# Patient Record
Sex: Female | Born: 1965 | Race: Black or African American | Hispanic: No | Marital: Single | Smoking: Former smoker
Health system: Southern US, Community
[De-identification: ages and names within clinical notes are randomized; demographics above are authoritative.]

## PROBLEM LIST (undated history)

## (undated) DIAGNOSIS — Z21 Asymptomatic human immunodeficiency virus [HIV] infection status: Secondary | ICD-10-CM

## (undated) DIAGNOSIS — M1990 Unspecified osteoarthritis, unspecified site: Secondary | ICD-10-CM

## (undated) DIAGNOSIS — B20 Human immunodeficiency virus [HIV] disease: Secondary | ICD-10-CM

## (undated) DIAGNOSIS — M791 Myalgia, unspecified site: Secondary | ICD-10-CM

## (undated) DIAGNOSIS — M2550 Pain in unspecified joint: Secondary | ICD-10-CM

## (undated) DIAGNOSIS — M25551 Pain in right hip: Secondary | ICD-10-CM

## (undated) DIAGNOSIS — I10 Essential (primary) hypertension: Secondary | ICD-10-CM

## (undated) DIAGNOSIS — M25552 Pain in left hip: Secondary | ICD-10-CM

## (undated) DIAGNOSIS — E7800 Pure hypercholesterolemia, unspecified: Secondary | ICD-10-CM

## (undated) HISTORY — DX: Pain in left hip: M25.552

## (undated) HISTORY — DX: Essential (primary) hypertension: I10

## (undated) HISTORY — DX: Pain in unspecified joint: M25.50

## (undated) HISTORY — DX: Pain in right hip: M25.551

## (undated) HISTORY — DX: Unspecified osteoarthritis, unspecified site: M19.90

## (undated) HISTORY — DX: Myalgia, unspecified site: M79.10

---

## 1997-07-05 ENCOUNTER — Encounter: Admission: RE | Admit: 1997-07-05 | Discharge: 1997-07-05 | Payer: Self-pay | Admitting: Infectious Diseases

## 1997-07-13 ENCOUNTER — Encounter: Admission: RE | Admit: 1997-07-13 | Discharge: 1997-07-13 | Payer: Self-pay | Admitting: Infectious Diseases

## 1997-08-16 ENCOUNTER — Encounter: Admission: RE | Admit: 1997-08-16 | Discharge: 1997-08-16 | Payer: Self-pay | Admitting: Infectious Diseases

## 1997-09-13 ENCOUNTER — Encounter: Admission: RE | Admit: 1997-09-13 | Discharge: 1997-09-13 | Payer: Self-pay | Admitting: Infectious Diseases

## 1997-11-22 ENCOUNTER — Ambulatory Visit (HOSPITAL_COMMUNITY): Admission: RE | Admit: 1997-11-22 | Discharge: 1997-11-22 | Payer: Self-pay | Admitting: Infectious Diseases

## 1997-11-22 ENCOUNTER — Encounter: Admission: RE | Admit: 1997-11-22 | Discharge: 1997-11-22 | Payer: Self-pay | Admitting: Infectious Diseases

## 2006-11-02 ENCOUNTER — Encounter: Payer: Self-pay | Admitting: Internal Medicine

## 2006-11-02 DIAGNOSIS — B20 Human immunodeficiency virus [HIV] disease: Secondary | ICD-10-CM | POA: Insufficient documentation

## 2006-11-05 ENCOUNTER — Ambulatory Visit: Payer: Self-pay | Admitting: Internal Medicine

## 2006-11-05 ENCOUNTER — Encounter: Admission: RE | Admit: 2006-11-05 | Discharge: 2006-11-05 | Payer: Self-pay | Admitting: Internal Medicine

## 2006-11-05 LAB — CONVERTED CEMR LAB
HIV 1 RNA Quant: 50100 copies/mL — ABNORMAL HIGH (ref <50)
HIV-1 RNA Quant, Log: 4.7 — ABNORMAL HIGH (ref <1.70)

## 2006-11-06 ENCOUNTER — Telehealth: Payer: Self-pay | Admitting: Internal Medicine

## 2006-11-11 LAB — CONVERTED CEMR LAB
ALT: 8 units/L (ref 0–35)
AST: 20 units/L (ref 0–37)
Albumin: 4 g/dL (ref 3.5–5.2)
Alkaline Phosphatase: 55 units/L (ref 39–117)
BUN: 10 mg/dL (ref 6–23)
Bacteria, UA: NONE SEEN
Basophils Absolute: 0 10*3/uL (ref 0.0–0.1)
Basophils Relative: 0 % (ref 0–1)
Bilirubin Urine: NEGATIVE
CO2: 16 meq/L — ABNORMAL LOW (ref 19–32)
Calcium: 8.4 mg/dL (ref 8.4–10.5)
Chlamydia, Swab/Urine, PCR: NEGATIVE
Chloride: 106 meq/L (ref 96–112)
Cholesterol: 131 mg/dL (ref 0–200)
Creatinine, Ser: 0.74 mg/dL (ref 0.40–1.20)
Eosinophils Absolute: 0.1 10*3/uL (ref 0.0–0.7)
Eosinophils Relative: 4 % (ref 0–5)
GC Probe Amp, Urine: NEGATIVE
Glucose, Bld: 93 mg/dL (ref 70–99)
HCT: 23.5 % — ABNORMAL LOW (ref 36.0–46.0)
HCV Ab: NEGATIVE
HDL: 33 mg/dL — ABNORMAL LOW (ref >39)
HIV-1 antibody: POSITIVE — AB
HIV-2 Ab: UNDETERMINED — AB
HIV: REACTIVE
Hemoglobin, Urine: NEGATIVE
Hemoglobin: 6.3 g/dL — CL (ref 12.0–15.0)
Hep B Core Total Ab: NEGATIVE
Hep B S Ab: NEGATIVE
Hepatitis B Surface Ag: NEGATIVE
Ketones, ur: NEGATIVE mg/dL
LDL Cholesterol: 62 mg/dL (ref 0–99)
Lymphocytes Relative: 17 % (ref 12–46)
Lymphs Abs: 0.6 10*3/uL — ABNORMAL LOW (ref 0.7–3.3)
MCHC: 26.8 g/dL — ABNORMAL LOW (ref 30.0–36.0)
MCV: 63.7 fL — ABNORMAL LOW (ref 78.0–100.0)
Monocytes Absolute: 0.3 10*3/uL (ref 0.2–0.7)
Monocytes Relative: 10 % (ref 3–11)
Neutro Abs: 2.2 10*3/uL (ref 1.7–7.7)
Neutrophils Relative %: 69 % (ref 43–77)
Nitrite: NEGATIVE
Platelets: 423 10*3/uL — ABNORMAL HIGH (ref 150–400)
Potassium: 4 meq/L (ref 3.5–5.3)
Protein, ur: NEGATIVE mg/dL
RBC: 3.69 M/uL — ABNORMAL LOW (ref 3.87–5.11)
RDW: 21 % — ABNORMAL HIGH (ref 11.5–14.0)
Sodium: 134 meq/L — ABNORMAL LOW (ref 135–145)
Specific Gravity, Urine: 1.019 (ref 1.005–1.03)
Total Bilirubin: 0.2 mg/dL — ABNORMAL LOW (ref 0.3–1.2)
Total CHOL/HDL Ratio: 4
Total Protein: 10.2 g/dL — ABNORMAL HIGH (ref 6.0–8.3)
Triglycerides: 178 mg/dL — ABNORMAL HIGH (ref <150)
Urine Glucose: NEGATIVE mg/dL
Urobilinogen, UA: 0.2 (ref 0.0–1.0)
VLDL: 36 mg/dL (ref 0–40)
WBC: 3.2 10*3/uL — ABNORMAL LOW (ref 4.0–10.5)
pH: 5.5 (ref 5.0–8.0)

## 2006-11-17 ENCOUNTER — Telehealth: Payer: Self-pay | Admitting: Internal Medicine

## 2006-11-18 ENCOUNTER — Encounter: Payer: Self-pay | Admitting: Internal Medicine

## 2006-11-23 ENCOUNTER — Encounter (INDEPENDENT_AMBULATORY_CARE_PROVIDER_SITE_OTHER): Payer: Self-pay | Admitting: *Deleted

## 2006-11-26 ENCOUNTER — Telehealth: Payer: Self-pay | Admitting: Internal Medicine

## 2006-11-27 ENCOUNTER — Encounter: Payer: Self-pay | Admitting: Internal Medicine

## 2006-11-27 ENCOUNTER — Ambulatory Visit: Payer: Self-pay | Admitting: Internal Medicine

## 2006-11-27 DIAGNOSIS — D509 Iron deficiency anemia, unspecified: Secondary | ICD-10-CM

## 2006-12-02 ENCOUNTER — Encounter: Payer: Self-pay | Admitting: Internal Medicine

## 2006-12-03 ENCOUNTER — Encounter (INDEPENDENT_AMBULATORY_CARE_PROVIDER_SITE_OTHER): Payer: Self-pay | Admitting: *Deleted

## 2006-12-03 LAB — CONVERTED CEMR LAB: Pap Smear: NORMAL

## 2006-12-11 ENCOUNTER — Encounter: Payer: Self-pay | Admitting: Internal Medicine

## 2006-12-11 ENCOUNTER — Encounter (INDEPENDENT_AMBULATORY_CARE_PROVIDER_SITE_OTHER): Payer: Self-pay | Admitting: *Deleted

## 2007-01-06 ENCOUNTER — Ambulatory Visit: Payer: Self-pay | Admitting: Internal Medicine

## 2007-01-06 ENCOUNTER — Encounter: Admission: RE | Admit: 2007-01-06 | Discharge: 2007-01-06 | Payer: Self-pay | Admitting: Internal Medicine

## 2007-01-06 LAB — CONVERTED CEMR LAB
HIV 1 RNA Quant: 844 copies/mL — ABNORMAL HIGH (ref <50)
HIV-1 RNA Quant, Log: 2.93 — ABNORMAL HIGH (ref <1.70)

## 2007-01-07 LAB — CONVERTED CEMR LAB
ALT: 10 units/L (ref 0–35)
AST: 17 units/L (ref 0–37)
Albumin: 3.7 g/dL (ref 3.5–5.2)
Alkaline Phosphatase: 58 units/L (ref 39–117)
BUN: 11 mg/dL (ref 6–23)
Basophils Absolute: 0 10*3/uL (ref 0.0–0.1)
Basophils Relative: 0 % (ref 0–1)
CO2: 19 meq/L (ref 19–32)
Calcium: 8.3 mg/dL — ABNORMAL LOW (ref 8.4–10.5)
Chloride: 108 meq/L (ref 96–112)
Creatinine, Ser: 0.86 mg/dL (ref 0.40–1.20)
Eosinophils Absolute: 0.1 10*3/uL — ABNORMAL LOW (ref 0.2–0.7)
Eosinophils Relative: 3 % (ref 0–5)
Glucose, Bld: 80 mg/dL (ref 70–99)
HCT: 29.3 % — ABNORMAL LOW (ref 36.0–46.0)
Hemoglobin: 8.5 g/dL — ABNORMAL LOW (ref 12.0–15.0)
Lymphocytes Relative: 31 % (ref 12–46)
Lymphs Abs: 0.9 10*3/uL (ref 0.7–4.0)
MCHC: 29 g/dL — ABNORMAL LOW (ref 30.0–36.0)
MCV: 77.5 fL — ABNORMAL LOW (ref 78.0–100.0)
Monocytes Absolute: 0.3 10*3/uL (ref 0.1–1.0)
Monocytes Relative: 11 % (ref 3–12)
Neutro Abs: 1.7 10*3/uL (ref 1.7–7.7)
Neutrophils Relative %: 56 % (ref 43–77)
Platelets: 288 10*3/uL (ref 150–400)
Potassium: 3.4 meq/L — ABNORMAL LOW (ref 3.5–5.3)
RBC: 3.78 M/uL — ABNORMAL LOW (ref 3.87–5.11)
Sodium: 138 meq/L (ref 135–145)
Total Bilirubin: 0.2 mg/dL — ABNORMAL LOW (ref 0.3–1.2)
Total Protein: 8.5 g/dL — ABNORMAL HIGH (ref 6.0–8.3)
WBC: 3.1 10*3/uL — ABNORMAL LOW (ref 4.0–10.5)

## 2007-01-20 ENCOUNTER — Ambulatory Visit: Payer: Self-pay | Admitting: Internal Medicine

## 2007-01-20 ENCOUNTER — Encounter: Payer: Self-pay | Admitting: Internal Medicine

## 2007-01-20 LAB — CONVERTED CEMR LAB
ALT: 8 units/L (ref 0–35)
AST: 18 units/L (ref 0–37)
Albumin: 4.2 g/dL (ref 3.5–5.2)
Alkaline Phosphatase: 68 units/L (ref 39–117)
Bilirubin, Direct: <0.1 mg/dL (ref 0.0–0.3)
CD4 Count: 230 microliters
HIV 1 RNA Quant: 64 copies/mL
Preg, Serum: NEGATIVE
Total Bilirubin: 0.2 mg/dL — ABNORMAL LOW (ref 0.3–1.2)
Total Protein: 9.4 g/dL — ABNORMAL HIGH (ref 6.0–8.3)

## 2007-02-01 ENCOUNTER — Encounter (INDEPENDENT_AMBULATORY_CARE_PROVIDER_SITE_OTHER): Payer: Self-pay | Admitting: *Deleted

## 2007-02-02 ENCOUNTER — Encounter (INDEPENDENT_AMBULATORY_CARE_PROVIDER_SITE_OTHER): Payer: Self-pay | Admitting: *Deleted

## 2007-02-08 ENCOUNTER — Encounter: Payer: Self-pay | Admitting: Internal Medicine

## 2007-02-10 ENCOUNTER — Encounter: Payer: Self-pay | Admitting: Internal Medicine

## 2007-02-19 ENCOUNTER — Ambulatory Visit: Payer: Self-pay | Admitting: Internal Medicine

## 2007-02-20 ENCOUNTER — Encounter: Payer: Self-pay | Admitting: Internal Medicine

## 2007-03-01 ENCOUNTER — Encounter: Payer: Self-pay | Admitting: Internal Medicine

## 2007-03-18 ENCOUNTER — Ambulatory Visit: Payer: Self-pay | Admitting: Internal Medicine

## 2007-04-20 ENCOUNTER — Ambulatory Visit: Payer: Self-pay | Admitting: Internal Medicine

## 2007-04-20 LAB — CONVERTED CEMR LAB
ALT: 12 units/L (ref 0–35)
AST: 14 units/L (ref 0–37)
Albumin: 4.4 g/dL (ref 3.5–5.2)
Alkaline Phosphatase: 55 units/L (ref 39–117)
BUN: 11 mg/dL (ref 6–23)
Beta hcg, urine, semiquantitative: NEGATIVE
CD4 Count: 235 microliters
CO2: 19 meq/L (ref 19–32)
Calcium: 9.2 mg/dL (ref 8.4–10.5)
Chloride: 104 meq/L (ref 96–112)
Creatinine, Ser: 0.88 mg/dL (ref 0.40–1.20)
Glucose, Bld: 90 mg/dL (ref 70–99)
HIV 1 RNA Quant: 97 copies/mL — ABNORMAL HIGH (ref <50)
HIV-1 RNA Quant, Log: 1.99 — ABNORMAL HIGH (ref <1.70)
Potassium: 4 meq/L (ref 3.5–5.3)
Sodium: 137 meq/L (ref 135–145)
Total Bilirubin: 0.2 mg/dL — ABNORMAL LOW (ref 0.3–1.2)
Total Protein: 8.8 g/dL — ABNORMAL HIGH (ref 6.0–8.3)

## 2007-05-05 ENCOUNTER — Encounter: Payer: Self-pay | Admitting: Internal Medicine

## 2007-05-05 ENCOUNTER — Ambulatory Visit: Payer: Self-pay | Admitting: Internal Medicine

## 2007-05-05 ENCOUNTER — Encounter (INDEPENDENT_AMBULATORY_CARE_PROVIDER_SITE_OTHER): Payer: Self-pay | Admitting: *Deleted

## 2007-05-05 DIAGNOSIS — R21 Rash and other nonspecific skin eruption: Secondary | ICD-10-CM | POA: Insufficient documentation

## 2007-05-05 LAB — CONVERTED CEMR LAB
ALT: 9 units/L (ref 0–35)
AST: 14 units/L (ref 0–37)
Albumin: 4.5 g/dL (ref 3.5–5.2)
Alkaline Phosphatase: 53 units/L (ref 39–117)
BUN: 13 mg/dL (ref 6–23)
Bilirubin, Direct: 0.1 mg/dL (ref 0.0–0.3)
CD4 Count: 281 microliters
CO2: 21 meq/L (ref 19–32)
Calcium: 8.8 mg/dL (ref 8.4–10.5)
Chloride: 105 meq/L (ref 96–112)
Creatinine, Ser: 0.86 mg/dL (ref 0.40–1.20)
Glucose, Bld: 91 mg/dL (ref 70–99)
HIV 1 RNA Quant: 49 copies/mL
Indirect Bilirubin: 0.1 mg/dL (ref 0.0–0.9)
Potassium: 4.3 meq/L (ref 3.5–5.3)
Sodium: 136 meq/L (ref 135–145)
Total Bilirubin: 0.2 mg/dL — ABNORMAL LOW (ref 0.3–1.2)
Total Protein: 8.4 g/dL — ABNORMAL HIGH (ref 6.0–8.3)

## 2007-05-06 ENCOUNTER — Telehealth: Payer: Self-pay | Admitting: Internal Medicine

## 2007-06-10 ENCOUNTER — Encounter (INDEPENDENT_AMBULATORY_CARE_PROVIDER_SITE_OTHER): Payer: Self-pay | Admitting: *Deleted

## 2007-08-11 ENCOUNTER — Ambulatory Visit: Payer: Self-pay | Admitting: Internal Medicine

## 2007-09-01 ENCOUNTER — Ambulatory Visit: Payer: Self-pay | Admitting: Internal Medicine

## 2007-09-01 LAB — CONVERTED CEMR LAB
ALT: 9 units/L (ref 0–35)
AST: 13 units/L (ref 0–37)
Albumin: 4.7 g/dL (ref 3.5–5.2)
Alkaline Phosphatase: 59 units/L (ref 39–117)
BUN: 16 mg/dL (ref 6–23)
Bilirubin, Direct: 0.1 mg/dL (ref 0.0–0.3)
CD4 Count: 318 microliters
CO2: 20 meq/L (ref 19–32)
Calcium: 9.1 mg/dL (ref 8.4–10.5)
Chloride: 106 meq/L (ref 96–112)
Creatinine, Ser: 0.88 mg/dL (ref 0.40–1.20)
Glucose, Bld: 95 mg/dL (ref 70–99)
HIV 1 RNA Quant: 49 copies/mL
Indirect Bilirubin: 0.1 mg/dL (ref 0.0–0.9)
Potassium: 4.2 meq/L (ref 3.5–5.3)
Sodium: 136 meq/L (ref 135–145)
Total Bilirubin: 0.2 mg/dL — ABNORMAL LOW (ref 0.3–1.2)
Total Protein: 8.6 g/dL — ABNORMAL HIGH (ref 6.0–8.3)

## 2007-10-07 ENCOUNTER — Encounter: Payer: Self-pay | Admitting: Internal Medicine

## 2007-11-30 ENCOUNTER — Encounter (INDEPENDENT_AMBULATORY_CARE_PROVIDER_SITE_OTHER): Payer: Self-pay | Admitting: *Deleted

## 2007-11-30 ENCOUNTER — Ambulatory Visit: Payer: Self-pay | Admitting: Internal Medicine

## 2007-11-30 LAB — CONVERTED CEMR LAB
ALT: 11 units/L (ref 0–35)
AST: 19 units/L (ref 0–37)
Albumin: 4.5 g/dL (ref 3.5–5.2)
Alkaline Phosphatase: 72 units/L (ref 39–117)
BUN: 13 mg/dL (ref 6–23)
Basophils Absolute: 0 10*3/uL (ref 0.0–0.1)
Basophils Relative: 0 % (ref 0–1)
CO2: 20 meq/L (ref 19–32)
Calcium: 8.8 mg/dL (ref 8.4–10.5)
Chloride: 106 meq/L (ref 96–112)
Creatinine, Ser: 0.87 mg/dL (ref 0.40–1.20)
Eosinophils Absolute: 0.1 10*3/uL (ref 0.0–0.7)
Eosinophils Relative: 3 % (ref 0–5)
Glucose, Bld: 101 mg/dL — ABNORMAL HIGH (ref 70–99)
HCT: 29.9 % — ABNORMAL LOW (ref 36.0–46.0)
HIV 1 RNA Quant: 153 copies/mL — ABNORMAL HIGH (ref <50)
HIV-1 RNA Quant, Log: 2.18 — ABNORMAL HIGH (ref <1.70)
Hemoglobin: 9.8 g/dL — ABNORMAL LOW (ref 12.0–15.0)
Lymphocytes Relative: 43 % (ref 12–46)
Lymphs Abs: 1.3 10*3/uL (ref 0.7–4.0)
MCHC: 32.8 g/dL (ref 30.0–36.0)
MCV: 83.8 fL (ref 78.0–100.0)
Monocytes Absolute: 0.4 10*3/uL (ref 0.1–1.0)
Monocytes Relative: 12 % (ref 3–12)
Neutro Abs: 1.3 10*3/uL — ABNORMAL LOW (ref 1.7–7.7)
Neutrophils Relative %: 42 % — ABNORMAL LOW (ref 43–77)
Platelets: 309 10*3/uL (ref 150–400)
Potassium: 4 meq/L (ref 3.5–5.3)
RBC: 3.57 M/uL — ABNORMAL LOW (ref 3.87–5.11)
RDW: 16.2 % — ABNORMAL HIGH (ref 11.5–15.5)
Sodium: 137 meq/L (ref 135–145)
Total Bilirubin: 0.2 mg/dL — ABNORMAL LOW (ref 0.3–1.2)
Total Protein: 8.4 g/dL — ABNORMAL HIGH (ref 6.0–8.3)
WBC: 3 10*3/uL — ABNORMAL LOW (ref 4.0–10.5)

## 2007-12-14 ENCOUNTER — Ambulatory Visit: Payer: Self-pay | Admitting: Internal Medicine

## 2008-02-01 ENCOUNTER — Ambulatory Visit: Payer: Self-pay | Admitting: Internal Medicine

## 2008-02-16 ENCOUNTER — Encounter: Payer: Self-pay | Admitting: Internal Medicine

## 2008-02-16 ENCOUNTER — Ambulatory Visit: Payer: Self-pay | Admitting: Internal Medicine

## 2008-02-22 ENCOUNTER — Encounter: Payer: Self-pay | Admitting: Internal Medicine

## 2008-03-08 ENCOUNTER — Encounter (INDEPENDENT_AMBULATORY_CARE_PROVIDER_SITE_OTHER): Payer: Self-pay | Admitting: *Deleted

## 2008-03-13 ENCOUNTER — Ambulatory Visit: Payer: Self-pay | Admitting: Internal Medicine

## 2008-03-13 LAB — CONVERTED CEMR LAB
ALT: 16 units/L (ref 0–35)
AST: 29 units/L (ref 0–37)
Albumin: 4.5 g/dL (ref 3.5–5.2)
Alkaline Phosphatase: 78 units/L (ref 39–117)
BUN: 8 mg/dL (ref 6–23)
Basophils Absolute: 0 10*3/uL (ref 0.0–0.1)
Basophils Relative: 0 % (ref 0–1)
CO2: 17 meq/L — ABNORMAL LOW (ref 19–32)
Calcium: 9 mg/dL (ref 8.4–10.5)
Chloride: 107 meq/L (ref 96–112)
Creatinine, Ser: 0.8 mg/dL (ref 0.40–1.20)
Eosinophils Absolute: 0.1 10*3/uL (ref 0.0–0.7)
Eosinophils Relative: 2 % (ref 0–5)
Glucose, Bld: 86 mg/dL (ref 70–99)
HCT: 31.2 % — ABNORMAL LOW (ref 36.0–46.0)
HIV 1 RNA Quant: 103 copies/mL — ABNORMAL HIGH (ref <48)
HIV-1 RNA Quant, Log: 2.01 — ABNORMAL HIGH (ref <1.68)
Hemoglobin: 10 g/dL — ABNORMAL LOW (ref 12.0–15.0)
Lymphocytes Relative: 27 % (ref 12–46)
Lymphs Abs: 1.3 10*3/uL (ref 0.7–4.0)
MCHC: 32.1 g/dL (ref 30.0–36.0)
MCV: 81.5 fL (ref 78.0–100.0)
Monocytes Absolute: 0.4 10*3/uL (ref 0.1–1.0)
Monocytes Relative: 9 % (ref 3–12)
Neutro Abs: 2.9 10*3/uL (ref 1.7–7.7)
Neutrophils Relative %: 62 % (ref 43–77)
Platelets: 258 10*3/uL (ref 150–400)
Potassium: 4.6 meq/L (ref 3.5–5.3)
RBC: 3.83 M/uL — ABNORMAL LOW (ref 3.87–5.11)
RDW: 17.8 % — ABNORMAL HIGH (ref 11.5–15.5)
Sodium: 137 meq/L (ref 135–145)
Total Bilirubin: 0.3 mg/dL (ref 0.3–1.2)
Total Protein: 8.2 g/dL (ref 6.0–8.3)
WBC: 4.7 10*3/uL (ref 4.0–10.5)

## 2008-03-17 ENCOUNTER — Encounter: Payer: Self-pay | Admitting: *Deleted

## 2008-03-29 ENCOUNTER — Ambulatory Visit: Payer: Self-pay | Admitting: Internal Medicine

## 2008-06-07 ENCOUNTER — Ambulatory Visit: Payer: Self-pay | Admitting: Internal Medicine

## 2008-06-07 LAB — CONVERTED CEMR LAB
ALT: 18 units/L (ref 0–35)
AST: 26 units/L (ref 0–37)
Albumin: 4.5 g/dL (ref 3.5–5.2)
Alkaline Phosphatase: 82 units/L (ref 39–117)
BUN: 10 mg/dL (ref 6–23)
Basophils Absolute: 0 10*3/uL (ref 0.0–0.1)
Basophils Relative: 1 % (ref 0–1)
CO2: 18 meq/L — ABNORMAL LOW (ref 19–32)
Calcium: 8.6 mg/dL (ref 8.4–10.5)
Chloride: 111 meq/L (ref 96–112)
Creatinine, Ser: 0.9 mg/dL (ref 0.40–1.20)
Eosinophils Absolute: 0.1 10*3/uL (ref 0.0–0.7)
Eosinophils Relative: 5 % (ref 0–5)
GFR calc Af Amer: >60 mL/min (ref >60)
GFR calc non Af Amer: >60 mL/min (ref >60)
Glucose, Bld: 81 mg/dL (ref 70–99)
HCT: 30.6 % — ABNORMAL LOW (ref 36.0–46.0)
HIV 1 RNA Quant: 174 copies/mL — ABNORMAL HIGH (ref <48)
HIV-1 RNA Quant, Log: 2.24 — ABNORMAL HIGH (ref <1.68)
Hemoglobin: 10 g/dL — ABNORMAL LOW (ref 12.0–15.0)
Lymphocytes Relative: 38 % (ref 12–46)
Lymphs Abs: 1 10*3/uL (ref 0.7–4.0)
MCHC: 32.7 g/dL (ref 30.0–36.0)
MCV: 79.7 fL (ref 78.0–100.0)
Monocytes Absolute: 0.2 10*3/uL (ref 0.1–1.0)
Monocytes Relative: 9 % (ref 3–12)
Neutro Abs: 1.3 10*3/uL — ABNORMAL LOW (ref 1.7–7.7)
Neutrophils Relative %: 48 % (ref 43–77)
Platelets: 265 10*3/uL (ref 150–400)
Potassium: 4.4 meq/L (ref 3.5–5.3)
RBC: 3.84 M/uL — ABNORMAL LOW (ref 3.87–5.11)
RDW: 19.6 % — ABNORMAL HIGH (ref 11.5–15.5)
Sodium: 136 meq/L (ref 135–145)
Total Bilirubin: 0.2 mg/dL — ABNORMAL LOW (ref 0.3–1.2)
Total Protein: 8.5 g/dL — ABNORMAL HIGH (ref 6.0–8.3)
WBC: 2.6 10*3/uL — ABNORMAL LOW (ref 4.0–10.5)

## 2008-06-23 ENCOUNTER — Ambulatory Visit: Payer: Self-pay | Admitting: Internal Medicine

## 2008-07-07 ENCOUNTER — Ambulatory Visit: Payer: Self-pay | Admitting: Internal Medicine

## 2008-09-21 ENCOUNTER — Ambulatory Visit: Payer: Self-pay | Admitting: Internal Medicine

## 2008-09-21 LAB — CONVERTED CEMR LAB
ALT: 10 units/L (ref 0–35)
AST: 23 units/L (ref 0–37)
Albumin: 4.4 g/dL (ref 3.5–5.2)
Alkaline Phosphatase: 70 units/L (ref 39–117)
BUN: 10 mg/dL (ref 6–23)
Basophils Absolute: 0 10*3/uL (ref 0.0–0.1)
Basophils Relative: 1 % (ref 0–1)
CO2: 19 meq/L (ref 19–32)
Calcium: 8.4 mg/dL (ref 8.4–10.5)
Chloride: 106 meq/L (ref 96–112)
Cholesterol: 203 mg/dL — ABNORMAL HIGH (ref 0–200)
Creatinine, Ser: 0.85 mg/dL (ref 0.40–1.20)
Eosinophils Absolute: 0.1 10*3/uL (ref 0.0–0.7)
Eosinophils Relative: 1 % (ref 0–5)
Glucose, Bld: 90 mg/dL (ref 70–99)
HCT: 31.4 % — ABNORMAL LOW (ref 36.0–46.0)
HDL: 81 mg/dL (ref >39)
HIV 1 RNA Quant: <48 copies/mL (ref <48)
HIV-1 RNA Quant, Log: <1.68 (ref <1.68)
Hemoglobin: 10 g/dL — ABNORMAL LOW (ref 12.0–15.0)
LDL Cholesterol: 91 mg/dL (ref 0–99)
Lymphocytes Relative: 34 % (ref 12–46)
Lymphs Abs: 1.3 10*3/uL (ref 0.7–4.0)
MCHC: 31.8 g/dL (ref 30.0–36.0)
MCV: 82.2 fL (ref >=78.0)
Monocytes Absolute: 0.3 10*3/uL (ref 0.1–1.0)
Monocytes Relative: 7 % (ref 3–12)
Neutro Abs: 2.2 10*3/uL (ref 1.7–7.7)
Neutrophils Relative %: 58 % (ref 43–77)
Platelets: 329 10*3/uL (ref 150–400)
Potassium: 4.1 meq/L (ref 3.5–5.3)
RBC: 3.82 M/uL — ABNORMAL LOW (ref 3.87–5.11)
RDW: 18.2 % — ABNORMAL HIGH (ref 11.5–15.5)
Sodium: 139 meq/L (ref 135–145)
Total Bilirubin: 0.2 mg/dL — ABNORMAL LOW (ref 0.3–1.2)
Total CHOL/HDL Ratio: 2.5
Total Protein: 8.4 g/dL — ABNORMAL HIGH (ref 6.0–8.3)
Triglycerides: 157 mg/dL — ABNORMAL HIGH (ref <150)
VLDL: 31 mg/dL (ref 0–40)
WBC: 3.8 10*3/uL — ABNORMAL LOW (ref 4.0–10.5)

## 2008-10-06 ENCOUNTER — Ambulatory Visit: Payer: Self-pay | Admitting: Internal Medicine

## 2008-10-06 DIAGNOSIS — R030 Elevated blood-pressure reading, without diagnosis of hypertension: Secondary | ICD-10-CM

## 2008-11-15 ENCOUNTER — Telehealth (INDEPENDENT_AMBULATORY_CARE_PROVIDER_SITE_OTHER): Payer: Self-pay | Admitting: *Deleted

## 2008-12-13 ENCOUNTER — Telehealth (INDEPENDENT_AMBULATORY_CARE_PROVIDER_SITE_OTHER): Payer: Self-pay | Admitting: *Deleted

## 2009-01-11 ENCOUNTER — Telehealth (INDEPENDENT_AMBULATORY_CARE_PROVIDER_SITE_OTHER): Payer: Self-pay | Admitting: *Deleted

## 2009-01-30 ENCOUNTER — Encounter: Payer: Self-pay | Admitting: Internal Medicine

## 2009-02-07 ENCOUNTER — Telehealth (INDEPENDENT_AMBULATORY_CARE_PROVIDER_SITE_OTHER): Payer: Self-pay | Admitting: *Deleted

## 2009-03-02 ENCOUNTER — Encounter: Payer: Self-pay | Admitting: Internal Medicine

## 2009-03-07 ENCOUNTER — Telehealth (INDEPENDENT_AMBULATORY_CARE_PROVIDER_SITE_OTHER): Payer: Self-pay | Admitting: *Deleted

## 2009-03-21 ENCOUNTER — Encounter: Payer: Self-pay | Admitting: Internal Medicine

## 2009-04-10 ENCOUNTER — Telehealth (INDEPENDENT_AMBULATORY_CARE_PROVIDER_SITE_OTHER): Payer: Self-pay | Admitting: *Deleted

## 2009-04-27 ENCOUNTER — Ambulatory Visit: Payer: Self-pay | Admitting: Internal Medicine

## 2009-04-27 LAB — CONVERTED CEMR LAB
ALT: 20 units/L (ref 0–35)
AST: 26 units/L (ref 0–37)
Albumin: 4 g/dL (ref 3.5–5.2)
Alkaline Phosphatase: 61 units/L (ref 39–117)
BUN: 10 mg/dL (ref 6–23)
Basophils Absolute: 0 10*3/uL (ref 0.0–0.1)
Basophils Relative: 0 % (ref 0–1)
CO2: 20 meq/L (ref 19–32)
Calcium: 8.9 mg/dL (ref 8.4–10.5)
Chloride: 101 meq/L (ref 96–112)
Creatinine, Ser: 0.74 mg/dL (ref 0.40–1.20)
Eosinophils Absolute: 0.1 10*3/uL (ref 0.0–0.7)
Eosinophils Relative: 2 % (ref 0–5)
Glucose, Bld: 96 mg/dL (ref 70–99)
HCT: 31.8 % — ABNORMAL LOW (ref 36.0–46.0)
HIV 1 RNA Quant: 90300 copies/mL — ABNORMAL HIGH (ref <48)
HIV-1 RNA Quant, Log: 4.96 — ABNORMAL HIGH (ref <1.68)
Hemoglobin: 10.7 g/dL — ABNORMAL LOW (ref 12.0–15.0)
Lymphocytes Relative: 25 % (ref 12–46)
Lymphs Abs: 0.7 10*3/uL (ref 0.7–4.0)
MCHC: 33.6 g/dL (ref 30.0–36.0)
MCV: 79.9 fL (ref >=78.0)
Monocytes Absolute: 0.3 10*3/uL (ref 0.1–1.0)
Monocytes Relative: 11 % (ref 3–12)
Neutro Abs: 1.6 10*3/uL — ABNORMAL LOW (ref 1.7–7.7)
Neutrophils Relative %: 62 % (ref 43–77)
Platelets: 284 10*3/uL (ref 150–400)
Potassium: 3.9 meq/L (ref 3.5–5.3)
RBC: 3.98 M/uL (ref 3.87–5.11)
RDW: 18.7 % — ABNORMAL HIGH (ref 11.5–15.5)
Sodium: 134 meq/L — ABNORMAL LOW (ref 135–145)
Total Bilirubin: 0.3 mg/dL (ref 0.3–1.2)
Total Protein: 9 g/dL — ABNORMAL HIGH (ref 6.0–8.3)
WBC: 2.7 10*3/uL — ABNORMAL LOW (ref 4.0–10.5)

## 2009-04-30 ENCOUNTER — Encounter (INDEPENDENT_AMBULATORY_CARE_PROVIDER_SITE_OTHER): Payer: Self-pay | Admitting: *Deleted

## 2009-05-24 ENCOUNTER — Encounter (INDEPENDENT_AMBULATORY_CARE_PROVIDER_SITE_OTHER): Payer: Self-pay | Admitting: *Deleted

## 2009-06-01 ENCOUNTER — Encounter (INDEPENDENT_AMBULATORY_CARE_PROVIDER_SITE_OTHER): Payer: Self-pay | Admitting: *Deleted

## 2009-08-01 ENCOUNTER — Ambulatory Visit: Payer: Self-pay | Admitting: Internal Medicine

## 2009-08-07 ENCOUNTER — Telehealth: Payer: Self-pay | Admitting: Internal Medicine

## 2009-09-03 ENCOUNTER — Telehealth: Payer: Self-pay | Admitting: Internal Medicine

## 2009-09-12 ENCOUNTER — Ambulatory Visit: Payer: Self-pay | Admitting: Internal Medicine

## 2009-09-12 LAB — CONVERTED CEMR LAB
ALT: 10 units/L (ref 0–35)
AST: 20 units/L (ref 0–37)
Albumin: 4.2 g/dL (ref 3.5–5.2)
Alkaline Phosphatase: 64 units/L (ref 39–117)
BUN: 13 mg/dL (ref 6–23)
Basophils Absolute: 0 10*3/uL (ref 0.0–0.1)
Basophils Relative: 1 % (ref 0–1)
CO2: 18 meq/L — ABNORMAL LOW (ref 19–32)
Calcium: 8.4 mg/dL (ref 8.4–10.5)
Chloride: 105 meq/L (ref 96–112)
Creatinine, Ser: 0.65 mg/dL (ref 0.40–1.20)
Eosinophils Absolute: 0.1 10*3/uL (ref 0.0–0.7)
Eosinophils Relative: 3 % (ref 0–5)
Glucose, Bld: 106 mg/dL — ABNORMAL HIGH (ref 70–99)
HCT: 28.9 % — ABNORMAL LOW (ref 36.0–46.0)
HIV 1 RNA Quant: 205 copies/mL — ABNORMAL HIGH (ref <48)
HIV-1 RNA Quant, Log: 2.31 — ABNORMAL HIGH (ref <1.68)
Hemoglobin: 9.1 g/dL — ABNORMAL LOW (ref 12.0–15.0)
Lymphocytes Relative: 28 % (ref 12–46)
Lymphs Abs: 1.2 10*3/uL (ref 0.7–4.0)
MCHC: 31.5 g/dL (ref 30.0–36.0)
MCV: 76.3 fL — ABNORMAL LOW (ref 78.0–100.0)
Monocytes Absolute: 0.3 10*3/uL (ref 0.1–1.0)
Monocytes Relative: 7 % (ref 3–12)
Neutro Abs: 2.6 10*3/uL (ref 1.7–7.7)
Neutrophils Relative %: 62 % (ref 43–77)
Platelets: 352 10*3/uL (ref 150–400)
Potassium: 3.5 meq/L (ref 3.5–5.3)
RBC: 3.79 M/uL — ABNORMAL LOW (ref 3.87–5.11)
RDW: 18.8 % — ABNORMAL HIGH (ref 11.5–15.5)
Sodium: 134 meq/L — ABNORMAL LOW (ref 135–145)
Total Bilirubin: 0.3 mg/dL (ref 0.3–1.2)
Total Protein: 8.8 g/dL — ABNORMAL HIGH (ref 6.0–8.3)
WBC: 4.2 10*3/uL (ref 4.0–10.5)

## 2009-10-02 ENCOUNTER — Telehealth: Payer: Self-pay | Admitting: Internal Medicine

## 2009-10-02 ENCOUNTER — Telehealth (INDEPENDENT_AMBULATORY_CARE_PROVIDER_SITE_OTHER): Payer: Self-pay | Admitting: *Deleted

## 2009-10-26 ENCOUNTER — Ambulatory Visit: Payer: Self-pay | Admitting: Internal Medicine

## 2009-10-26 ENCOUNTER — Telehealth: Payer: Self-pay | Admitting: Internal Medicine

## 2009-10-26 LAB — CONVERTED CEMR LAB: Pap Smear: NEGATIVE

## 2009-10-31 ENCOUNTER — Encounter: Payer: Self-pay | Admitting: Internal Medicine

## 2009-11-26 ENCOUNTER — Telehealth (INDEPENDENT_AMBULATORY_CARE_PROVIDER_SITE_OTHER): Payer: Self-pay | Admitting: *Deleted

## 2009-11-29 ENCOUNTER — Telehealth (INDEPENDENT_AMBULATORY_CARE_PROVIDER_SITE_OTHER): Payer: Self-pay | Admitting: *Deleted

## 2009-11-29 ENCOUNTER — Ambulatory Visit: Payer: Self-pay | Admitting: Internal Medicine

## 2009-11-29 LAB — CONVERTED CEMR LAB
ALT: <8 units/L (ref 0–35)
AST: 16 units/L (ref 0–37)
Albumin: 3.8 g/dL (ref 3.5–5.2)
Alkaline Phosphatase: 57 units/L (ref 39–117)
BUN: 8 mg/dL (ref 6–23)
Basophils Absolute: 0 10*3/uL (ref 0.0–0.1)
Basophils Relative: 0 % (ref 0–1)
CO2: 19 meq/L (ref 19–32)
Calcium: 8.3 mg/dL — ABNORMAL LOW (ref 8.4–10.5)
Chloride: 106 meq/L (ref 96–112)
Creatinine, Ser: 0.69 mg/dL (ref 0.40–1.20)
Eosinophils Absolute: 0.2 10*3/uL (ref 0.0–0.7)
Eosinophils Relative: 5 % (ref 0–5)
Glucose, Bld: 101 mg/dL — ABNORMAL HIGH (ref 70–99)
HCT: 25.9 % — ABNORMAL LOW (ref 36.0–46.0)
HIV 1 RNA Quant: 30 copies/mL — ABNORMAL HIGH (ref <20)
HIV-1 RNA Quant, Log: 1.48 — ABNORMAL HIGH (ref <1.30)
Hemoglobin: 8.1 g/dL — ABNORMAL LOW (ref 12.0–15.0)
Lymphocytes Relative: 39 % (ref 12–46)
Lymphs Abs: 1.5 10*3/uL (ref 0.7–4.0)
MCHC: 31.3 g/dL (ref 30.0–36.0)
MCV: 73.8 fL — ABNORMAL LOW (ref 78.0–100.0)
Monocytes Absolute: 0.3 10*3/uL (ref 0.1–1.0)
Monocytes Relative: 8 % (ref 3–12)
Neutro Abs: 1.8 10*3/uL (ref 1.7–7.7)
Neutrophils Relative %: 48 % (ref 43–77)
Platelets: 386 10*3/uL (ref 150–400)
Potassium: 4.1 meq/L (ref 3.5–5.3)
RBC: 3.51 M/uL — ABNORMAL LOW (ref 3.87–5.11)
RDW: 18.6 % — ABNORMAL HIGH (ref 11.5–15.5)
Sodium: 136 meq/L (ref 135–145)
Total Bilirubin: 0.2 mg/dL — ABNORMAL LOW (ref 0.3–1.2)
Total Protein: 7.5 g/dL (ref 6.0–8.3)
WBC: 3.8 10*3/uL — ABNORMAL LOW (ref 4.0–10.5)

## 2010-01-22 ENCOUNTER — Ambulatory Visit: Payer: Self-pay | Admitting: Internal Medicine

## 2010-02-18 ENCOUNTER — Ambulatory Visit
Admission: RE | Admit: 2010-02-18 | Discharge: 2010-02-18 | Payer: Self-pay | Source: Home / Self Care | Attending: Internal Medicine | Admitting: Internal Medicine

## 2010-02-18 ENCOUNTER — Encounter: Payer: Self-pay | Admitting: Internal Medicine

## 2010-02-18 LAB — CONVERTED CEMR LAB
ALT: <8 units/L (ref 0–35)
AST: 16 units/L (ref 0–37)
Albumin: 4.5 g/dL (ref 3.5–5.2)
Alkaline Phosphatase: 68 units/L (ref 39–117)
BUN: 13 mg/dL (ref 6–23)
Basophils Absolute: 0 10*3/uL (ref 0.0–0.1)
Basophils Relative: 0 % (ref 0–1)
CO2: 21 meq/L (ref 19–32)
Calcium: 8.7 mg/dL (ref 8.4–10.5)
Chloride: 104 meq/L (ref 96–112)
Cholesterol: 200 mg/dL (ref 0–200)
Creatinine, Ser: 0.75 mg/dL (ref 0.40–1.20)
Eosinophils Absolute: 0.2 10*3/uL (ref 0.0–0.7)
Eosinophils Relative: 4 % (ref 0–5)
Glucose, Bld: 98 mg/dL (ref 70–99)
HCT: 26.3 % — ABNORMAL LOW (ref 36.0–46.0)
HDL: 42 mg/dL (ref >39)
HIV 1 RNA Quant: <20 copies/mL (ref <20)
HIV-1 RNA Quant, Log: <1.3 (ref <1.30)
Hemoglobin: 8.5 g/dL — ABNORMAL LOW (ref 12.0–15.0)
LDL Cholesterol: 113 mg/dL — ABNORMAL HIGH (ref 0–99)
Lymphocytes Relative: 30 % (ref 12–46)
Lymphs Abs: 1.7 10*3/uL (ref 0.7–4.0)
MCHC: 32.3 g/dL (ref 30.0–36.0)
MCV: 67.6 fL — ABNORMAL LOW (ref 78.0–100.0)
Monocytes Absolute: 0.6 10*3/uL (ref 0.1–1.0)
Monocytes Relative: 11 % (ref 3–12)
Neutro Abs: 3.1 10*3/uL (ref 1.7–7.7)
Neutrophils Relative %: 55 % (ref 43–77)
Platelets: 436 10*3/uL — ABNORMAL HIGH (ref 150–400)
Potassium: 3.7 meq/L (ref 3.5–5.3)
RBC: 3.89 M/uL (ref 3.87–5.11)
RDW: 17 % — ABNORMAL HIGH (ref 11.5–15.5)
Sodium: 134 meq/L — ABNORMAL LOW (ref 135–145)
Total Bilirubin: 0.1 mg/dL — ABNORMAL LOW (ref 0.3–1.2)
Total CHOL/HDL Ratio: 4.8
Total Protein: 7.8 g/dL (ref 6.0–8.3)
Triglycerides: 226 mg/dL — ABNORMAL HIGH (ref <150)
VLDL: 45 mg/dL — ABNORMAL HIGH (ref 0–40)
WBC: 5.7 10*3/uL (ref 4.0–10.5)

## 2010-02-20 LAB — T-HELPER CELL (CD4) - (RCID CLINIC ONLY)
CD4 % Helper T Cell: 24 % — ABNORMAL LOW (ref 33–55)
CD4 T Cell Abs: 390 uL — ABNORMAL LOW (ref 400–2700)

## 2010-03-03 LAB — CONVERTED CEMR LAB
ALT: 16 units/L (ref 0–35)
ALT: 8 units/L (ref 0–35)
AST: 14 units/L (ref 0–37)
AST: 30 units/L (ref 0–37)
Albumin: 4.3 g/dL (ref 3.5–5.2)
Albumin: 4.6 g/dL (ref 3.5–5.2)
Alkaline Phosphatase: 50 units/L (ref 39–117)
Alkaline Phosphatase: 65 units/L (ref 39–117)
BUN: 17 mg/dL (ref 6–23)
BUN: 8 mg/dL (ref 6–23)
BUN: 9 mg/dL (ref 6–23)
Beta hcg, urine, semiquantitative: NEGATIVE
Bilirubin, Direct: <0.1 mg/dL (ref 0.0–0.3)
CD4 Count: 180 microliters
CD4 Count: 207 microliters
CD4 Count: 291 microliters
CD4 Count: 298 microliters
CD4 Count: 310 microliters
CO2: 18 meq/L — ABNORMAL LOW (ref 19–32)
CO2: 19 meq/L (ref 19–32)
CO2: 19 meq/L (ref 19–32)
Calcium: 8.8 mg/dL (ref 8.4–10.5)
Calcium: 8.9 mg/dL (ref 8.4–10.5)
Calcium: 9.2 mg/dL (ref 8.4–10.5)
Chloride: 103 meq/L (ref 96–112)
Chloride: 105 meq/L (ref 96–112)
Chloride: 106 meq/L (ref 96–112)
Creatinine, Ser: 0.93 mg/dL (ref 0.40–1.20)
Creatinine, Ser: 0.95 mg/dL (ref 0.40–1.20)
Creatinine, Ser: 0.98 mg/dL (ref 0.40–1.20)
Glucose, Bld: 102 mg/dL — ABNORMAL HIGH (ref 70–99)
Glucose, Bld: 103 mg/dL — ABNORMAL HIGH (ref 70–99)
Glucose, Bld: 97 mg/dL (ref 70–99)
HIV 1 RNA Quant: 49 copies/mL
HIV 1 RNA Quant: 49 copies/mL
HIV 1 RNA Quant: 49 copies/mL
HIV 1 RNA Quant: 49 copies/mL
HIV 1 RNA Quant: <50 copies/mL (ref <50)
HIV-1 RNA Quant, Log: <1.7 (ref <1.70)
Potassium: 4.2 meq/L (ref 3.5–5.3)
Potassium: 4.4 meq/L (ref 3.5–5.3)
Potassium: 4.5 meq/L (ref 3.5–5.3)
Sodium: 131 meq/L — ABNORMAL LOW (ref 135–145)
Sodium: 136 meq/L (ref 135–145)
Sodium: 137 meq/L (ref 135–145)
Total Bilirubin: 0.1 mg/dL — ABNORMAL LOW (ref 0.3–1.2)
Total Bilirubin: 0.3 mg/dL (ref 0.3–1.2)
Total Protein: 8.3 g/dL (ref 6.0–8.3)
Total Protein: 9 g/dL — ABNORMAL HIGH (ref 6.0–8.3)

## 2010-03-05 NOTE — Miscellaneous (Signed)
Summary: letter  Clinical Lists Changes a letter dated 03/05/08 verifying that this pt is getting help from [facility]. to office to see if we save or shred this.Golden Circle RN  November 21, 2009 2:47 PM

## 2010-03-05 NOTE — Letter (Signed)
Summary: Results Follow-up Letter  [facility]  [address]   [place], [place] [address]   Phone: [phone]  Fax: [phone]          October 31, 2009  [address] [place], [place]  [address]  Dear Ms. SCHUBACH,   The following are the results of your recent test(s):  Test     Result     Pap Smear    Normal_XXX___  Not Normal_____       Comments:  Everything was normal.  I will see you in one year for your next PAP smear.    Thank you for coming to the Center for your care.  Sincerely,    Jennet Maduro [facility]

## 2010-03-05 NOTE — Progress Notes (Signed)
Summary: NCADAP/pt assist med arrived for Jan  Phone Note Refill Request      Prescriptions: ATRIPLA 600-200-300 MG  TABS (EFAVIRENZ-EMTRICITAB-TENOFOVIR) Take 1 tablet by mouth at bedtime  #30 x 0   Entered by:   Paulo Fruit  BS,CPht II,MPH   Authorized by:   Yisroel Ramming MD   Signed by:   Paulo Fruit  BS,CPht II,MPH on 02/07/2009   Method used:   Samples Given   RxID:   [id number]   Patient Assist Medication Verification: Medication: Atripla Lot# [id number] Exp Date:06 2013 Tech approval:MLD **Patient coming in for labs. Note sent to self to remind lab to send patient over to pick up meds** Paulo Fruit  BS,CPht II,MPH  February 07, 2009 4:42 PM

## 2010-03-05 NOTE — Progress Notes (Signed)
Summary: [facility] CM, needs appt. w/ Dr. Philipp Deputy and for PAP smear  Phone Note Outgoing Call   Call placed by: Jennet Maduro RN,  October 02, 2009 4:54 PM Call placed to: Jasmine December @ [facility] Action Taken: Phone Call Completed Summary of Call: Pt. needs to make f/u appt. w/ Dr. Philipp Deputy and for PAP smear. Jennet Maduro RN  October 02, 2009 4:54 PM

## 2010-03-05 NOTE — Assessment & Plan Note (Signed)
Summary: f/u [mkj]   CC:  follow-up visit, needs labs, and needs RX for iron and Ensure.  History of Present Illness: Pt has been feeling well. She has been taking her HIV meds regularly. 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  She has not been taking her Iron.  Preventive Screening-Counseling & Management  Alcohol-Tobacco     Alcohol drinks/day: 0     Alcohol type: beer     Smoking Status: current     Packs/Day: <0.25     Passive Smoke Exposure: yes  Caffeine-Diet-Exercise     Caffeine use/day: occasional     Does Patient Exercise: yes     Type of exercise: walking     Exercise (avg: min/session): >60     Times/week: 6  Safety-Violence-Falls     Seat Belt Use: yes      Sexual History:  n/a.        Drug Use:  former and yes.    Comments: pt. declined condoms   Updated Prior Medication List: ATRIPLA 600-200-300 MG  TABS (EFAVIRENZ-EMTRICITAB-TENOFOVIR) Take 1 tablet by mouth at bedtime NU-IRON 150 MG  CAPS (POLYSACCHARIDE IRON COMPLEX) Take 1 tablet by mouth two times a day ENSURE  LIQD (NUTRITIONAL SUPPLEMENTS) one can two times a day BACTRIM DS 800-160 MG TABS (SULFAMETHOXAZOLE-TRIMETHOPRIM) Take 1 tablet by mouth once a day  Current Allergies (reviewed today): No known allergies  Past History:  Past Medical History: Last updated: 11/27/2006 Anemia-iron deficiency HIV disease  Review of Systems  The patient denies anorexia, fever, and weight loss.    Vital Signs:  Patient profile:   45 year old female Height:      67 inches (170.18 cm) Weight:      152.8 pounds (69.45 kg) BMI:     24.02 Temp:     98.2 degrees F (36.78 degrees C) oral Pulse rate:   88 / minute BP sitting:   138 / 83  (right arm)  Vitals Entered By: Wendall Mola CMA Duncan Dull) (October 26, 2009 10:29 AM) CC: follow-up visit, needs labs, needs RX for iron and Ensure Is Patient Diabetic? No Pain Assessment Patient in pain? no       Nutritional Status BMI of 19 -24 = normal Nutritional Status Detail appetite "normal"  Does patient need assistance? Functional Status Self care Ambulation Normal Comments pt. missed one dose of Atripla since last visit   Physical Exam  General:  alert, well-developed, well-nourished, and well-hydrated.   Head:  normocephalic and atraumatic.   Mouth:  pharynx pink and moist.   Lungs:  normal breath sounds.      Impression & Recommendations:  Problem # 1:  HIV INFECTION (ICD-042) Pt.s most recent CD4ct was 230 and VL 205  .  Pt instructed to continue the current antiretroviral regimen.  Pt encouraged to take medication regularly and not miss doses.  Pt will f/u in 3 months for repeat blood work and will see me 2 weeks later. Influenza vaccine today. PAP today.  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 Patient Level III ([address])Future Orders: T-CBC w/Diff ([id number]) ... 12/17/2009 T-CD4SP ([facility]) (CD4SP) ... 12/17/2009 T-Comprehensive Metabolic Panel [phone]) .Marland KitchenMarland Kitchen  12/17/2009 T-HIV Viral Load [phone]) ... 12/17/2009  Problem # 2:  ANEMIA-IRON DEFICIENCY (ICD-280.9) re-start Iron Her updated medication list for this problem includes:    Nu-iron 150 Mg Caps (Polysaccharide iron complex) .Marland Kitchen... Take 1 tablet by mouth two times a day  Other Orders: Influenza Vaccine NON MCR (62952)  Patient Instructions: 1)  Follow-up in November 2 weeks after labs    Immunizations Administered:  Influenza Vaccine # 1:    Vaccine Type: Fluvax Non-MCR    Site: right deltoid    Mfr: Novartis    Dose: 0.5 ml    Route: IM    Given by: Wendall Mola CMA ( AAMA)    Exp. Date: 05/05/2010    Lot #: [id number]    VIS  given: 08/28/09 version given October 26, 2009.  Flu Vaccine Consent Questions:    Do you have a history of severe allergic reactions to this vaccine? no    Any prior history of allergic reactions to egg and/or gelatin? no    Do you have a sensitivity to the preservative Thimersol? no    Do you have a past history of Guillan-Barre Syndrome? no    Do you currently have an acute febrile illness? no    Have you ever had a severe reaction to latex? no    Vaccine information given and explained to patient? yes    Are you currently pregnant? no

## 2010-03-05 NOTE — Progress Notes (Signed)
Summary: NCADAP/pt assist med arrived for Mar  Phone Note Refill Request      Prescriptions: ATRIPLA 600-200-300 MG  TABS (EFAVIRENZ-EMTRICITAB-TENOFOVIR) Take 1 tablet by mouth at bedtime  #30 x 0   Entered by:   Paulo Fruit  BS,CPht II,MPH   Authorized by:   Yisroel Ramming MD   Signed by:   Paulo Fruit  BS,CPht II,MPH on 04/10/2009   Method used:   Samples Given   RxID:   [id number]   Patient Assist Medication Verification: Medication: Atripla Lot# [id number] Exp Date:07 2013 Tech approval:MLD **patient has not picked up several months worth of medications.  Previously spoke to her case manager [facility] office** Paulo Fruit  BS,CPht II,MPH  April 10, 2009 4:57 PM

## 2010-03-05 NOTE — Progress Notes (Signed)
Summary: picked up meds  Phone Note Call from Patient   Caller: Patient Summary of Call: Patient picked up meds on 11-29-09. Initial call taken by: Kathi Simpers [facility]),  November 29, 2009 11:40 AM

## 2010-03-05 NOTE — Miscellaneous (Signed)
Summary: Orders Update - [facility]  Clinical Lists Changes  Orders: Added new Referral order of Misc. Referral (Misc. Ref) - Signed 

## 2010-03-05 NOTE — Letter (Signed)
Summary: [facility]: Medical Case Mgt. Referral  [facility]: Medical Case Mgt. Referral   Imported By: Florinda Marker 03/22/2009 14:52:18  _____________________________________________________________________  External Attachment:    Type:   Image     Comment:   External Document

## 2010-03-05 NOTE — Miscellaneous (Signed)
Summary: clinical update/ryan whtie [place] app completed  Clinical Lists Changes  Observations: Added new observation of FINASSESSDT: 04/27/2009 (04/30/2009 9:41) Added new observation of HOUSING: Stable/permanent (04/30/2009 9:41) Added new observation of RW VITAL STA: Active (04/30/2009 9:41)

## 2010-03-05 NOTE — Progress Notes (Signed)
Summary: pt. notified ADAP meds arrived  Phone Note Outgoing Call   Call placed by: Annice Pih Summary of Call: Pt. left message that ADAP meds have arrived for October, Atripla and Bactrim Initial call taken by: Wendall Mola CMA Duncan Dull),  November 26, 2009 11:22 AM

## 2010-03-05 NOTE — Miscellaneous (Signed)
Summary: clinical update/ryan white NCADAP apprv til 05/04/10  Clinical Lists Changes  Observations: Added new observation of AIDSDAP: Yes 2011 (05/24/2009 11:23)

## 2010-03-05 NOTE — Progress Notes (Signed)
Summary: N[place]ADAP/pt assist med arrived for Feb  Phone Note Refill Request      Prescriptions: ATRIPLA 600-200-300 MG  TABS (EFAVIRENZ-EMTRICITAB-TENOFOVIR) Take 1 tablet by mouth at bedtime  #30 x 0   Entered by:   Paulo Fruit  BS,CPht II,MPH   Authorized by:   Yisroel Ramming MD   Signed by:   Paulo Fruit  BS,CPht II,MPH on 03/07/2009   Method used:   Samples Given   RxID:   [id number]   Patient Assist Medication Verification: Medication: Atripla Lot# [id number] Exp Date:07 2013 Tech approval:MLD tried to contact patient. Unable to reach her. she still has medication here from January. Have also talked with her CM, Carlene Coria, [facility]. Paulo Fruit  BS,CPht II,MPH  March 07, 2009 2:33 PM

## 2010-03-05 NOTE — Letter (Signed)
Summary: Julia Molina: Income Verification  Julia Molina: Income Verification   Imported By: Florinda Marker 05/10/2009 14:43:08  _____________________________________________________________________  External Attachment:    Type:   Image     Comment:   External Document

## 2010-03-05 NOTE — Letter (Signed)
Summary: Generic Letter  [facility]  [address]   [place], [place] [address]   Phone: [phone]  Fax: [phone]          March 02, 2009  [facility] [address] [place], [place]  [address]  Dear Ms. YERIAN,  It is time to schedule your annual PAP Smear.  Please call the above telephone number to make this important appointment.  I look forward to seeing you soon.  Thank you.  Sincerely,    Jennet Maduro RN [facility]

## 2010-03-05 NOTE — Progress Notes (Signed)
Summary: NCADAP/pt assist meds arrived for Jul  Phone Note Refill Request      Prescriptions: BACTRIM DS 800-160 MG TABS (SULFAMETHOXAZOLE-TRIMETHOPRIM) Take 1 tablet by mouth once a day  #30 x 0   Entered by:   Paulo Fruit  BS,CPht II,MPH   Authorized by:   Yisroel Ramming MD   Signed by:   Paulo Fruit  BS,CPht II,MPH on 08/07/2009   Method used:   Samples Given   RxID:   [id number] ATRIPLA 600-200-300 MG  TABS (EFAVIRENZ-EMTRICITAB-TENOFOVIR) Take 1 tablet by mouth at bedtime  #30 x 0   Entered by:   Paulo Fruit  BS,CPht II,MPH   Authorized by:   Yisroel Ramming MD   Signed by:   Paulo Fruit  BS,CPht II,MPH on 08/07/2009   Method used:   Samples Given   RxID:   [id number]  Patient Assist Medication Verification: Medication name: Atripla RX # [id number] Tech approval:MLD  Patient Assist Medication Verification: Medication name:Sulfameth/Trimethoprim 800/160mg  RX # [id number] Tech approval:MLD Left message on the After Care Coordinator's Marchelle Folks [employer] voicemail per patient's request. Paulo Fruit  BS,CPht II,MPH  August 07, 2009 1:57 PM   Appended Document: NCADAP/pt assist meds arrived for Jul Prescription/Samples picked up by: patient

## 2010-03-05 NOTE — Assessment & Plan Note (Signed)
Summary: pap smear[mkj]   Vital Signs:  Patient profile:   45 year old female Menstrual status:  regular LMP:     09/17/2009  Vitals Entered By: Jennet Maduro RN (October 26, 2009 10:14 AM) CC: PAP smear visit.  Pt. given condoms.   Pt. given educational materials re:  HIV and women, diet, exercise, BSE, self-esteem and nutrition.  Pt. given mammogram scholarship application to complete and return. LMP (date): 09/17/2009 LMP - Character: normal LMP - Reliable? Yes     Menstrual Status regular Enter LMP: 09/17/2009 Last PAP Result NEGATIVE FOR INTRAEPITHELIAL LESIONS OR MALIGNANCY.   Patient Instructions: 1)  It will take about a week for the results.  I will contact you by mail with them. 2)  Please schedule a follow-up appointment in 1 year. 3)  Thank your for coming to the Center for your care. 4)  Denise   CC:  PAP smear visit.  Pt. given condoms.   Pt. given educational materials re:  HIV and women, diet, exercise, BSE, and self-esteem and nutrition.  Pt. given mammogram scholarship application to complete and return..   Evaluation and Follow-Up  Prevention For Positives: 10/26/2009   Safe sex practices discussed with patient. Condoms offered. Prior Medications: ATRIPLA 600-200-300 MG  TABS (EFAVIRENZ-EMTRICITAB-TENOFOVIR) Take 1 tablet by mouth at bedtime NU-IRON 150 MG  CAPS (POLYSACCHARIDE IRON COMPLEX) Take 1 tablet by mouth two times a day ENSURE  LIQD (NUTRITIONAL SUPPLEMENTS) one can two times a day BACTRIM DS 800-160 MG TABS (SULFAMETHOXAZOLE-TRIMETHOPRIM) Take 1 tablet by mouth once a day Current Allergies: No known allergies  Orders Added: 1)  T-PAP [facility]) [[address]] 2)  Est. Patient Level I [[address]]             Prevention For Positives: 10/26/2009   Safe sex practices discussed with patient. Condoms offered.

## 2010-03-05 NOTE — Assessment & Plan Note (Signed)
Summary: F/U OV/MED REFILLS/VS   CC:  follow-up visit, lab results, to restart meds., and B/P elevated.  History of Present Illness: Pt states that she went back to drinking and it got out of control so is now in a 30 day treatment program. She stopped taking her meds the end of last year but would like to get back on them. She denies any new problems.  Preventive Screening-Counseling & Management  Alcohol-Tobacco     Alcohol drinks/day: 0     Alcohol type: beer     Smoking Status: current     Packs/Day: <0.25     Passive Smoke Exposure: yes  Caffeine-Diet-Exercise     Caffeine use/day: occasional     Does Patient Exercise: yes     Type of exercise: walking     Exercise (avg: min/session): >60     Times/week: 6  Safety-Violence-Falls     Seat Belt Use: yes      Sexual History:  n/a.        Drug Use:  former and yes.    Comments: pt. declined condoms   Updated Prior Medication List: ATRIPLA 600-200-300 MG  TABS (EFAVIRENZ-EMTRICITAB-TENOFOVIR) Take 1 tablet by mouth at bedtime NU-IRON 150 MG  CAPS (POLYSACCHARIDE IRON COMPLEX) Take 1 tablet by mouth two times a day ENSURE  LIQD (NUTRITIONAL SUPPLEMENTS) one can two times a day BACTRIM DS 800-160 MG TABS (SULFAMETHOXAZOLE-TRIMETHOPRIM) Take 1 tablet by mouth once a day  Current Allergies (reviewed today): No known allergies  Past History:  Past Medical History: Last updated: 11/27/2006 Anemia-iron deficiency HIV disease  Social History: Sexual History:  n/a Drug Use:  former, yes  Review of Systems  The patient denies anorexia, fever, and weight loss.    Vital Signs:  Patient profile:   45 year old female Height:      67 inches (170.18 cm) Weight:      140.8 pounds (64 kg) BMI:     22.13 O2 Sat:      100 % on Room air Temp:     97.9 degrees F (36.61 degrees C) oral Pulse rate:   82 / minute BP sitting:   169 / 101  (right arm)  Vitals Entered By: Wendall Mola CMA Duncan Dull) (August 01, 2009 10:33  AM)  O2 Flow:  Room air CC: follow-up visit, lab results, to restart meds., B/P elevated Is Patient Diabetic? No Pain Assessment Patient in pain? no      Nutritional Status BMI of 25 - 29 = overweight Nutritional Status Detail appetite "good"  Have you ever been in a relationship where you felt threatened, hurt or afraid?No   Does patient need assistance? Functional Status Self care Ambulation Normal Comments pt. has been off meds since February   Physical Exam  General:  alert, well-developed, well-nourished, and well-hydrated.   Head:  normocephalic and atraumatic.   Mouth:  pharynx pink and moist.  no thrush  Lungs:  normal breath sounds.      Impression & Recommendations:  Problem # 1:  HIV DISEASE (ICD-042) Pt.s most recent CD4ct was 80 and VL 90,300 .  Pt instructed to continue the current antiretroviral regimen.  Pt encouraged to take medication regularly and not miss doses.  Pt will f/u in 6 weeks for repeat blood work and will see me 2 weeks later.  I will start her back on PCP prophylaxis.  Diagnostics Reviewed:  HIV: CDC-defined AIDS (03/29/2008)   HIV-Western blot: Positive (11/05/2006)   CD4: 80 (  04/27/2009)   WBC: 2.7 (04/27/2009)   Hgb: 10.7 (04/27/2009)   HCT: 31.8 (04/27/2009)   Platelets: 284 (04/27/2009) HIV-1 RNA: 90300 (04/27/2009)   HBSAg: NEG (11/05/2006)  Problem # 2:  ELEVATED BLOOD PRESSURE (ICD-796.2) Pt will have BP monitored at the treatment center and if it continues to be high we will need to start therapy.  Problem # 3:  SCREENING FOR MALIGNANT NEOPLASM OF THE CERVIX (ICD-V76.2) refer to PAP clinic  Medications Added to Medication List This Visit: 1)  Bactrim Ds 800-160 Mg Tabs (Sulfamethoxazole-trimethoprim) .... Take 1 tablet by mouth once a day  Other Orders: Est. Patient Level III ([address]) Future Orders: T-CD4SP ([facility]) (CD4SP) ... 09/12/2009 T-HIV Viral Load [phone]) ... 09/12/2009 T-Comprehensive Metabolic Panel  ([id number]) ... 09/12/2009 T-CBC w/Diff ([id number]) ... 09/12/2009 T-RPR (Syphilis) [phone]) ... 09/12/2009  Patient Instructions: 1)  Please schedule a follow-up appointment in 8 weeks, 2 weeks after labs. 2)  Please schedule in PAP clinic  Prescriptions: BACTRIM DS 800-160 MG TABS (SULFAMETHOXAZOLE-TRIMETHOPRIM) Take 1 tablet by mouth once a day  #30 x 5   Entered and Authorized by:   Yisroel Ramming MD   Signed by:   Yisroel Ramming MD on 08/01/2009   Method used:   Print then Give to Patient   RxID:   [id number] ATRIPLA 600-200-300 MG  TABS (EFAVIRENZ-EMTRICITAB-TENOFOVIR) Take 1 tablet by mouth at bedtime  #30 x 5   Entered and Authorized by:   Yisroel Ramming MD   Signed by:   Yisroel Ramming MD on 08/01/2009   Method used:   Print then Give to Patient   RxID:   [id number]

## 2010-03-05 NOTE — Progress Notes (Signed)
Summary: ncadap meds arrived for Aug  Phone Note Refill Request      Prescriptions: BACTRIM DS 800-160 MG TABS (SULFAMETHOXAZOLE-TRIMETHOPRIM) Take 1 tablet by mouth once a day  #30 x 0   Entered by:   Paulo Fruit  BS,CPht II,MPH   Authorized by:   Yisroel Ramming MD   Signed by:   Paulo Fruit  BS,CPht II,MPH on 10/02/2009   Method used:   Samples Given   RxID:   [id number] ATRIPLA 600-200-300 MG  TABS (EFAVIRENZ-EMTRICITAB-TENOFOVIR) Take 1 tablet by mouth at bedtime  #30 x 0   Entered by:   Paulo Fruit  BS,CPht II,MPH   Authorized by:   Yisroel Ramming MD   Signed by:   Paulo Fruit  BS,CPht II,MPH on 10/02/2009   Method used:   Samples Given   RxID:   [id number]  Patient Assist Medication Verification: Medication name: Atripla RX # [id number] Tech approval:MLD  Patient Assist Medication Verification: Medication name:Sulfameth/trimethroprim 800/160mg  RX # [id number] Tech approval:MLD spoke to patient's CM Jasmine December at [facility] who is going to let patient know its ready to be picked up. Paulo Fruit  BS,CPht II,MPH  October 02, 2009 9:19 AM

## 2010-03-05 NOTE — Progress Notes (Signed)
Summary: ncadap meds arriv. for sept-pt picked up Augs' 10/26/09  Phone Note Refill Request      Prescriptions: BACTRIM DS 800-160 MG TABS (SULFAMETHOXAZOLE-TRIMETHOPRIM) Take 1 tablet by mouth once a day  #30 x 0   Entered by:   Paulo Fruit  BS,CPht II,MPH   Authorized by:   Yisroel Ramming MD   Signed by:   Paulo Fruit  BS,CPht II,MPH on 10/26/2009   Method used:   Samples Given   RxID:   [id number] ATRIPLA 600-200-300 MG  TABS (EFAVIRENZ-EMTRICITAB-TENOFOVIR) Take 1 tablet by mouth at bedtime  #30 x 0   Entered by:   Paulo Fruit  BS,CPht II,MPH   Authorized by:   Yisroel Ramming MD   Signed by:   Paulo Fruit  BS,CPht II,MPH on 10/26/2009   Method used:   Samples Given   RxID:   [id number]  **Patient just picked up medication from 10/02/09**  Patient Assist Medication Verification: Medication name: Neva Seat # [id number] Tech approval:mld  Patient Assist Medication Verification: Medication name:sulfameth/trimethroprim 800/160mg  RX # [id number] Tech approval:mld Paulo Fruit  BS,CPht II,MPH  October 26, 2009 12:11 PM

## 2010-03-05 NOTE — Progress Notes (Signed)
Summary: NCADAP/pt assist meds arrived for Aug  Phone Note Refill Request      Prescriptions: BACTRIM DS 800-160 MG TABS (SULFAMETHOXAZOLE-TRIMETHOPRIM) Take 1 tablet by mouth once a day  #30 x 0   Entered by:   Paulo Fruit  BS,CPht II,MPH   Authorized by:   Yisroel Ramming MD   Signed by:   Paulo Fruit  BS,CPht II,MPH on 09/03/2009   Method used:   Samples Given   RxID:   [id number] ATRIPLA 600-200-300 MG  TABS (EFAVIRENZ-EMTRICITAB-TENOFOVIR) Take 1 tablet by mouth at bedtime  #30 x 0   Entered by:   Paulo Fruit  BS,CPht II,MPH   Authorized by:   Yisroel Ramming MD   Signed by:   Paulo Fruit  BS,CPht II,MPH on 09/03/2009   Method used:   Samples Given   RxID:   [id number]  Patient Assist Medication Verification: Medication name:Sulfameth/trimethoprim 800/160mg  RX # [id number] Tech approval:MLD  Patient Assist Medication Verification: Medication name:Atripla RX # [id number] Tech approval:MLD  spoke to patient's Case manager at [facility] who will let patient know when she comes back to their office at 1pm.  Patient's phone is temporarily off at the moment. Paulo Fruit  BS,CPht II,MPH  September 03, 2009 10:59 AM  Appended Document: NCADAP/pt assist meds arrived for Aug Prescription/Samples picked up by: patient

## 2010-03-07 NOTE — Assessment & Plan Note (Signed)
Summary: WANT RESULT FROM LAB [[facility]]   CC:  follow-up visit and pt. requesting labs.  History of Present Illness: Pt is here for lab results.  No new problems. No missed doses of her meds.  Preventive Screening-Counseling & Management  Alcohol-Tobacco     Alcohol drinks/day: 0     Alcohol type: beer     Smoking Status: current     Packs/Day: <0.25     Passive Smoke Exposure: yes  Caffeine-Diet-Exercise     Caffeine use/day: occasional     Does Patient Exercise: yes     Type of exercise: walking     Exercise (avg: min/session): >60     Times/week: 6  Safety-Violence-Falls     Seat Belt Use: yes      Sexual History:  n/a.        Drug Use:  former and yes.     Updated Prior Medication List: ATRIPLA 600-200-300 MG  TABS (EFAVIRENZ-EMTRICITAB-TENOFOVIR) Take 1 tablet by mouth at bedtime NU-IRON 150 MG  CAPS (POLYSACCHARIDE IRON COMPLEX) Take 1 tablet by mouth two times a day ENSURE  LIQD (NUTRITIONAL SUPPLEMENTS) one can two times a day BACTRIM DS 800-160 MG TABS (SULFAMETHOXAZOLE-TRIMETHOPRIM) Take 1 tablet by mouth once a day  Current Allergies (reviewed today): No known allergies  Past History:  Past Medical History: Last updated: 11/27/2006 Anemia-iron deficiency HIV disease  Review of Systems  The patient denies anorexia, fever, and weight loss.    Vital Signs:  Patient profile:   45 year old female Menstrual status:  regular Height:      67 inches (170.18 cm) Weight:      162.4 pounds (73.82 kg) BMI:     25.53 Temp:     98.2 degrees F (36.78 degrees C) oral Pulse rate:   98 / minute BP sitting:   136 / 81  (left arm)  Vitals Entered By: Wendall Mola CMA Duncan Dull) (February 18, 2010 2:42 PM) CC: follow-up visit, pt. requesting labs Is Patient Diabetic? No Pain Assessment Patient in pain? no      Nutritional Status BMI of 25 - 29 = overweight Nutritional Status Detail appetite "normal"  Does patient need assistance? Functional Status Self  care Ambulation Normal Comments pt. has missed one week of meds since last visit   Physical Exam  General:  alert, well-developed, well-nourished, and well-hydrated.   Head:  normocephalic and atraumatic.   Mouth:  pharynx pink and moist.   Lungs:  normal breath sounds.      Impression & Recommendations:  Problem # 1:  HIV INFECTION (ICD-042) Pt.s most recent CD4ct was 310and VvL30  Pt instructed to continue the current antiretroviral regimen.  Pt encouraged to take medication regularly and not miss doses.  Pt will f/u in 3 months for repeat blood work and will see me 2 weeks later.  Her updated medication list for this problem includes:    Bactrim Ds 800-160 Mg Tabs (Sulfamethoxazole-trimethoprim) .Marland Kitchen... 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 Patient Level III (99213)Future Orders: T-CD4SP ([facility]) (CD4SP) ... 05/19/2010 T-HIV Viral Load [id number]) ... 05/19/2010 T-Comprehensive Metabolic Panel [id number]) ... 05/19/2010 T-CBC w/Diff ([id number]) ... 05/19/2010 T-Lipid Profile [id number]) ... 05/19/2010  Patient Instructions: 1)  Please schedule a follow-up appointment in 3 months, 2 weeks after labs

## 2010-04-16 ENCOUNTER — Encounter (INDEPENDENT_AMBULATORY_CARE_PROVIDER_SITE_OTHER): Payer: Self-pay | Admitting: *Deleted

## 2010-04-17 LAB — T-HELPER CELL (CD4) - (RCID CLINIC ONLY): CD4 T Cell Abs: 310 uL — ABNORMAL LOW (ref 400–2700)

## 2010-04-23 NOTE — Miscellaneous (Signed)
Summary: ADAP approved til 11/03/10  Clinical Lists Changes  Observations: Added new observation of AIDSDAP: Yes 2012 (04/16/2010 9:23) Added new observation of PCTFPL: 4.60  (04/16/2010 9:23) Added new observation of INCOMESOURCE: wages  (04/16/2010 9:23) Added new observation of HOUSEINCOME: 498  (04/16/2010 9:23) Added new observation of FINASSESSDT: 02/28/2010  (04/16/2010 9:23)

## 2010-04-28 LAB — T-HELPER CELL (CD4) - (RCID CLINIC ONLY): CD4 T Cell Abs: 80 uL — ABNORMAL LOW (ref 400–2700)

## 2010-05-11 LAB — T-HELPER CELL (CD4) - (RCID CLINIC ONLY): CD4 % Helper T Cell: 29 % — ABNORMAL LOW (ref 33–55)

## 2010-05-14 LAB — T-HELPER CELL (CD4) - (RCID CLINIC ONLY): CD4 T Cell Abs: 260 uL — ABNORMAL LOW (ref 400–2700)

## 2010-05-21 LAB — T-HELPER CELL (CD4) - (RCID CLINIC ONLY): CD4 % Helper T Cell: 28 % — ABNORMAL LOW (ref 33–55)

## 2010-06-06 ENCOUNTER — Other Ambulatory Visit: Payer: Self-pay

## 2010-06-06 ENCOUNTER — Other Ambulatory Visit: Payer: Self-pay | Admitting: Adult Health

## 2010-06-06 DIAGNOSIS — B20 Human immunodeficiency virus [HIV] disease: Secondary | ICD-10-CM

## 2010-06-20 ENCOUNTER — Ambulatory Visit: Payer: Self-pay | Admitting: Adult Health

## 2010-07-24 ENCOUNTER — Other Ambulatory Visit: Payer: Self-pay | Admitting: *Deleted

## 2010-07-24 DIAGNOSIS — D509 Iron deficiency anemia, unspecified: Secondary | ICD-10-CM

## 2010-07-24 DIAGNOSIS — B20 Human immunodeficiency virus [HIV] disease: Secondary | ICD-10-CM

## 2010-07-24 MED ORDER — ENSURE PO LIQD: 237.0000 mL | Freq: Two times a day (BID) | ORAL | Status: DC

## 2010-07-24 MED ORDER — EFAVIRENZ-EMTRICITAB-TENOFOVIR 600-200-300 MG PO TABS: 1.0000 | ORAL_TABLET | Freq: Every day | ORAL | Status: DC

## 2010-07-24 MED ORDER — POLYSACCHARIDE IRON COMPLEX 150 MG PO CAPS: 150.0000 mg | ORAL_CAPSULE | Freq: Two times a day (BID) | ORAL | Status: DC

## 2010-07-24 MED ORDER — SULFAMETHOXAZOLE-TRIMETHOPRIM 800-160 MG PO TABS: 1.0000 | ORAL_TABLET | Freq: Every day | ORAL | Status: AC

## 2010-08-15 ENCOUNTER — Other Ambulatory Visit (INDEPENDENT_AMBULATORY_CARE_PROVIDER_SITE_OTHER): Payer: Self-pay

## 2010-08-15 ENCOUNTER — Ambulatory Visit (INDEPENDENT_AMBULATORY_CARE_PROVIDER_SITE_OTHER): Payer: Self-pay | Admitting: Adult Health

## 2010-08-15 ENCOUNTER — Other Ambulatory Visit: Payer: Self-pay

## 2010-08-15 DIAGNOSIS — Z124 Encounter for screening for malignant neoplasm of cervix: Secondary | ICD-10-CM

## 2010-08-15 DIAGNOSIS — B20 Human immunodeficiency virus [HIV] disease: Secondary | ICD-10-CM

## 2010-08-15 DIAGNOSIS — Z21 Asymptomatic human immunodeficiency virus [HIV] infection status: Secondary | ICD-10-CM

## 2010-08-15 LAB — CBC WITH DIFFERENTIAL/PLATELET
Eosinophils Relative: 3 % (ref 0–5)
HCT: 36.2 % (ref 36.0–46.0)
Hemoglobin: 12.1 g/dL (ref 12.0–15.0)
Lymphocytes Relative: 35 % (ref 12–46)
Lymphs Abs: 1.4 10*3/uL (ref 0.7–4.0)
MCV: 86.8 fL (ref 78.0–100.0)
Monocytes Absolute: 0.4 10*3/uL (ref 0.1–1.0)
Neutro Abs: 2.1 10*3/uL (ref 1.7–7.7)
RBC: 4.17 MIL/uL (ref 3.87–5.11)
WBC: 4 10*3/uL (ref 4.0–10.5)

## 2010-08-15 LAB — COMPREHENSIVE METABOLIC PANEL
AST: 17 U/L (ref 0–37)
Alkaline Phosphatase: 79 U/L (ref 39–117)
BUN: 17 mg/dL (ref 6–23)
Creat: 0.72 mg/dL (ref 0.50–1.10)
Potassium: 4.2 mEq/L (ref 3.5–5.3)
Total Bilirubin: 0.2 mg/dL — ABNORMAL LOW (ref 0.3–1.2)

## 2010-08-15 NOTE — Progress Notes (Signed)
  Subjective:     Julia Molina is a 45 y.o. woman who comes in today for a  pap smear only. Pap smear obtained.   Assessment:    Screening pap smear.   Plan:    Follow up in one year, or as indicated by Pap results.

## 2010-08-15 NOTE — Patient Instructions (Signed)
  Your results will be ready in about a week.  I will mail them to you.  Thank you for coming to the Center for your care.

## 2010-08-16 LAB — HIV-1 RNA QUANT-NO REFLEX-BLD: HIV-1 RNA Quant, Log: <1.3 {Log} (ref <1.30)

## 2010-08-27 ENCOUNTER — Encounter: Payer: Self-pay | Admitting: *Deleted

## 2010-08-29 ENCOUNTER — Ambulatory Visit (INDEPENDENT_AMBULATORY_CARE_PROVIDER_SITE_OTHER): Payer: Self-pay | Admitting: Adult Health

## 2010-08-29 ENCOUNTER — Ambulatory Visit: Payer: Self-pay | Admitting: Adult Health

## 2010-08-29 ENCOUNTER — Encounter: Payer: Self-pay | Admitting: Adult Health

## 2010-08-29 DIAGNOSIS — Z113 Encounter for screening for infections with a predominantly sexual mode of transmission: Secondary | ICD-10-CM

## 2010-08-29 DIAGNOSIS — B20 Human immunodeficiency virus [HIV] disease: Secondary | ICD-10-CM

## 2010-08-29 DIAGNOSIS — Z79899 Other long term (current) drug therapy: Secondary | ICD-10-CM

## 2010-08-29 NOTE — Progress Notes (Signed)
  Subjective:    Patient ID: Julia Molina, female    DOB: 08/20/1965, 45 y.o.   MRN: [id number]  HPI Presents to clinic for routine scheduled followup. Voices no complaints. Endorses adherence for medications with good tolerance and no complications. Relates that she had a Pap smear performed 2 weeks ago, but has not had a mammogram ever.   Review of Systems  Constitutional: Negative.   HENT: Negative.   Eyes: Negative.   Respiratory: Negative.   Cardiovascular: Negative.   Gastrointestinal: Negative.   Genitourinary: Negative.   Musculoskeletal: Negative.   Skin: Negative.   Neurological: Negative.   Hematological: Negative.   Psychiatric/Behavioral: Negative.        Objective:   Physical Exam  Constitutional: She is oriented to person, place, and time. She appears well-developed and well-nourished. No distress.  HENT:  Head: Normocephalic and atraumatic.  Right Ear: External ear normal.  Left Ear: External ear normal.  Mouth/Throat: Oropharynx is clear and moist.  Eyes: Conjunctivae and EOM are normal. Pupils are equal, round, and reactive to light.  Neck: Normal range of motion. Neck supple.  Cardiovascular: Normal rate, regular rhythm, normal heart sounds and intact distal pulses.   Pulmonary/Chest: Effort normal and breath sounds normal.  Abdominal: Soft. Bowel sounds are normal.  Musculoskeletal: Normal range of motion.  Neurological: She is alert and oriented to person, place, and time. No cranial nerve deficit. She exhibits normal muscle tone. Coordination normal.  Skin: Skin is warm and dry.  Psychiatric: She has a normal mood and affect. Her behavior is normal. Judgment and thought content normal.          Assessment & Plan:  1. HIV. From labs obtained 08/15/2010. Her CD4 count was 360 at 24% with a viral load less than 20 copies per mL. Clinically stable on current regimen. Recommend continuing present management, and following up in 6 months with repeat  labs 2 weeks before next appointment (fasting.). It was also noted that she continues to take trimethoprim-sulfa, for PCP prophylaxis, which is no longer warranted. She was instructed to discontinue that therapy.  2. Routine Health Maintenance. Was given an application for a grant to obtain a mammogram. She is instructed to followup with this process before her next visit.  She verbally acknowledged all information that was provided to her and agreed with plan of care.

## 2010-08-29 NOTE — Patient Instructions (Addendum)
1.  Please schedule mammogram 2. Fast 8-10 hours before next blood draw 3.  Stop Bactrim

## 2010-10-22 ENCOUNTER — Other Ambulatory Visit: Payer: Self-pay | Admitting: *Deleted

## 2010-10-22 DIAGNOSIS — B20 Human immunodeficiency virus [HIV] disease: Secondary | ICD-10-CM

## 2010-10-22 MED ORDER — EFAVIRENZ-EMTRICITAB-TENOFOVIR 600-200-300 MG PO TABS: 1.0000 | ORAL_TABLET | Freq: Every day | ORAL | Status: DC

## 2010-11-04 LAB — T-HELPER CELL (CD4) - (RCID CLINIC ONLY): CD4 T Cell Abs: 340 — ABNORMAL LOW

## 2010-11-14 LAB — T-HELPER CELL (CD4) - (RCID CLINIC ONLY): CD4 T Cell Abs: 120 — ABNORMAL LOW

## 2011-02-17 ENCOUNTER — Telehealth: Payer: Self-pay | Admitting: Infectious Disease

## 2011-02-17 NOTE — Telephone Encounter (Signed)
Received call from [facility] concerning Embree Brawley coverage.  Ms Conover has ADAP - gave to Kandice Robinsons

## 2011-02-19 ENCOUNTER — Other Ambulatory Visit (INDEPENDENT_AMBULATORY_CARE_PROVIDER_SITE_OTHER): Payer: Self-pay

## 2011-02-19 ENCOUNTER — Other Ambulatory Visit: Payer: Self-pay | Admitting: Infectious Disease

## 2011-02-19 DIAGNOSIS — B20 Human immunodeficiency virus [HIV] disease: Secondary | ICD-10-CM

## 2011-02-19 DIAGNOSIS — Z79899 Other long term (current) drug therapy: Secondary | ICD-10-CM

## 2011-02-19 DIAGNOSIS — Z113 Encounter for screening for infections with a predominantly sexual mode of transmission: Secondary | ICD-10-CM

## 2011-02-19 LAB — CBC WITH DIFFERENTIAL/PLATELET
Eosinophils Absolute: 0.1 10*3/uL (ref 0.0–0.7)
Hemoglobin: 13.6 g/dL (ref 12.0–15.0)
Lymphocytes Relative: 46 % (ref 12–46)
Lymphs Abs: 1.6 10*3/uL (ref 0.7–4.0)
MCH: 28.4 pg (ref 26.0–34.0)
Monocytes Relative: 6 % (ref 3–12)
Neutrophils Relative %: 45 % (ref 43–77)
Platelets: 325 10*3/uL (ref 150–400)
RBC: 4.79 MIL/uL (ref 3.87–5.11)
WBC: 3.5 10*3/uL — ABNORMAL LOW (ref 4.0–10.5)

## 2011-02-19 LAB — COMPLETE METABOLIC PANEL WITH GFR
ALT: 12 U/L (ref 0–35)
Albumin: 4.4 g/dL (ref 3.5–5.2)
CO2: 18 mEq/L — ABNORMAL LOW (ref 19–32)
Calcium: 8.7 mg/dL (ref 8.4–10.5)
Chloride: 106 mEq/L (ref 96–112)
GFR, Est African American: >89 mL/min
GFR, Est Non African American: >89 mL/min
Glucose, Bld: 85 mg/dL (ref 70–99)
Potassium: 4.2 mEq/L (ref 3.5–5.3)
Sodium: 135 mEq/L (ref 135–145)
Total Bilirubin: 0.2 mg/dL — ABNORMAL LOW (ref 0.3–1.2)
Total Protein: 7.8 g/dL (ref 6.0–8.3)

## 2011-02-19 LAB — URINALYSIS, ROUTINE W REFLEX MICROSCOPIC
Bilirubin Urine: NEGATIVE
Nitrite: NEGATIVE
Protein, ur: NEGATIVE mg/dL
Specific Gravity, Urine: 1.021 (ref 1.005–1.030)
Urobilinogen, UA: 0.2 mg/dL (ref 0.0–1.0)

## 2011-02-19 LAB — RPR

## 2011-02-19 LAB — LIPID PANEL
LDL Cholesterol: 158 mg/dL — ABNORMAL HIGH (ref 0–99)
Total CHOL/HDL Ratio: 5.4 Ratio
VLDL: 30 mg/dL (ref 0–40)

## 2011-02-20 LAB — T-HELPER CELL (CD4) - (RCID CLINIC ONLY): CD4 % Helper T Cell: 27 % — ABNORMAL LOW (ref 33–55)

## 2011-02-20 LAB — GC/CHLAMYDIA PROBE AMP, URINE: Chlamydia, Swab/Urine, PCR: NEGATIVE

## 2011-02-27 ENCOUNTER — Telehealth: Payer: Self-pay | Admitting: *Deleted

## 2011-02-27 NOTE — Telephone Encounter (Signed)
Pt c/o intermittant pain in left upper quadrant. Started about a wk ago. It comes & goes. Happens mostly "when I am sitting around worrying" better with getting up & moving. Smokes 3 or 4 cigarettes a day. Denies nausea, pain in neck,shoulders, jaw. Denies any problems breathing. Has an appt here 6 days from now. Not able to get her in sooner. Has no insurance.. Asked her to check with Britta Mccreedy for the orange card when she comes in. Told her we can then get her into IM for a pcp.  Told her to call us if this issue changes. If we are closed & it becomes worse or if any of the symptoms I reviewed with her happen, call 911 & go to ED. She agreed with this plan

## 2011-03-05 ENCOUNTER — Other Ambulatory Visit: Payer: Self-pay | Admitting: *Deleted

## 2011-03-05 ENCOUNTER — Encounter: Payer: Self-pay | Admitting: Infectious Disease

## 2011-03-05 ENCOUNTER — Ambulatory Visit (INDEPENDENT_AMBULATORY_CARE_PROVIDER_SITE_OTHER): Payer: Self-pay | Admitting: Infectious Disease

## 2011-03-05 VITALS — BP 139/82 | HR 93 | Temp 97.1°F | Ht 66.0 in | Wt 188.0 lb

## 2011-03-05 DIAGNOSIS — N912 Amenorrhea, unspecified: Secondary | ICD-10-CM

## 2011-03-05 DIAGNOSIS — F1021 Alcohol dependence, in remission: Secondary | ICD-10-CM

## 2011-03-05 DIAGNOSIS — Z23 Encounter for immunization: Secondary | ICD-10-CM

## 2011-03-05 DIAGNOSIS — R030 Elevated blood-pressure reading, without diagnosis of hypertension: Secondary | ICD-10-CM

## 2011-03-05 DIAGNOSIS — B20 Human immunodeficiency virus [HIV] disease: Secondary | ICD-10-CM

## 2011-03-05 MED ORDER — EFAVIRENZ-EMTRICITAB-TENOFOVIR 600-200-300 MG PO TABS: 1.0000 | ORAL_TABLET | Freq: Every day | ORAL | Status: DC

## 2011-03-05 NOTE — Assessment & Plan Note (Signed)
Reasonable control. 

## 2011-03-05 NOTE — Assessment & Plan Note (Signed)
Check pregnancy test. More likely she is just going thru menopause

## 2011-03-05 NOTE — Assessment & Plan Note (Signed)
Bring back in 3 months and recheck labs. VL of 53 likely a blip

## 2011-03-05 NOTE — Progress Notes (Signed)
  Subjective:    Patient ID: Julia Molina, female    DOB: [birth year], 46 y.o.   MRN: [id number]  HPI  Julia Molina is a 46 y.o. female who is doing superbly well on her antiviral regimen, with nearly  undetectable viral load (53) and health cd4 count. She admitted to missing one dose of atripla two months ago. She is having some night sweats, hot flashes and no periods since October. She has not been sexually active.   Review of Systems  Constitutional: Negative for fever, chills, diaphoresis, activity change, appetite change, fatigue and unexpected weight change.  HENT: Negative for congestion, sore throat, rhinorrhea, sneezing, trouble swallowing and sinus pressure.   Eyes: Negative for photophobia and visual disturbance.  Respiratory: Negative for cough, chest tightness, shortness of breath, wheezing and stridor.   Cardiovascular: Negative for chest pain, palpitations and leg swelling.  Gastrointestinal: Negative for nausea, vomiting, abdominal pain, diarrhea, constipation, blood in stool, abdominal distention and anal bleeding.  Genitourinary: Positive for hematuria. Negative for dysuria, flank pain and difficulty urinating.  Musculoskeletal: Negative for myalgias, back pain, joint swelling, arthralgias and gait problem.  Skin: Negative for color change, pallor, rash and wound.  Neurological: Negative for dizziness, tremors, weakness and light-headedness.  Hematological: Negative for adenopathy. Does not bruise/bleed easily.  Psychiatric/Behavioral: Negative for behavioral problems, confusion, sleep disturbance, dysphoric mood, decreased concentration and agitation.       Objective:   Physical Exam  Constitutional: She is oriented to person, place, and time. She appears well-developed and well-nourished. No distress.  HENT:  Head: Normocephalic and atraumatic.  Mouth/Throat: Oropharynx is clear and moist. No oropharyngeal exudate.  Eyes: Conjunctivae and EOM are normal. Pupils  are equal, round, and reactive to light. No scleral icterus.  Neck: Normal range of motion. Neck supple. No JVD present.  Cardiovascular: Normal rate, regular rhythm and normal heart sounds.  Exam reveals no gallop and no friction rub.   No murmur heard. Pulmonary/Chest: Effort normal and breath sounds normal. No respiratory distress. She has no wheezes. She has no rales. She exhibits no tenderness.  Abdominal: She exhibits no distension and no mass. There is no tenderness. There is no rebound and no guarding.  Musculoskeletal: She exhibits no edema and no tenderness.  Lymphadenopathy:    She has no cervical adenopathy.  Neurological: She is alert and oriented to person, place, and time. She has normal reflexes. She exhibits normal muscle tone. Coordination normal.  Skin: Skin is warm and dry. She is not diaphoretic. No erythema. No pallor.  Psychiatric: She has a normal mood and affect. Her behavior is normal. Judgment and thought content normal.          Assessment & Plan:

## 2011-04-21 ENCOUNTER — Emergency Department (HOSPITAL_BASED_OUTPATIENT_CLINIC_OR_DEPARTMENT_OTHER)
Admission: EM | Admit: 2011-04-21 | Discharge: 2011-04-21 | Disposition: A | Discharge status: Home / Self Care | Payer: Self-pay | Attending: Emergency Medicine | Admitting: Emergency Medicine

## 2011-04-21 ENCOUNTER — Encounter (HOSPITAL_BASED_OUTPATIENT_CLINIC_OR_DEPARTMENT_OTHER): Payer: Self-pay

## 2011-04-21 DIAGNOSIS — R059 Cough, unspecified: Secondary | ICD-10-CM | POA: Insufficient documentation

## 2011-04-21 DIAGNOSIS — R5381 Other malaise: Secondary | ICD-10-CM | POA: Insufficient documentation

## 2011-04-21 DIAGNOSIS — IMO0001 Reserved for inherently not codable concepts without codable children: Secondary | ICD-10-CM | POA: Insufficient documentation

## 2011-04-21 DIAGNOSIS — R05 Cough: Secondary | ICD-10-CM | POA: Insufficient documentation

## 2011-04-21 DIAGNOSIS — J069 Acute upper respiratory infection, unspecified: Secondary | ICD-10-CM | POA: Insufficient documentation

## 2011-04-21 DIAGNOSIS — Z21 Asymptomatic human immunodeficiency virus [HIV] infection status: Secondary | ICD-10-CM | POA: Insufficient documentation

## 2011-04-21 DIAGNOSIS — F17200 Nicotine dependence, unspecified, uncomplicated: Secondary | ICD-10-CM | POA: Insufficient documentation

## 2011-04-21 HISTORY — DX: Human immunodeficiency virus (HIV) disease: B20

## 2011-04-21 HISTORY — DX: Asymptomatic human immunodeficiency virus (hiv) infection status: Z21

## 2011-04-21 MED ORDER — OXYMETAZOLINE HCL 0.05 % NA SOLN
1.0000 | Freq: Once | NASAL | Status: AC
  Administered 2011-04-21: 1 via NASAL
  Filled 2011-04-21: qty 15

## 2011-04-21 MED ORDER — FEXOFENADINE-PSEUDOEPHED ER 60-120 MG PO TB12: 1.0000 | ORAL_TABLET | Freq: Two times a day (BID) | ORAL | Status: DC

## 2011-04-21 NOTE — ED Notes (Signed)
MD at bedside. 

## 2011-04-21 NOTE — ED Provider Notes (Signed)
History     CSN: [id number]  Arrival date & time 04/21/11  4098   First MD Initiated Contact with Patient 04/21/11 [phone]      Chief Complaint  Patient presents with  . Generalized Body Aches  . Cough  . Chills    (Consider location/radiation/quality/duration/timing/severity/associated sxs/prior treatment) HPI Comments: This is an HIV-positive patient is currently on an antiviral regimen. Her last CD4 count was good with a nearly undetectable viral load per review of old records. She presents with a two-day history of nasal congestion and pain around her sinuses. She has a mild cough, but denies any chest congestion. Denies any fevers. She just feels achy all over. She was exposed to a young child. A few days ago, where she thought she caught this cold. Denies any nausea, vomiting. Denies rashes. As the dizziness. She just feels tired and achy and has a lot of nasal congestion and pain around her sinuses. She tried over-the-counter medicines with no relief.  Patient is a 46 y.o. female presenting with cough. The history is provided by the patient.  Cough Associated symptoms include rhinorrhea and myalgias. Pertinent negatives include no chest pain, no chills, no headaches and no shortness of breath.    Past Medical History  Diagnosis Date  . HIV (human immunodeficiency virus infection)     Past Surgical History  Procedure Date  . Cesarean section     No family history on file.  History  Substance Use Topics  . Smoking status: Current Everyday Smoker -- 0.3 packs/day    Types: Cigarettes  . Smokeless tobacco: Never Used  . Alcohol Use: No    OB History    Grav Para Term Preterm Abortions TAB SAB Ect Mult Living                  Review of Systems  Constitutional: Positive for fatigue. Negative for fever, chills and diaphoresis.  HENT: Positive for congestion, rhinorrhea, sneezing and postnasal drip. Negative for neck stiffness.   Eyes: Negative.   Respiratory:  Positive for cough. Negative for chest tightness and shortness of breath.   Cardiovascular: Negative for chest pain and leg swelling.  Gastrointestinal: Negative for nausea, vomiting, abdominal pain, diarrhea and blood in stool.  Genitourinary: Negative for frequency, hematuria, flank pain and difficulty urinating.  Musculoskeletal: Positive for myalgias. Negative for back pain and arthralgias.  Skin: Negative for rash.  Neurological: Negative for dizziness, speech difficulty, weakness, numbness and headaches.    Allergies  Review of patient's allergies indicates no known allergies.  Home Medications   Current Outpatient Rx  Name Route Sig Dispense Refill  . EFAVIRENZ-EMTRICITAB-TENOFOVIR 600-200-300 MG PO TABS Oral Take 1 tablet by mouth at bedtime. 30 tablet 6  . FEXOFENADINE-PSEUDOEPHED ER 60-120 MG PO TB12 Oral Take 1 tablet by mouth every 12 (twelve) hours. 30 tablet 0  . POLYSACCHARIDE IRON COMPLEX 150 MG PO CAPS Oral Take 1 capsule (150 mg total) by mouth 2 (two) times daily. 60 capsule 1    Needs appt    BP 145/95  Pulse 97  Temp(Src) 98.6 F (37 C) (Oral)  Resp 20  Ht 5\' 6"  (1.676 m)  Wt 188 lb (85.276 kg)  BMI 30.34 kg/m2  SpO2 100%  Physical Exam  Constitutional: She is oriented to person, place, and time. She appears well-developed and well-nourished.  HENT:  Head: Normocephalic and atraumatic.  Right Ear: External ear normal.  Left Ear: External ear normal.  Mouth/Throat: Oropharynx is clear and moist.  Positive nasal congestion, with tenderness over frontal sinuses, and maxillary sinuses  Eyes: Pupils are equal, round, and reactive to light.  Neck: Normal range of motion. Neck supple.       No meningeal signs  Cardiovascular: Normal rate, regular rhythm and normal heart sounds.   Pulmonary/Chest: Effort normal and breath sounds normal. No respiratory distress. She has no wheezes. She has no rales. She exhibits no tenderness.  Abdominal: Soft. Bowel  sounds are normal. There is no tenderness. There is no rebound and no guarding.  Musculoskeletal: Normal range of motion. She exhibits no edema.  Lymphadenopathy:    She has no cervical adenopathy.  Neurological: She is alert and oriented to person, place, and time.  Skin: Skin is warm and dry. No rash noted.  Psychiatric: She has a normal mood and affect.    ED Course  Procedures (including critical care time)  Labs Reviewed - No data to display No results found.   1. URI (upper respiratory infection)       MDM  Patient with sinus congestion, starting yesterday. Feel that this is likely viral. No chest congestion, or findings consistent with pneumonia. Will start her on Afrin spray, but identifies her only use this for a max of 3 days. We'll also give her prescription for Allegra-D. Advised her to  follow up with her infectious disease doctor, if not better within the next couple of days. Return here as needed for any worsening symptoms or increased chest congestion        Rolan Bucco, MD 04/21/11 [phone]

## 2011-04-21 NOTE — Discharge Instructions (Signed)

## 2011-04-21 NOTE — ED Notes (Signed)
Pt reports onset of generalized body aches, chills, fever and cough that started yesterday.

## 2011-04-22 ENCOUNTER — Other Ambulatory Visit: Payer: Self-pay | Admitting: Licensed Clinical Social Worker

## 2011-04-22 DIAGNOSIS — B20 Human immunodeficiency virus [HIV] disease: Secondary | ICD-10-CM

## 2011-05-21 ENCOUNTER — Other Ambulatory Visit: Payer: Self-pay

## 2011-06-04 ENCOUNTER — Ambulatory Visit: Payer: Self-pay | Admitting: Infectious Disease

## 2011-06-12 ENCOUNTER — Other Ambulatory Visit: Payer: Self-pay

## 2011-06-12 DIAGNOSIS — B20 Human immunodeficiency virus [HIV] disease: Secondary | ICD-10-CM

## 2011-06-12 LAB — CBC WITH DIFFERENTIAL/PLATELET
Basophils Absolute: 0 10*3/uL (ref 0.0–0.1)
Basophils Relative: 0 % (ref 0–1)
HCT: 37.8 % (ref 36.0–46.0)
MCHC: 33.9 g/dL (ref 30.0–36.0)
Monocytes Absolute: 0.3 10*3/uL (ref 0.1–1.0)
Neutro Abs: 3.2 10*3/uL (ref 1.7–7.7)
RDW: 15 % (ref 11.5–15.5)

## 2011-06-12 LAB — COMPLETE METABOLIC PANEL WITH GFR
Albumin: 4.2 g/dL (ref 3.5–5.2)
Alkaline Phosphatase: 98 U/L (ref 39–117)
CO2: 19 mEq/L (ref 19–32)
Chloride: 106 mEq/L (ref 96–112)
GFR, Est African American: >89 mL/min
GFR, Est Non African American: >89 mL/min
Glucose, Bld: 117 mg/dL — ABNORMAL HIGH (ref 70–99)
Potassium: 3.5 mEq/L (ref 3.5–5.3)
Sodium: 135 mEq/L (ref 135–145)
Total Protein: 7.5 g/dL (ref 6.0–8.3)

## 2011-06-13 LAB — T-HELPER CELL (CD4) - (RCID CLINIC ONLY): CD4 % Helper T Cell: 26 % — ABNORMAL LOW (ref 33–55)

## 2011-07-02 ENCOUNTER — Ambulatory Visit (INDEPENDENT_AMBULATORY_CARE_PROVIDER_SITE_OTHER): Payer: Self-pay | Admitting: Infectious Disease

## 2011-07-02 ENCOUNTER — Encounter: Payer: Self-pay | Admitting: Infectious Disease

## 2011-07-02 VITALS — BP 124/82 | HR 88 | Temp 97.8°F | Ht 66.0 in | Wt 177.8 lb

## 2011-07-02 DIAGNOSIS — F17200 Nicotine dependence, unspecified, uncomplicated: Secondary | ICD-10-CM

## 2011-07-02 DIAGNOSIS — F1021 Alcohol dependence, in remission: Secondary | ICD-10-CM

## 2011-07-02 DIAGNOSIS — D509 Iron deficiency anemia, unspecified: Secondary | ICD-10-CM

## 2011-07-02 DIAGNOSIS — B20 Human immunodeficiency virus [HIV] disease: Secondary | ICD-10-CM

## 2011-07-02 DIAGNOSIS — Z0000 Encounter for general adult medical examination without abnormal findings: Secondary | ICD-10-CM

## 2011-07-02 DIAGNOSIS — Z23 Encounter for immunization: Secondary | ICD-10-CM

## 2011-07-02 NOTE — Assessment & Plan Note (Signed)
counselled to quit 

## 2011-07-02 NOTE — Assessment & Plan Note (Signed)
Still in remission.

## 2011-07-02 NOTE — Assessment & Plan Note (Signed)
Continue atripla 

## 2011-07-02 NOTE — Progress Notes (Signed)
  Subjective:    Patient ID: Julia Molina, female    DOB: Feb 20, 1965, 46 y.o.   MRN: [id number]  HPI  Julia Molina is a 46 y.o. female who is doing superbly well on herantiviral regimen, atripla with undetectable viral load and health cd4 count. She was seen in the ED for sinus infection and rx with antibiotics. I have asked her in future to please come to our clinic for such acute problems if possible. SHe does continue to smoke but pack is lasting more than  A week.    Review of Systems  Constitutional: Negative for fever, chills, diaphoresis, activity change, appetite change, fatigue and unexpected weight change.  HENT: Negative for congestion, sore throat, rhinorrhea, sneezing, trouble swallowing and sinus pressure.   Eyes: Negative for photophobia and visual disturbance.  Respiratory: Negative for cough, chest tightness, shortness of breath, wheezing and stridor.   Cardiovascular: Negative for chest pain, palpitations and leg swelling.  Gastrointestinal: Negative for nausea, vomiting, abdominal pain, diarrhea, constipation, blood in stool, abdominal distention and anal bleeding.  Genitourinary: Negative for dysuria, hematuria, flank pain and difficulty urinating.  Musculoskeletal: Negative for myalgias, back pain, joint swelling, arthralgias and gait problem.  Skin: Negative for color change, pallor, rash and wound.  Neurological: Negative for dizziness, tremors, weakness and light-headedness.  Hematological: Negative for adenopathy. Does not bruise/bleed easily.  Psychiatric/Behavioral: Negative for behavioral problems, confusion, sleep disturbance, dysphoric mood, decreased concentration and agitation.       Objective:   Physical Exam  Constitutional: She is oriented to person, place, and time. She appears well-developed and well-nourished. No distress.  HENT:  Head: Normocephalic and atraumatic.  Mouth/Throat: Oropharynx is clear and moist. No oropharyngeal exudate.  Eyes:  Conjunctivae and EOM are normal. Pupils are equal, round, and reactive to light. No scleral icterus.  Neck: Normal range of motion. Neck supple. No JVD present.  Cardiovascular: Normal rate, regular rhythm and normal heart sounds.  Exam reveals no gallop and no friction rub.   No murmur heard. Pulmonary/Chest: Effort normal and breath sounds normal. No respiratory distress. She has no wheezes. She has no rales. She exhibits no tenderness.  Abdominal: She exhibits no distension and no mass. There is no tenderness. There is no rebound and no guarding.  Musculoskeletal: She exhibits no edema and no tenderness.  Lymphadenopathy:    She has no cervical adenopathy.  Neurological: She is alert and oriented to person, place, and time. She has normal reflexes. She exhibits normal muscle tone. Coordination normal.  Skin: Skin is warm and dry. She is not diaphoretic. No erythema. No pallor.  Psychiatric: She has a normal mood and affect. Her behavior is normal. Judgment and thought content normal.          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

## 2011-07-02 NOTE — Assessment & Plan Note (Signed)
hgb normal

## 2011-07-28 ENCOUNTER — Telehealth: Payer: Self-pay | Admitting: *Deleted

## 2011-07-28 NOTE — Telephone Encounter (Signed)
Message left to call to PAP smear appt at [facility].  Number given.

## 2011-09-19 ENCOUNTER — Other Ambulatory Visit (HOSPITAL_COMMUNITY)
Admission: RE | Admit: 2011-09-19 | Discharge: 2011-09-19 | Disposition: A | Discharge status: Home / Self Care | Payer: Medicaid Other | Source: Ambulatory Visit | Attending: Infectious Disease | Admitting: Infectious Disease

## 2011-09-19 ENCOUNTER — Ambulatory Visit (INDEPENDENT_AMBULATORY_CARE_PROVIDER_SITE_OTHER): Payer: Self-pay | Admitting: *Deleted

## 2011-09-19 DIAGNOSIS — Z0000 Encounter for general adult medical examination without abnormal findings: Secondary | ICD-10-CM

## 2011-09-19 DIAGNOSIS — Z1231 Encounter for screening mammogram for malignant neoplasm of breast: Secondary | ICD-10-CM

## 2011-09-19 DIAGNOSIS — Z124 Encounter for screening for malignant neoplasm of cervix: Secondary | ICD-10-CM

## 2011-09-19 DIAGNOSIS — Z01419 Encounter for gynecological examination (general) (routine) without abnormal findings: Secondary | ICD-10-CM | POA: Insufficient documentation

## 2011-09-19 NOTE — Patient Instructions (Signed)
Your results will be ready in about a week.  I will mail them to you.  Thank you for coming to the Center for your care.  Denise,  RN 

## 2011-09-19 NOTE — Progress Notes (Signed)
  Subjective:     Julia Molina is a 46 y.o. woman who comes in today for a  pap smear only.  Previous abnormal Pap smears: no. Contraception: abstinence,   Objective:  LMP: June 2013  Pelvic Exam:  Pap smear obtained.   Assessment:    Screening pap smear.   Plan:    Follow up in one year, or as indicated by Pap results.  Pt given educational materials re:  HIV and women, BSE, PAP smears,, heart disease and HIV, self-esteem and partner safety.  Pt given condoms.

## 2011-09-24 ENCOUNTER — Encounter: Payer: Self-pay | Admitting: *Deleted

## 2011-10-10 ENCOUNTER — Other Ambulatory Visit: Payer: Self-pay | Admitting: *Deleted

## 2011-10-10 DIAGNOSIS — B20 Human immunodeficiency virus [HIV] disease: Secondary | ICD-10-CM

## 2011-10-10 MED ORDER — EFAVIRENZ-EMTRICITAB-TENOFOVIR 600-200-300 MG PO TABS: 1.0000 | ORAL_TABLET | Freq: Every day | ORAL | Status: DC

## 2011-10-31 ENCOUNTER — Ambulatory Visit (HOSPITAL_COMMUNITY): Admission: RE | Admit: 2011-10-31 | Payer: Medicaid Other | Source: Ambulatory Visit

## 2011-12-24 ENCOUNTER — Other Ambulatory Visit: Payer: Self-pay

## 2011-12-29 ENCOUNTER — Other Ambulatory Visit (INDEPENDENT_AMBULATORY_CARE_PROVIDER_SITE_OTHER): Payer: Self-pay

## 2011-12-29 DIAGNOSIS — B20 Human immunodeficiency virus [HIV] disease: Secondary | ICD-10-CM

## 2011-12-29 DIAGNOSIS — Z79899 Other long term (current) drug therapy: Secondary | ICD-10-CM

## 2011-12-29 LAB — CBC WITH DIFFERENTIAL/PLATELET
Basophils Relative: 0 % (ref 0–1)
Eosinophils Absolute: 0.2 10*3/uL (ref 0.0–0.7)
Hemoglobin: 12.7 g/dL (ref 12.0–15.0)
MCH: 29 pg (ref 26.0–34.0)
MCHC: 34 g/dL (ref 30.0–36.0)
Monocytes Relative: 6 % (ref 3–12)
Neutrophils Relative %: 43 % (ref 43–77)

## 2011-12-30 LAB — COMPLETE METABOLIC PANEL WITH GFR
ALT: 11 U/L (ref 0–35)
Albumin: 4.3 g/dL (ref 3.5–5.2)
CO2: 25 mEq/L (ref 19–32)
Calcium: 9 mg/dL (ref 8.4–10.5)
Chloride: 107 mEq/L (ref 96–112)
GFR, Est African American: 89 mL/min
Potassium: 3.9 mEq/L (ref 3.5–5.3)
Sodium: 135 mEq/L (ref 135–145)
Total Bilirubin: 0.2 mg/dL — ABNORMAL LOW (ref 0.3–1.2)
Total Protein: 7.6 g/dL (ref 6.0–8.3)

## 2011-12-30 LAB — HIV-1 RNA QUANT-NO REFLEX-BLD: HIV-1 RNA Quant, Log: 1.36 {Log} — ABNORMAL HIGH (ref <1.30)

## 2011-12-30 LAB — LIPID PANEL
HDL: 35 mg/dL — ABNORMAL LOW (ref >39)
Total CHOL/HDL Ratio: 7.1 Ratio
VLDL: 48 mg/dL — ABNORMAL HIGH (ref 0–40)

## 2011-12-30 LAB — RPR

## 2011-12-30 LAB — T-HELPER CELL (CD4) - (RCID CLINIC ONLY): CD4 T Cell Abs: 700 uL (ref 400–2700)

## 2012-01-07 ENCOUNTER — Encounter: Payer: Self-pay | Admitting: Infectious Disease

## 2012-01-07 ENCOUNTER — Ambulatory Visit (INDEPENDENT_AMBULATORY_CARE_PROVIDER_SITE_OTHER): Payer: Self-pay | Admitting: Infectious Disease

## 2012-01-07 VITALS — BP 126/78 | HR 86 | Temp 97.9°F | Ht 67.0 in | Wt 189.0 lb

## 2012-01-07 DIAGNOSIS — B20 Human immunodeficiency virus [HIV] disease: Secondary | ICD-10-CM

## 2012-01-07 DIAGNOSIS — E785 Hyperlipidemia, unspecified: Secondary | ICD-10-CM

## 2012-01-07 DIAGNOSIS — Z23 Encounter for immunization: Secondary | ICD-10-CM

## 2012-01-07 DIAGNOSIS — F17200 Nicotine dependence, unspecified, uncomplicated: Secondary | ICD-10-CM

## 2012-01-07 MED ORDER — VARENICLINE TARTRATE 0.5 MG X 11 & 1 MG X 42 PO MISC: ORAL | Status: DC

## 2012-01-07 MED ORDER — PRAVASTATIN SODIUM 20 MG PO TABS: 20.0000 mg | ORAL_TABLET | Freq: Every day | ORAL | Status: DC

## 2012-01-07 MED ORDER — VARENICLINE TARTRATE 1 MG PO TABS: 1.0000 mg | ORAL_TABLET | Freq: Two times a day (BID) | ORAL | Status: DC

## 2012-01-07 NOTE — Progress Notes (Signed)
  Subjective:    Patient ID: Julia Molina, female    DOB: 03/31/1965, 46 y.o.   MRN: [id number]  HPI  Julia Molina is a 46 y.o. female who is doing superbly well on her antiviral regimen, atripla with undetectable viral load and health cd4 count. She continues to smoke tobacco but is willing to try chantix. No other complaints today.   Review of Systems  Constitutional: Negative for fever, chills, diaphoresis, activity change, appetite change, fatigue and unexpected weight change.  HENT: Negative for congestion, sore throat, rhinorrhea, sneezing, trouble swallowing and sinus pressure.   Eyes: Negative for photophobia and visual disturbance.  Respiratory: Negative for cough, chest tightness, shortness of breath, wheezing and stridor.   Cardiovascular: Negative for chest pain, palpitations and leg swelling.  Gastrointestinal: Negative for nausea, vomiting, abdominal pain, diarrhea, constipation, blood in stool, abdominal distention and anal bleeding.  Genitourinary: Negative for dysuria, hematuria, flank pain and difficulty urinating.  Musculoskeletal: Negative for myalgias, back pain, joint swelling, arthralgias and gait problem.  Skin: Negative for color change, pallor, rash and wound.  Neurological: Negative for dizziness, tremors, weakness and light-headedness.  Hematological: Negative for adenopathy. Does not bruise/bleed easily.  Psychiatric/Behavioral: Negative for behavioral problems, confusion, sleep disturbance, dysphoric mood, decreased concentration and agitation.       Objective:   Physical Exam  Constitutional: She is oriented to person, place, and time. She appears well-developed and well-nourished. No distress.  HENT:  Head: Normocephalic and atraumatic.  Mouth/Throat: Oropharynx is clear and moist. No oropharyngeal exudate.  Eyes: Conjunctivae normal and EOM are normal. Pupils are equal, round, and reactive to light. No scleral icterus.  Neck: Normal range of motion.  Neck supple. No JVD present.  Cardiovascular: Normal rate, regular rhythm and normal heart sounds.  Exam reveals no gallop and no friction rub.   No murmur heard. Pulmonary/Chest: Effort normal and breath sounds normal. No respiratory distress. She has no wheezes. She has no rales. She exhibits no tenderness.  Abdominal: She exhibits no distension and no mass. There is no tenderness. There is no rebound and no guarding.  Musculoskeletal: She exhibits no edema and no tenderness.  Lymphadenopathy:    She has no cervical adenopathy.  Neurological: She is alert and oriented to person, place, and time. She has normal reflexes. She exhibits normal muscle tone. Coordination normal.  Skin: Skin is warm and dry. She is not diaphoretic. No erythema. No pallor.  Psychiatric: She has a normal mood and affect. Her behavior is normal. Judgment and thought content normal.          Assessment & Plan:  HIV: continue atripla  Smoking: start chantix  HCM: flu shot  Hyperlipidemia: start pravachol

## 2012-02-25 ENCOUNTER — Ambulatory Visit: Payer: Self-pay | Admitting: Infectious Disease

## 2012-03-02 ENCOUNTER — Ambulatory Visit: Payer: Self-pay | Admitting: Infectious Disease

## 2012-03-17 ENCOUNTER — Ambulatory Visit: Payer: Self-pay | Admitting: Infectious Disease

## 2012-03-24 ENCOUNTER — Ambulatory Visit (INDEPENDENT_AMBULATORY_CARE_PROVIDER_SITE_OTHER): Payer: Self-pay | Admitting: Infectious Disease

## 2012-03-24 ENCOUNTER — Encounter: Payer: Self-pay | Admitting: Infectious Disease

## 2012-03-24 VITALS — BP 154/88 | HR 85 | Temp 98.3°F | Wt 191.0 lb

## 2012-03-24 DIAGNOSIS — B20 Human immunodeficiency virus [HIV] disease: Secondary | ICD-10-CM

## 2012-03-24 DIAGNOSIS — L0292 Furuncle, unspecified: Secondary | ICD-10-CM | POA: Insufficient documentation

## 2012-03-24 DIAGNOSIS — R030 Elevated blood-pressure reading, without diagnosis of hypertension: Secondary | ICD-10-CM

## 2012-03-24 LAB — BASIC METABOLIC PANEL WITH GFR
BUN: 9 mg/dL (ref 6–23)
Chloride: 108 mEq/L (ref 96–112)
Creat: 0.77 mg/dL (ref 0.50–1.10)
GFR, Est African American: >89 mL/min
GFR, Est Non African American: >89 mL/min
Potassium: 3.9 mEq/L (ref 3.5–5.3)

## 2012-03-24 MED ORDER — DOXYCYCLINE HYCLATE 100 MG PO TABS: 100.0000 mg | ORAL_TABLET | Freq: Two times a day (BID) | ORAL | Status: DC

## 2012-03-24 MED ORDER — TRIAMCINOLONE ACETONIDE 0.5 % EX OINT: TOPICAL_OINTMENT | Freq: Two times a day (BID) | CUTANEOUS | Status: DC

## 2012-03-24 NOTE — Progress Notes (Signed)
  Subjective:    Patient ID: Julia Molina, female    DOB: [birth year], 47 y.o.   MRN: [id number]  HPI  47 y.o. female who is doing superbly well on her antiviral regimen, atripla with undetectable viral load and health cd4 count comes in with several complaints today she has had a pruritic rash on her left upper chin as well as marked right arm.  She also complains of a lesion that sounds like a boil that erupt on her right breast the draining purulent material she also is a similar lesion that is under her left breast also drained some purulent material.  Otherwise she has no specific complaints today.   Review of Systems  Constitutional: Negative for fever, chills, diaphoresis, activity change, appetite change, fatigue and unexpected weight change.  HENT: Negative for congestion, sore throat, rhinorrhea, sneezing, trouble swallowing and sinus pressure.   Eyes: Negative for photophobia and visual disturbance.  Respiratory: Negative for cough, chest tightness, shortness of breath, wheezing and stridor.   Cardiovascular: Negative for chest pain, palpitations and leg swelling.  Gastrointestinal: Negative for nausea, vomiting, abdominal pain, diarrhea, constipation, blood in stool, abdominal distention and anal bleeding.  Genitourinary: Negative for dysuria, hematuria, flank pain and difficulty urinating.  Musculoskeletal: Negative for myalgias, back pain, joint swelling, arthralgias and gait problem.  Skin: Positive for rash. Negative for color change, pallor and wound.  Neurological: Negative for dizziness, tremors, weakness and light-headedness.  Hematological: Negative for adenopathy. Does not bruise/bleed easily.  Psychiatric/Behavioral: Negative for behavioral problems, confusion, sleep disturbance, dysphoric mood, decreased concentration and agitation.       Objective:   Physical Exam  Constitutional: She is oriented to person, place, and time. She appears well-developed and  well-nourished. No distress.  HENT:  Head: Normocephalic and atraumatic.  Mouth/Throat: Oropharynx is clear and moist. No oropharyngeal exudate.  Eyes: Conjunctivae and EOM are normal. Pupils are equal, round, and reactive to light. No scleral icterus.  Neck: Normal range of motion. Neck supple. No JVD present.  Cardiovascular: Normal rate, regular rhythm and normal heart sounds.  Exam reveals no gallop and no friction rub.   No murmur heard. Pulmonary/Chest: Effort normal and breath sounds normal. No respiratory distress. She has no wheezes. She has no rales. She exhibits no tenderness.    Abdominal: She exhibits no distension and no mass. There is no tenderness. There is no rebound and no guarding.  Musculoskeletal: She exhibits no edema and no tenderness.  Lymphadenopathy:    She has no cervical adenopathy.  Neurological: She is alert and oriented to person, place, and time. She exhibits normal muscle tone. Coordination normal.  Skin: Skin is warm and dry. Rash noted. She is not diaphoretic. No erythema. No pallor.     Psychiatric: She has a normal mood and affect. Her behavior is normal. Judgment and thought content normal.          Assessment & Plan:  HIV : Recheck viral load perfectly suppressed continue Atripla.  MRSA furuncles: Dizzy be draining component continue apply warm compresses we'll give her 10 day course of doxycycline.  Healthcare maintenance I will check screening mammography.  Rash: Possibly due to dry skin or contact dermatitis. Check vitamin D level and give trams alone course  HTN: will need BP medin future. Asked her to purchase a home blood pressure cuff

## 2012-03-24 NOTE — Patient Instructions (Addendum)
Cancel June visit reschedule for August

## 2012-03-25 ENCOUNTER — Ambulatory Visit: Payer: Self-pay | Admitting: Infectious Disease

## 2012-03-25 ENCOUNTER — Telehealth: Payer: Self-pay | Admitting: Licensed Clinical Social Worker

## 2012-03-25 ENCOUNTER — Telehealth: Payer: Self-pay | Admitting: Infectious Disease

## 2012-03-25 DIAGNOSIS — E559 Vitamin D deficiency, unspecified: Secondary | ICD-10-CM | POA: Insufficient documentation

## 2012-03-25 LAB — CBC WITH DIFFERENTIAL/PLATELET
Basophils Absolute: 0 10*3/uL (ref 0.0–0.1)
Basophils Relative: 1 % (ref 0–1)
Lymphocytes Relative: 45 % (ref 12–46)
MCHC: 34.3 g/dL (ref 30.0–36.0)
Monocytes Absolute: 0.4 10*3/uL (ref 0.1–1.0)
Neutro Abs: 2.3 10*3/uL (ref 1.7–7.7)
Platelets: 341 10*3/uL (ref 150–400)
RDW: 14.9 % (ref 11.5–15.5)
WBC: 5.2 10*3/uL (ref 4.0–10.5)

## 2012-03-25 LAB — MICROALBUMIN / CREATININE URINE RATIO
Creatinine, Urine: 168.4 mg/dL
Microalb, Ur: 0.55 mg/dL (ref 0.00–1.89)

## 2012-03-25 MED ORDER — CHOLECALCIFEROL 1.25 MG (50000 UT) PO CAPS: 50000.0000 [IU] | ORAL_CAPSULE | ORAL | Status: DC

## 2012-03-25 NOTE — Telephone Encounter (Signed)
[name] can you let Julia Molina know that her vitamin D levels WERE low.  I would like her to take 50 thousand units of vitamin d once A week FOR NEXT TWO MONTHS  (I have called in a script for this)  After that 2 months is UP she should take vitamin D 800 Units Over the counter vitamin EVERY DAY  This may help with her skin conditions

## 2012-03-25 NOTE — Telephone Encounter (Signed)
I called patient this morning to let her know that she has to call [facility] to schedule for mammogram and talk to [place] at [phone] to see if she qualifies for a scholarship. She will need to answer any questions that she may have.

## 2012-03-26 LAB — HIV-1 RNA QUANT-NO REFLEX-BLD
HIV 1 RNA Quant: <20 copies/mL (ref <20)
HIV-1 RNA Quant, Log: <1.3 {Log} (ref <1.30)

## 2012-03-26 LAB — T-HELPER CELL (CD4) - (RCID CLINIC ONLY): CD4 T Cell Abs: 780 uL (ref 400–2700)

## 2012-03-29 LAB — VITAMIN D 1,25 DIHYDROXY: Vitamin D3 1, 25 (OH)2: 61 pg/mL

## 2012-05-06 ENCOUNTER — Encounter: Payer: Self-pay | Admitting: *Deleted

## 2012-05-11 ENCOUNTER — Other Ambulatory Visit: Payer: Self-pay | Admitting: Licensed Clinical Social Worker

## 2012-05-11 DIAGNOSIS — B20 Human immunodeficiency virus [HIV] disease: Secondary | ICD-10-CM

## 2012-05-11 MED ORDER — EFAVIRENZ-EMTRICITAB-TENOFOVIR 600-200-300 MG PO TABS: 1.0000 | ORAL_TABLET | Freq: Every day | ORAL | Status: DC

## 2012-06-23 ENCOUNTER — Other Ambulatory Visit: Payer: Self-pay

## 2012-07-07 ENCOUNTER — Ambulatory Visit: Payer: Self-pay | Admitting: Infectious Disease

## 2012-07-12 ENCOUNTER — Ambulatory Visit: Payer: Self-pay | Admitting: Infectious Disease

## 2012-08-26 ENCOUNTER — Other Ambulatory Visit (INDEPENDENT_AMBULATORY_CARE_PROVIDER_SITE_OTHER): Payer: Self-pay

## 2012-08-26 DIAGNOSIS — Z113 Encounter for screening for infections with a predominantly sexual mode of transmission: Secondary | ICD-10-CM

## 2012-08-26 DIAGNOSIS — B20 Human immunodeficiency virus [HIV] disease: Secondary | ICD-10-CM

## 2012-08-26 LAB — CBC WITH DIFFERENTIAL/PLATELET
Basophils Absolute: 0 10*3/uL (ref 0.0–0.1)
Lymphocytes Relative: 53 % — ABNORMAL HIGH (ref 12–46)
Neutro Abs: 1.5 10*3/uL — ABNORMAL LOW (ref 1.7–7.7)
Platelets: 321 10*3/uL (ref 150–400)
RDW: 15.4 % (ref 11.5–15.5)
WBC: 4.4 10*3/uL (ref 4.0–10.5)

## 2012-08-26 LAB — COMPLETE METABOLIC PANEL WITH GFR
ALT: 13 U/L (ref 0–35)
Alkaline Phosphatase: 85 U/L (ref 39–117)
GFR, Est Non African American: 84 mL/min
Glucose, Bld: 95 mg/dL (ref 70–99)
Sodium: 137 mEq/L (ref 135–145)
Total Bilirubin: 0.3 mg/dL (ref 0.3–1.2)
Total Protein: 7.6 g/dL (ref 6.0–8.3)

## 2012-08-27 LAB — LIPID PANEL
HDL: 38 mg/dL — ABNORMAL LOW (ref >39)
LDL Cholesterol: 151 mg/dL — ABNORMAL HIGH (ref 0–99)
Total CHOL/HDL Ratio: 5.8 Ratio
VLDL: 33 mg/dL (ref 0–40)

## 2012-08-27 LAB — HIV-1 RNA QUANT-NO REFLEX-BLD: HIV-1 RNA Quant, Log: <1.3 {Log} (ref <1.30)

## 2012-08-27 LAB — T-HELPER CELL (CD4) - (RCID CLINIC ONLY): CD4 % Helper T Cell: 29 % — ABNORMAL LOW (ref 33–55)

## 2012-09-08 ENCOUNTER — Encounter: Payer: Self-pay | Admitting: Infectious Disease

## 2012-09-08 ENCOUNTER — Ambulatory Visit (INDEPENDENT_AMBULATORY_CARE_PROVIDER_SITE_OTHER): Payer: Self-pay | Admitting: Infectious Disease

## 2012-09-08 ENCOUNTER — Telehealth: Payer: Self-pay | Admitting: *Deleted

## 2012-09-08 VITALS — BP 146/98 | HR 88 | Temp 98.4°F | Ht 67.0 in | Wt 177.5 lb

## 2012-09-08 DIAGNOSIS — B20 Human immunodeficiency virus [HIV] disease: Secondary | ICD-10-CM

## 2012-09-08 DIAGNOSIS — R030 Elevated blood-pressure reading, without diagnosis of hypertension: Secondary | ICD-10-CM

## 2012-09-08 DIAGNOSIS — I10 Essential (primary) hypertension: Secondary | ICD-10-CM

## 2012-09-08 DIAGNOSIS — K047 Periapical abscess without sinus: Secondary | ICD-10-CM | POA: Insufficient documentation

## 2012-09-08 DIAGNOSIS — E559 Vitamin D deficiency, unspecified: Secondary | ICD-10-CM

## 2012-09-08 LAB — MICROALBUMIN / CREATININE URINE RATIO
Creatinine, Urine: 184.1 mg/dL
Microalb, Ur: 1.6 mg/dL (ref 0.00–1.89)

## 2012-09-08 MED ORDER — OXYCODONE-ACETAMINOPHEN 5-325 MG PO TABS: 1.0000 | ORAL_TABLET | Freq: Two times a day (BID) | ORAL | Status: DC | PRN

## 2012-09-08 MED ORDER — AMOXICILLIN-POT CLAVULANATE 875-125 MG PO TABS: 1.0000 | ORAL_TABLET | Freq: Two times a day (BID) | ORAL | Status: DC

## 2012-09-08 MED ORDER — CHOLECALCIFEROL 1.25 MG (50000 UT) PO CAPS: 50000.0000 [IU] | ORAL_CAPSULE | ORAL | Status: DC

## 2012-09-08 NOTE — Telephone Encounter (Signed)
Find to switch to their suggested regimen

## 2012-09-08 NOTE — Telephone Encounter (Signed)
[facility] left message that Ergocalciferol preparation is less expensive than the original prescription sent today.  MD, please advise about changing rx.

## 2012-09-08 NOTE — Progress Notes (Signed)
  Subjective:    Patient ID: Julia Molina, female    DOB: [birth year], 47 y.o.   MRN: [id number]  Dental Pain  This is a new problem. The current episode started more than 1 month ago. The problem occurs every few hours. The problem has been gradually worsening. The pain is at a severity of 4/10. The pain is moderate. Pertinent negatives include no fever or sinus pressure. She has tried acetaminophen for the symptoms. The treatment provided mild relief.  Abscess Pertinent negatives include no abdominal pain, arthralgias, chest pain, chills, congestion, coughing, diaphoresis, fatigue, fever, joint swelling, myalgias, nausea, rash, sore throat, vomiting or weakness.    47 y.o. female who is doing superbly well on her antiviral regimen, atripla with undetectable viral load and health cd4 count comes in with c/o dental pain and pain in gum left side upper. She is need of being seen by dentist and pain is not controlled. .     Review of Systems  Constitutional: Negative for fever, chills, diaphoresis, activity change, appetite change, fatigue and unexpected weight change.  HENT: Negative for congestion, sore throat, rhinorrhea, sneezing, trouble swallowing and sinus pressure.   Eyes: Negative for photophobia and visual disturbance.  Respiratory: Negative for cough, chest tightness, shortness of breath, wheezing and stridor.   Cardiovascular: Negative for chest pain, palpitations and leg swelling.  Gastrointestinal: Negative for nausea, vomiting, abdominal pain, diarrhea, constipation, blood in stool, abdominal distention and anal bleeding.  Genitourinary: Negative for dysuria, hematuria, flank pain and difficulty urinating.  Musculoskeletal: Negative for myalgias, back pain, joint swelling, arthralgias and gait problem.  Skin: Negative for color change, pallor, rash and wound.  Neurological: Negative for dizziness, tremors, weakness and light-headedness.  Hematological: Negative for adenopathy.  Does not bruise/bleed easily.  Psychiatric/Behavioral: Negative for behavioral problems, confusion, sleep disturbance, dysphoric mood, decreased concentration and agitation.       Objective:   Physical Exam  Constitutional: She is oriented to person, place, and time. She appears well-developed and well-nourished. No distress.  HENT:  Head: Normocephalic and atraumatic.  Mouth/Throat: Oropharynx is clear and moist. No oropharyngeal exudate.  Eyes: Conjunctivae and EOM are normal. Pupils are equal, round, and reactive to light. No scleral icterus.  Neck: Normal range of motion. Neck supple. No JVD present.  Cardiovascular: Normal rate, regular rhythm and normal heart sounds.  Exam reveals no gallop and no friction rub.   No murmur heard. Pulmonary/Chest: Effort normal and breath sounds normal. No respiratory distress. She has no wheezes. She has no rales. She exhibits no tenderness.  Abdominal: She exhibits no distension and no mass. There is no tenderness. There is no rebound and no guarding.  Musculoskeletal: She exhibits no edema and no tenderness.  Lymphadenopathy:    She has no cervical adenopathy.  Neurological: She is alert and oriented to person, place, and time. She exhibits normal muscle tone. Coordination normal.  Skin: Skin is warm and dry. She is not diaphoretic. No erythema. No pallor.     Psychiatric: She has a normal mood and affect. Her behavior is normal. Judgment and thought content normal.          Assessment & Plan:  HIV : perfect suppression   vitamin D low: send new rx for this or she may have to bpay over the counter    HTN: get home bp cuff, check Microalbumin creatinine ratio  Tooth infection: augmentin and percocet, dental referral

## 2012-09-17 MED ORDER — ERGOCALCIFEROL 1.25 MG (50000 UT) PO CAPS: 50000.0000 [IU] | ORAL_CAPSULE | ORAL | Status: DC

## 2012-09-17 NOTE — Addendum Note (Signed)
Addended by: Jennet Maduro D on: 09/17/2012 11:33 AM   Modules accepted: Orders

## 2012-11-09 ENCOUNTER — Telehealth: Payer: Self-pay | Admitting: *Deleted

## 2012-11-09 NOTE — Telephone Encounter (Signed)
She can have percocet or vicodin or whatever she was takign for her dental pain last visit. Dont know if they can be called in though?

## 2012-11-09 NOTE — Telephone Encounter (Signed)
Dental pain, "9" out of "10" finished ABX requesting dental appt, has completed paperwork  Requesting pain rx and possible additional antibiotic.  Wanting to know where she is on the "dental list."  RN will check with Dental Scheduler in [facility].   Dental scheduler has already called the pt and has given her an appt.

## 2012-11-10 ENCOUNTER — Telehealth: Payer: Self-pay | Admitting: *Deleted

## 2012-11-10 NOTE — Telephone Encounter (Signed)
Patient requesting refill of her augmentin d/t dental pain. Is this appropriate to refill?

## 2012-11-10 NOTE — Telephone Encounter (Signed)
When is she seeing dental? If she is then fine

## 2012-11-11 NOTE — Telephone Encounter (Signed)
Pt picked up a refill of her antibiotics (seems there was 1 refill available on the old rx), started them 10/8.  Pt will be seen by the dentist 10/20.  She states that her teeth are starting to feel better.  She knows to call us if anything changes between now and her dental appointment.

## 2012-11-15 ENCOUNTER — Telehealth: Payer: Self-pay

## 2012-11-15 NOTE — Telephone Encounter (Signed)
Pharmacy calling for refill of Augmentin .  Call in early October for refill per phone note.  Pt requesting medicaiton for possible tooth infection.  I will call patient for more details.

## 2012-11-19 ENCOUNTER — Telehealth: Payer: Self-pay | Admitting: *Deleted

## 2012-11-19 NOTE — Telephone Encounter (Signed)
Patient called c/o nasal and chest congestion x 7 days. Taking over the counter cold medication without relief. She has an appt with the dental clinic on Monday at 2:00 pm. Given appt with Dr. Orvan Falconer for 2:30 pm on Monday also.  Julia Molina

## 2012-11-22 ENCOUNTER — Ambulatory Visit (INDEPENDENT_AMBULATORY_CARE_PROVIDER_SITE_OTHER): Payer: Self-pay | Admitting: Internal Medicine

## 2012-11-22 VITALS — BP 143/91 | HR 76 | Temp 98.2°F | Ht 67.0 in | Wt 173.0 lb

## 2012-11-22 DIAGNOSIS — J209 Acute bronchitis, unspecified: Secondary | ICD-10-CM

## 2012-11-22 DIAGNOSIS — N760 Acute vaginitis: Secondary | ICD-10-CM | POA: Insufficient documentation

## 2012-11-22 DIAGNOSIS — Z23 Encounter for immunization: Secondary | ICD-10-CM

## 2012-11-22 MED ORDER — FLUCONAZOLE 100 MG PO TABS: 100.0000 mg | ORAL_TABLET | Freq: Every day | ORAL | Status: DC

## 2012-11-22 MED ORDER — OXYMETAZOLINE HCL 0.05 % NA SOLN: 2.0000 | Freq: Two times a day (BID) | NASAL | Status: AC

## 2012-11-22 NOTE — Progress Notes (Signed)
Patient ID: Julia Molina, female   DOB: Sep 26, 1965, 47 y.o.   MRN: [id number]          [facility]ious [facility]  Patient Active Problem List   Diagnosis Date Noted  . Acute bronchitis 11/22/2012  . Vaginitis and vulvovaginitis 11/22/2012  . Dental abscess 09/08/2012  . Unspecified vitamin D deficiency 03/25/2012  . Boils 03/24/2012  . Hyperlipidemia 01/07/2012  . Smoker 07/02/2011  . Amenorrhea 03/05/2011  . 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 ELEVATED BLOOD PRESSURE 10/06/2008  . SKIN RASH 05/05/2007  . ANEMIA-IRON DEFICIENCY 11/27/2006  . Human Immunodeficiency Virus (HIV) Disease 11/02/2006    Patient's Medications  New Prescriptions   FLUCONAZOLE (DIFLUCAN) 100 MG TABLET    Take 1 tablet (100 mg total) by mouth daily.   OXYMETAZOLINE (AFRIN NASAL SPRAY) 0.05 % NASAL SPRAY    Place 2 sprays into the nose 2 (two) times daily.  Previous Medications   AMOXICILLIN-CLAVULANATE (AUGMENTIN) 875-125 MG PER TABLET    Take 1 tablet by mouth 2 (two) times daily.   EFAVIRENZ-EMTRICITABINE-TENOFOVIR (ATRIPLA) 600-200-300 MG PER TABLET    Take 1 tablet by mouth at bedtime.   ERGOCALCIFEROL (VITAMIN D2) 50000 UNITS CAPSULE    Take 1 capsule (50,000 Units total) by mouth once a week.   IRON POLYSACCHARIDES (NIFEREX) 150 MG CAPSULE    Take 1 capsule (150 mg total) by mouth 2 (two) times daily.   OXYCODONE-ACETAMINOPHEN (PERCOCET/ROXICET) 5-325 MG PER TABLET    Take 1 tablet by mouth every 12 (twelve) hours as needed for pain.   PRAVASTATIN (PRAVACHOL) 20 MG TABLET    Take 1 tablet (20 mg total) by mouth daily.   TRIAMCINOLONE OINTMENT (KENALOG) 0.5 %    Apply topically 2 (two) times daily.  Modified Medications   No medications on file  Discontinued Medications   No medications on file    Subjective: Julia Molina is seen on a work in basis today. About 10 days ago she developed some sinus congestion and cough. She had some subjective fever and one chill. She tried taking  some over-the-counter sinus pills and felt a little bit better but her symptoms have persisted and seemed to have moved down into her chest. She has some postnasal drip and occasionally productive cough. She is not having any chest pain, wheezing or shortness of breath. She has cut down on her cigarettes and is down to 2-3 daily. Has not missed any doses of her Atripla.  She had been on Augmentin for recent dental pain. She stopped taking it 3 days ago after she developed some vaginal itching and discharge. She was seen in our dental clinic today and is scheduled to have several teeth removed. She's not having any current pain.  Review of Systems: Pertinent items are noted in HPI.  Past Medical History  Diagnosis Date  . HIV (human immunodeficiency virus infection)     History  Substance Use Topics  . Smoking status: Current Every Day Smoker -- 0.30 packs/day    Types: Cigarettes  . Smokeless tobacco: Never Used  . Alcohol Use: No    No family history on file.  No Known Allergies  Objective: Temp: 98.2 F (36.8 C) (10/20 1502) BP: 143/91 mmHg (10/20 1502) Pulse Rate: 76 (10/20 1502)  General: She has a nonproductive cough but is otherwise in no distress and in good spirits Oral: No oropharyngeal lesions Lungs: Clear Cor: Regular S1 and S2 no murmurs  Lab Results HIV 1 RNA Quant (  copies/mL)  Date Value  08/26/2012 <20   03/24/2012 <20   12/29/2011 23*     CD4 T Cell Abs (cmm)  Date Value  08/26/2012 660   03/24/2012 780   12/29/2011 700      Assessment: She has acute bronchitis and possible sinusitis that is probably viral in origin. She has no evidence of pneumonia or asthma. I will have her use Afrin nasal spray for the next 3-4 days. I do not see an indication for an antibiotic. I have encouraged her to quit smoking cigarettes completely. She probably has candidal vaginitis related to her recent Augmentin therapy. I will give her 3 days of  fluconazole.  Plan: 1. Afrin nasal spray 2 puffs in each nostril twice daily for the next 3-4 days 2. Fluconazole 100 mg daily for 3 days 3. Continue Atripla 4. Followup at previously scheduled appointment with Dr. Algis Liming early next year   Cliffton Asters, MD [facility] for Infectious Disease [facility] [phone] pager   [phone] cell 11/22/2012, 3:10 PM

## 2012-11-23 ENCOUNTER — Other Ambulatory Visit: Payer: Self-pay | Admitting: *Deleted

## 2012-11-23 DIAGNOSIS — B20 Human immunodeficiency virus [HIV] disease: Secondary | ICD-10-CM

## 2012-11-23 MED ORDER — EFAVIRENZ-EMTRICITAB-TENOFOVIR 600-200-300 MG PO TABS: 1.0000 | ORAL_TABLET | Freq: Every day | ORAL | Status: DC

## 2012-11-25 NOTE — Telephone Encounter (Signed)
Pt was seen in Dental Clinic the day she called in about the pain.   Julia Molina

## 2012-12-01 NOTE — Telephone Encounter (Signed)
She can have a 10 day course of the narcotic rx at last visit. Bu anything beyond that I would need an explanation from her dentist for

## 2012-12-09 ENCOUNTER — Telehealth: Payer: Self-pay | Admitting: *Deleted

## 2012-12-09 ENCOUNTER — Other Ambulatory Visit: Payer: Self-pay | Admitting: Internal Medicine

## 2012-12-09 NOTE — Telephone Encounter (Signed)
Pt having sleep problems.  Wanting to discuss the possibility of medication,  Pt given appt.

## 2012-12-29 ENCOUNTER — Telehealth: Payer: Self-pay | Admitting: *Deleted

## 2012-12-29 NOTE — Telephone Encounter (Signed)
Left message offering to do PAP smear when patient comes for MD visit on 01/06/13.

## 2013-01-06 ENCOUNTER — Ambulatory Visit: Payer: Self-pay | Admitting: Infectious Disease

## 2013-01-10 ENCOUNTER — Other Ambulatory Visit: Payer: Self-pay | Admitting: *Deleted

## 2013-01-10 ENCOUNTER — Telehealth: Payer: Self-pay | Admitting: *Deleted

## 2013-01-10 DIAGNOSIS — K047 Periapical abscess without sinus: Secondary | ICD-10-CM

## 2013-01-10 DIAGNOSIS — E785 Hyperlipidemia, unspecified: Secondary | ICD-10-CM

## 2013-01-10 MED ORDER — PRAVASTATIN SODIUM 20 MG PO TABS: 20.0000 mg | ORAL_TABLET | Freq: Every day | ORAL | Status: DC

## 2013-01-10 MED ORDER — AMOXICILLIN-POT CLAVULANATE 875-125 MG PO TABS: 1.0000 | ORAL_TABLET | Freq: Two times a day (BID) | ORAL | Status: DC

## 2013-01-10 NOTE — Telephone Encounter (Signed)
That is fine thankx

## 2013-01-10 NOTE — Telephone Encounter (Signed)
Patient called requesting a refill of her augmentin be phoned into the [facility] in [place] for the return of her dental abscess.  RN left message with dental clinic to see if they can work her in this month. Andree Coss, RN

## 2013-03-04 ENCOUNTER — Encounter: Payer: Self-pay | Admitting: *Deleted

## 2013-03-21 ENCOUNTER — Other Ambulatory Visit (INDEPENDENT_AMBULATORY_CARE_PROVIDER_SITE_OTHER): Payer: Self-pay

## 2013-03-21 DIAGNOSIS — B20 Human immunodeficiency virus [HIV] disease: Secondary | ICD-10-CM

## 2013-03-21 DIAGNOSIS — Z79899 Other long term (current) drug therapy: Secondary | ICD-10-CM

## 2013-03-21 DIAGNOSIS — Z113 Encounter for screening for infections with a predominantly sexual mode of transmission: Secondary | ICD-10-CM

## 2013-03-21 LAB — COMPLETE METABOLIC PANEL WITHOUT GFR
ALT: 27 U/L (ref 0–35)
AST: 22 U/L (ref 0–37)
Albumin: 4.1 g/dL (ref 3.5–5.2)
Alkaline Phosphatase: 93 U/L (ref 39–117)
BUN: 16 mg/dL (ref 6–23)
CO2: 20 meq/L (ref 19–32)
Calcium: 9.1 mg/dL (ref 8.4–10.5)
Chloride: 111 meq/L (ref 96–112)
Creat: 0.7 mg/dL (ref 0.50–1.10)
GFR, Est African American: >89 mL/min
GFR, Est Non African American: >89 mL/min
Glucose, Bld: 109 mg/dL — ABNORMAL HIGH (ref 70–99)
Potassium: 4.1 meq/L (ref 3.5–5.3)
Sodium: 140 meq/L (ref 135–145)
Total Bilirubin: 0.2 mg/dL (ref 0.2–1.2)
Total Protein: 7.3 g/dL (ref 6.0–8.3)

## 2013-03-21 LAB — LIPID PANEL
Cholesterol: 182 mg/dL (ref 0–200)
HDL: 33 mg/dL — ABNORMAL LOW (ref >39)
LDL CALC: 106 mg/dL — AB (ref 0–99)
Total CHOL/HDL Ratio: 5.5 Ratio
Triglycerides: 216 mg/dL — ABNORMAL HIGH (ref <150)
VLDL: 43 mg/dL — AB (ref 0–40)

## 2013-03-21 LAB — CBC WITH DIFFERENTIAL/PLATELET
BASOS ABS: 0 10*3/uL (ref 0.0–0.1)
BASOS PCT: 0 % (ref 0–1)
EOS PCT: 4 % (ref 0–5)
Eosinophils Absolute: 0.2 10*3/uL (ref 0.0–0.7)
HCT: 34.5 % — ABNORMAL LOW (ref 36.0–46.0)
Hemoglobin: 11.7 g/dL — ABNORMAL LOW (ref 12.0–15.0)
Lymphocytes Relative: 37 % (ref 12–46)
Lymphs Abs: 2 10*3/uL (ref 0.7–4.0)
MCH: 28.2 pg (ref 26.0–34.0)
MCHC: 33.9 g/dL (ref 30.0–36.0)
MCV: 83.1 fL (ref 78.0–100.0)
Monocytes Absolute: 0.4 10*3/uL (ref 0.1–1.0)
Monocytes Relative: 8 % (ref 3–12)
NEUTROS ABS: 2.8 10*3/uL (ref 1.7–7.7)
Neutrophils Relative %: 51 % (ref 43–77)
Platelets: 349 10*3/uL (ref 150–400)
RBC: 4.15 MIL/uL (ref 3.87–5.11)
RDW: 14.5 % (ref 11.5–15.5)
WBC: 5.4 10*3/uL (ref 4.0–10.5)

## 2013-03-21 LAB — RPR

## 2013-03-21 LAB — PHOSPHORUS: Phosphorus: 3.3 mg/dL (ref 2.3–4.6)

## 2013-03-21 LAB — HEPATITIS C ANTIBODY: HCV AB: NEGATIVE

## 2013-03-21 NOTE — Addendum Note (Signed)
Addended by: Mariea ClontsGREEN, Ranon Coven D on: 03/21/2013 09:13 AM   Modules accepted: Orders

## 2013-03-22 LAB — T-HELPER CELL (CD4) - (RCID CLINIC ONLY)
CD4 % Helper T Cell: 33 % (ref 33–55)
CD4 T Cell Abs: 680 /uL (ref 400–2700)

## 2013-03-22 LAB — URINE CYTOLOGY ANCILLARY ONLY
CHLAMYDIA, DNA PROBE: NEGATIVE
Neisseria Gonorrhea: NEGATIVE

## 2013-03-22 LAB — MICROALBUMIN / CREATININE URINE RATIO
CREATININE, URINE: 120.2 mg/dL
MICROALB/CREAT RATIO: 4.2 mg/g (ref 0.0–30.0)
Microalb, Ur: 0.5 mg/dL (ref 0.00–1.89)

## 2013-03-23 LAB — HIV-1 RNA QUANT-NO REFLEX-BLD: HIV 1 RNA Quant: <20 copies/mL (ref <20)

## 2013-04-04 ENCOUNTER — Ambulatory Visit (INDEPENDENT_AMBULATORY_CARE_PROVIDER_SITE_OTHER): Payer: Self-pay | Admitting: Infectious Disease

## 2013-04-04 ENCOUNTER — Encounter: Payer: Self-pay | Admitting: Infectious Disease

## 2013-04-04 VITALS — BP 113/77 | HR 99 | Temp 97.9°F | Wt 181.0 lb

## 2013-04-04 DIAGNOSIS — L0292 Furuncle, unspecified: Secondary | ICD-10-CM

## 2013-04-04 DIAGNOSIS — R030 Elevated blood-pressure reading, without diagnosis of hypertension: Secondary | ICD-10-CM

## 2013-04-04 DIAGNOSIS — L0293 Carbuncle, unspecified: Secondary | ICD-10-CM

## 2013-04-04 DIAGNOSIS — B20 Human immunodeficiency virus [HIV] disease: Secondary | ICD-10-CM

## 2013-04-04 DIAGNOSIS — G4700 Insomnia, unspecified: Secondary | ICD-10-CM

## 2013-04-04 DIAGNOSIS — R21 Rash and other nonspecific skin eruption: Secondary | ICD-10-CM

## 2013-04-04 MED ORDER — TRAZODONE HCL 50 MG PO TABS: 50.0000 mg | ORAL_TABLET | Freq: Every day | ORAL | Status: DC

## 2013-04-04 MED ORDER — TRIAMCINOLONE ACETONIDE 0.5 % EX OINT: TOPICAL_OINTMENT | Freq: Two times a day (BID) | CUTANEOUS | Status: DC

## 2013-04-04 MED ORDER — EMTRICITAB-RILPIVIR-TENOFOV DF 200-25-300 MG PO TABS: 1.0000 | ORAL_TABLET | Freq: Every day | ORAL | Status: DC

## 2013-04-04 NOTE — Progress Notes (Signed)
  Subjective:    Patient ID: Julia Molina, female    DOB: 17-Mar-1965, 48 y.o.   MRN: [id number]  HPI  48 y.o. female who is doing superbly well on her antiviral regimen, atripla with undetectable viral load and health cd4 count had several dental procedures performed recently. Her blood pressure seems much better now that her dental pain is improved.  She is suffering from some insomnia and is feeling quite fatigued.  We discussed change of Atripla 2 different antiretroviral regimen to see if this would help with her insomnia and fatigue and [facility]    Review of Systems  Constitutional: Negative for activity change, appetite change and unexpected weight change.  HENT: Negative for rhinorrhea, sneezing and trouble swallowing.   Eyes: Negative for photophobia and visual disturbance.  Respiratory: Negative for chest tightness, shortness of breath, wheezing and stridor.   Cardiovascular: Negative for palpitations and leg swelling.  Gastrointestinal: Negative for diarrhea, constipation, blood in stool, abdominal distention and anal bleeding.  Genitourinary: Negative for dysuria, hematuria, flank pain and difficulty urinating.  Musculoskeletal: Negative for back pain and gait problem.  Skin: Negative for color change, pallor and wound.  Neurological: Negative for dizziness, tremors and light-headedness.  Hematological: Negative for adenopathy. Does not bruise/bleed easily.  Psychiatric/Behavioral: Negative for behavioral problems, confusion, sleep disturbance, dysphoric mood, decreased concentration and agitation.       Objective:   Physical Exam  Constitutional: She is oriented to person, place, and time. She appears well-developed and well-nourished. No distress.  HENT:  Head: Normocephalic and atraumatic.  Mouth/Throat: Oropharynx is clear and moist. No oropharyngeal exudate.  Eyes: Conjunctivae and EOM are normal. Pupils are equal, round, and reactive to light. No  scleral icterus.  Neck: Normal range of motion. Neck supple. No JVD present.  Cardiovascular: Normal rate, regular rhythm and normal heart sounds.  Exam reveals no gallop and no friction rub.   No murmur heard. Pulmonary/Chest: Effort normal and breath sounds normal. No respiratory distress. She has no wheezes. She has no rales. She exhibits no tenderness.  Abdominal: She exhibits no distension and no mass. There is no tenderness. There is no rebound and no guarding.  Musculoskeletal: She exhibits no edema and no tenderness.  Lymphadenopathy:    She has no cervical adenopathy.  Neurological: She is alert and oriented to person, place, and time. She exhibits normal muscle tone. Coordination normal.  Skin: Skin is warm and dry. She is not diaphoretic. No erythema. No pallor.     Psychiatric: She has a normal mood and affect. Her behavior is normal. Judgment and thought content normal.          Assessment & Plan:  HIV : perfect suppression, but change to COMPLERA and recheck viral load and CD4 count in one month's time.  She claims that she has had adap report filed by [facility]   Vitamin D low:  need to take a low-dose vitamin D supplementation   HTN:  blood pressure is better now with teeth having been addressed   dental problems: See dental next week.  Insomnia: Start trazodone 50 mg at bedtime see if he changed on the [facility]

## 2013-05-02 ENCOUNTER — Other Ambulatory Visit: Payer: Self-pay

## 2013-05-11 ENCOUNTER — Other Ambulatory Visit (INDEPENDENT_AMBULATORY_CARE_PROVIDER_SITE_OTHER): Payer: Self-pay

## 2013-05-11 ENCOUNTER — Ambulatory Visit: Payer: Self-pay | Admitting: Infectious Disease

## 2013-05-11 DIAGNOSIS — B20 Human immunodeficiency virus [HIV] disease: Secondary | ICD-10-CM

## 2013-05-11 LAB — COMPLETE METABOLIC PANEL WITH GFR
ALT: 11 U/L (ref 0–35)
AST: 14 U/L (ref 0–37)
Albumin: 4.1 g/dL (ref 3.5–5.2)
Alkaline Phosphatase: 84 U/L (ref 39–117)
BILIRUBIN TOTAL: 0.2 mg/dL (ref 0.2–1.2)
BUN: 12 mg/dL (ref 6–23)
CALCIUM: 9.1 mg/dL (ref 8.4–10.5)
CO2: 21 mEq/L (ref 19–32)
CREATININE: 0.89 mg/dL (ref 0.50–1.10)
Chloride: 106 mEq/L (ref 96–112)
GFR, EST AFRICAN AMERICAN: 89 mL/min
GFR, Est Non African American: 77 mL/min
Glucose, Bld: 98 mg/dL (ref 70–99)
Potassium: 3.8 mEq/L (ref 3.5–5.3)
Sodium: 136 mEq/L (ref 135–145)
Total Protein: 7 g/dL (ref 6.0–8.3)

## 2013-05-12 ENCOUNTER — Other Ambulatory Visit: Payer: Self-pay

## 2013-05-12 LAB — T-HELPER CELL (CD4) - (RCID CLINIC ONLY)
CD4 T CELL ABS: 970 /uL (ref 400–2700)
CD4 T CELL HELPER: 33 % (ref 33–55)

## 2013-05-13 LAB — HIV-1 RNA QUANT-NO REFLEX-BLD: HIV-1 RNA Quant, Log: <1.3 {Log} (ref <1.30)

## 2013-05-19 LAB — HLA B*5701: HLA-B 5701 W/RFLX HLA-B HIGH: NEGATIVE

## 2013-06-15 ENCOUNTER — Ambulatory Visit (INDEPENDENT_AMBULATORY_CARE_PROVIDER_SITE_OTHER): Payer: Self-pay | Admitting: Infectious Disease

## 2013-06-15 ENCOUNTER — Encounter: Payer: Self-pay | Admitting: Infectious Disease

## 2013-06-15 VITALS — BP 113/76 | HR 98 | Temp 97.9°F | Ht 67.0 in | Wt 190.0 lb

## 2013-06-15 DIAGNOSIS — M549 Dorsalgia, unspecified: Secondary | ICD-10-CM

## 2013-06-15 DIAGNOSIS — E559 Vitamin D deficiency, unspecified: Secondary | ICD-10-CM

## 2013-06-15 DIAGNOSIS — B20 Human immunodeficiency virus [HIV] disease: Secondary | ICD-10-CM

## 2013-06-15 DIAGNOSIS — I10 Essential (primary) hypertension: Secondary | ICD-10-CM

## 2013-06-15 DIAGNOSIS — Z0000 Encounter for general adult medical examination without abnormal findings: Secondary | ICD-10-CM

## 2013-06-15 DIAGNOSIS — G4700 Insomnia, unspecified: Secondary | ICD-10-CM

## 2013-06-15 DIAGNOSIS — IMO0001 Reserved for inherently not codable concepts without codable children: Secondary | ICD-10-CM

## 2013-06-15 DIAGNOSIS — M25519 Pain in unspecified shoulder: Secondary | ICD-10-CM

## 2013-06-15 NOTE — Progress Notes (Signed)
  Subjective:    Patient ID: Julia Molina, female    DOB: [birth year], 48 y.o.   MRN: [id number]  HPI   48 y.o. female who is doing superbly well on her antiviral regimen, [place] undetectable viral load and health cd4 count.   Insomnia fatigue have improved.  She is c.o of left shoulder pain and back pain that is not responding to tylenol and I suggested NSAIDs with hydration    Review of Systems  Constitutional: Negative for activity change, appetite change and unexpected weight change.  HENT: Negative for rhinorrhea, sneezing and trouble swallowing.   Eyes: Negative for photophobia and visual disturbance.  Respiratory: Negative for chest tightness, shortness of breath, wheezing and stridor.   Cardiovascular: Negative for palpitations and leg swelling.  Gastrointestinal: Negative for diarrhea, constipation, blood in stool, abdominal distention and anal bleeding.  Genitourinary: Negative for dysuria, hematuria, flank pain and difficulty urinating.  Musculoskeletal: Positive for myalgias. Negative for back pain and gait problem.  Skin: Negative for color change, pallor and wound.  Neurological: Negative for dizziness, tremors and light-headedness.  Hematological: Negative for adenopathy. Does not bruise/bleed easily.  Psychiatric/Behavioral: Negative for behavioral problems, confusion, sleep disturbance, dysphoric mood, decreased concentration and agitation.       Objective:   Physical Exam  Constitutional: She is oriented to person, place, and time. She appears well-developed and well-nourished. No distress.  HENT:  Head: Normocephalic and atraumatic.  Mouth/Throat: Oropharynx is clear and moist. No oropharyngeal exudate.  Eyes: Conjunctivae and EOM are normal. Pupils are equal, round, and reactive to light. No scleral icterus.  Neck: Normal range of motion. Neck supple. No JVD present.  Cardiovascular: Normal rate, regular rhythm and  normal heart sounds.  Exam reveals no gallop and no friction rub.   No murmur heard. Pulmonary/Chest: Effort normal and breath sounds normal. No respiratory distress. She has no wheezes. She has no rales. She exhibits no tenderness.  Abdominal: She exhibits no distension and no mass. There is no tenderness. There is no rebound and no guarding.  Musculoskeletal: She exhibits no edema.       Left shoulder: She exhibits tenderness.  Lymphadenopathy:    She has no cervical adenopathy.  Neurological: She is alert and oriented to person, place, and time. She exhibits normal muscle tone. Coordination normal.  Skin: Skin is warm and dry. She is not diaphoretic. No erythema. No pallor.     Psychiatric: She has a normal mood and affect. Her behavior is normal. Judgment and thought content normal.          Assessment & Plan:  HIV : perfect suppression, continue COMPLERA and recheck viral load and CD4 count in 6 months. I spent greater than 40 minutes with the patient including greater than 50% of time in face to face counsel of the patient and in coordination of their care.  Need for pap smear: pap smear performed by Jennet Maduroenise Estridge during visit  Vitamin D low:  need to take a low-dose vitamin D supplementation   HTN:  blood pressure  Normal now . Smoking: spent > 3minutes with smoking cessation counselling Insomnia:trazodone 50 mg at bedtime   Shoulder pain: plain films when she has orange card, NSAIds with hydration

## 2013-07-15 ENCOUNTER — Ambulatory Visit: Payer: Self-pay

## 2013-07-15 ENCOUNTER — Telehealth: Payer: Self-pay | Admitting: *Deleted

## 2013-07-15 NOTE — Telephone Encounter (Signed)
Requested pt call [facility] for a new PAP smear appt. 

## 2013-07-29 ENCOUNTER — Ambulatory Visit: Payer: Self-pay

## 2013-08-19 ENCOUNTER — Ambulatory Visit: Payer: Self-pay

## 2013-09-02 ENCOUNTER — Other Ambulatory Visit (HOSPITAL_COMMUNITY): Admission: RE | Admit: 2013-09-02 | Payer: Self-pay | Source: Ambulatory Visit | Admitting: Infectious Disease

## 2013-09-02 ENCOUNTER — Ambulatory Visit (INDEPENDENT_AMBULATORY_CARE_PROVIDER_SITE_OTHER): Payer: Self-pay | Admitting: *Deleted

## 2013-09-02 DIAGNOSIS — Z124 Encounter for screening for malignant neoplasm of cervix: Secondary | ICD-10-CM

## 2013-09-02 DIAGNOSIS — Z113 Encounter for screening for infections with a predominantly sexual mode of transmission: Secondary | ICD-10-CM

## 2013-09-02 NOTE — Progress Notes (Signed)
   Subjective:     Julia Molina is a 48 y.o. woman who comes in today for a  pap smear only.  Previous abnormal Pap smears: no. Contraception: dental dams given to the pt.  Objective:  LMP 07/29/13 Pelvic Exam: . Pap smear obtained.   Assessment:    Screening pap smear.   Plan:    Follow up in one year, or as indicated by Pap results.   Pt given educational materials re: HIV and women, self-esteem, BSE, nutrition and diet management, PAP smears and partner safety. Pt given condoms and dental dams.

## 2013-09-02 NOTE — Patient Instructions (Signed)
Your results will be ready in about a week.  I will mail them to you.  Thank you for coming to the Center for your care.  Denise,  RN 

## 2013-09-05 LAB — CYTOLOGY - PAP

## 2013-09-07 ENCOUNTER — Encounter: Payer: Self-pay | Admitting: *Deleted

## 2013-10-22 ENCOUNTER — Other Ambulatory Visit: Payer: Self-pay | Admitting: Internal Medicine

## 2013-12-07 ENCOUNTER — Other Ambulatory Visit: Payer: Self-pay

## 2013-12-07 ENCOUNTER — Ambulatory Visit: Payer: Self-pay

## 2013-12-21 ENCOUNTER — Ambulatory Visit: Payer: Self-pay | Admitting: Infectious Disease

## 2013-12-21 ENCOUNTER — Other Ambulatory Visit: Payer: Self-pay | Admitting: Infectious Disease

## 2014-01-16 ENCOUNTER — Other Ambulatory Visit (INDEPENDENT_AMBULATORY_CARE_PROVIDER_SITE_OTHER): Payer: Self-pay

## 2014-01-16 ENCOUNTER — Ambulatory Visit (INDEPENDENT_AMBULATORY_CARE_PROVIDER_SITE_OTHER): Payer: Self-pay | Admitting: *Deleted

## 2014-01-16 ENCOUNTER — Ambulatory Visit: Payer: Self-pay

## 2014-01-16 DIAGNOSIS — Z113 Encounter for screening for infections with a predominantly sexual mode of transmission: Secondary | ICD-10-CM

## 2014-01-16 DIAGNOSIS — Z23 Encounter for immunization: Secondary | ICD-10-CM

## 2014-01-16 DIAGNOSIS — Z79899 Other long term (current) drug therapy: Secondary | ICD-10-CM

## 2014-01-16 DIAGNOSIS — B20 Human immunodeficiency virus [HIV] disease: Secondary | ICD-10-CM

## 2014-01-16 LAB — COMPLETE METABOLIC PANEL WITH GFR
ALBUMIN: 4.6 g/dL (ref 3.5–5.2)
ALT: 14 U/L (ref 0–35)
AST: 17 U/L (ref 0–37)
Alkaline Phosphatase: 78 U/L (ref 39–117)
BUN: 12 mg/dL (ref 6–23)
CALCIUM: 10 mg/dL (ref 8.4–10.5)
CHLORIDE: 105 meq/L (ref 96–112)
CO2: 23 meq/L (ref 19–32)
Creat: 0.96 mg/dL (ref 0.50–1.10)
GFR, Est African American: 81 mL/min
GFR, Est Non African American: 70 mL/min
Glucose, Bld: 100 mg/dL — ABNORMAL HIGH (ref 70–99)
Potassium: 4.4 mEq/L (ref 3.5–5.3)
Sodium: 138 mEq/L (ref 135–145)
Total Bilirubin: 0.3 mg/dL (ref 0.2–1.2)
Total Protein: 8 g/dL (ref 6.0–8.3)

## 2014-01-16 LAB — CBC WITH DIFFERENTIAL/PLATELET
BASOS ABS: 0 10*3/uL (ref 0.0–0.1)
BASOS PCT: 0 % (ref 0–1)
Eosinophils Absolute: 0.2 10*3/uL (ref 0.0–0.7)
Eosinophils Relative: 4 % (ref 0–5)
HCT: 41.1 % (ref 36.0–46.0)
Hemoglobin: 14 g/dL (ref 12.0–15.0)
Lymphocytes Relative: 55 % — ABNORMAL HIGH (ref 12–46)
Lymphs Abs: 2.9 10*3/uL (ref 0.7–4.0)
MCH: 29 pg (ref 26.0–34.0)
MCHC: 34.1 g/dL (ref 30.0–36.0)
MCV: 85.1 fL (ref 78.0–100.0)
MPV: 8.9 fL — AB (ref 9.4–12.4)
Monocytes Absolute: 0.3 10*3/uL (ref 0.1–1.0)
Monocytes Relative: 6 % (ref 3–12)
NEUTROS ABS: 1.9 10*3/uL (ref 1.7–7.7)
Neutrophils Relative %: 35 % — ABNORMAL LOW (ref 43–77)
Platelets: 376 10*3/uL (ref 150–400)
RBC: 4.83 MIL/uL (ref 3.87–5.11)
RDW: 14.6 % (ref 11.5–15.5)
WBC: 5.3 10*3/uL (ref 4.0–10.5)

## 2014-01-16 LAB — LIPID PANEL
CHOL/HDL RATIO: 4 ratio
Cholesterol: 162 mg/dL (ref 0–200)
HDL: 41 mg/dL (ref >39)
LDL CALC: 102 mg/dL — AB (ref 0–99)
Triglycerides: 94 mg/dL (ref <150)
VLDL: 19 mg/dL (ref 0–40)

## 2014-01-16 LAB — RPR

## 2014-01-17 LAB — HIV-1 RNA QUANT-NO REFLEX-BLD
HIV 1 RNA Quant: 58 copies/mL — ABNORMAL HIGH (ref <20)
HIV-1 RNA QUANT, LOG: 1.76 {Log} — AB (ref <1.30)

## 2014-01-17 LAB — URINE CYTOLOGY ANCILLARY ONLY
Chlamydia: NEGATIVE
Neisseria Gonorrhea: NEGATIVE

## 2014-01-17 LAB — T-HELPER CELL (CD4) - (RCID CLINIC ONLY)
CD4 % Helper T Cell: 34 % (ref 33–55)
CD4 T Cell Abs: 990 /uL (ref 400–2700)

## 2014-02-08 ENCOUNTER — Ambulatory Visit: Payer: Self-pay | Admitting: Infectious Disease

## 2014-02-28 ENCOUNTER — Ambulatory Visit (INDEPENDENT_AMBULATORY_CARE_PROVIDER_SITE_OTHER): Payer: Self-pay | Admitting: Infectious Disease

## 2014-02-28 ENCOUNTER — Encounter: Payer: Self-pay | Admitting: Infectious Disease

## 2014-02-28 VITALS — BP 152/98 | HR 85 | Temp 98.1°F | Ht 66.0 in | Wt 177.0 lb

## 2014-02-28 DIAGNOSIS — F339 Major depressive disorder, recurrent, unspecified: Secondary | ICD-10-CM

## 2014-02-28 DIAGNOSIS — F329 Major depressive disorder, single episode, unspecified: Secondary | ICD-10-CM

## 2014-02-28 DIAGNOSIS — B20 Human immunodeficiency virus [HIV] disease: Secondary | ICD-10-CM

## 2014-02-28 DIAGNOSIS — F322 Major depressive disorder, single episode, severe without psychotic features: Secondary | ICD-10-CM

## 2014-02-28 DIAGNOSIS — K047 Periapical abscess without sinus: Secondary | ICD-10-CM

## 2014-02-28 MED ORDER — TRAZODONE HCL 50 MG PO TABS: 150.0000 mg | ORAL_TABLET | Freq: Every day | ORAL | Status: DC

## 2014-02-28 MED ORDER — AMOXICILLIN 500 MG PO TABS: 500.0000 mg | ORAL_TABLET | Freq: Three times a day (TID) | ORAL | Status: DC

## 2014-02-28 MED ORDER — EMTRICITAB-RILPIVIR-TENOFOV DF 200-25-300 MG PO TABS: 1.0000 | ORAL_TABLET | Freq: Every day | ORAL | Status: DC
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
## 2014-03-15 ENCOUNTER — Other Ambulatory Visit: Payer: Self-pay | Admitting: *Deleted

## 2014-03-15 ENCOUNTER — Telehealth: Payer: Self-pay | Admitting: *Deleted

## 2014-03-15 DIAGNOSIS — B379 Candidiasis, unspecified: Secondary | ICD-10-CM

## 2014-03-15 MED ORDER — FLUCONAZOLE 100 MG PO TABS: 100.0000 mg | ORAL_TABLET | Freq: Every day | ORAL | Status: DC

## 2014-03-15 NOTE — Telephone Encounter (Signed)
Patient called. She has developed a yeast infection while taking the oral antibiotic for her dental abscess.  Patient asking if she can have a prescription for diflucan.  Please advise. Andree CossHowell, Emmabelle Fear M, RN

## 2014-03-15 NOTE — Telephone Encounter (Signed)
Thank you!  Sent in the prescription

## 2014-03-15 NOTE — Telephone Encounter (Signed)
Ok can have fluconazole 100mg  for 10 days

## 2014-03-25 ENCOUNTER — Other Ambulatory Visit: Payer: Self-pay | Admitting: Infectious Disease

## 2014-05-04 ENCOUNTER — Telehealth: Payer: Self-pay | Admitting: *Deleted

## 2014-05-04 DIAGNOSIS — B379 Candidiasis, unspecified: Secondary | ICD-10-CM

## 2014-05-04 MED ORDER — FLUCONAZOLE 100 MG PO TABS: 150.0000 mg | ORAL_TABLET | Freq: Every day | ORAL | Status: DC

## 2014-05-04 NOTE — Addendum Note (Signed)
Addended by: Jennet MaduroESTRIDGE, Dermot Gremillion D on: 05/04/2014 04:05 PM   Modules accepted: Orders

## 2014-05-04 NOTE — Telephone Encounter (Signed)
Vaginal yeast infection x 2 days per pt, requesting treatment.  MD please advise.

## 2014-05-04 NOTE — Telephone Encounter (Signed)
Fluconazole 150mg  x once

## 2014-06-22 ENCOUNTER — Other Ambulatory Visit: Payer: Self-pay | Admitting: Infectious Disease

## 2014-06-29 ENCOUNTER — Other Ambulatory Visit: Payer: Self-pay

## 2014-07-10 ENCOUNTER — Ambulatory Visit: Payer: Self-pay | Admitting: Infectious Disease

## 2014-09-20 ENCOUNTER — Other Ambulatory Visit: Payer: Self-pay | Admitting: Infectious Disease

## 2014-11-13 ENCOUNTER — Other Ambulatory Visit: Payer: Self-pay | Admitting: Infectious Disease

## 2014-11-15 ENCOUNTER — Other Ambulatory Visit: Payer: Self-pay

## 2014-11-15 NOTE — Telephone Encounter (Signed)
Patient's prescription dose is trazodone, 3-50 mg tablets(150 mg total) at bedtime for sleep.  The current # of tablets per refill is 30.  The pt has run out of refills at this time.  MD please advise about # of tablets and # of refills.

## 2014-11-21 ENCOUNTER — Other Ambulatory Visit: Payer: Self-pay

## 2014-11-21 ENCOUNTER — Ambulatory Visit (INDEPENDENT_AMBULATORY_CARE_PROVIDER_SITE_OTHER): Payer: Self-pay | Admitting: Internal Medicine

## 2014-11-21 ENCOUNTER — Telehealth: Payer: Self-pay | Admitting: *Deleted

## 2014-11-21 VITALS — BP 147/100 | HR 85 | Temp 98.1°F | Wt 164.0 lb

## 2014-11-21 DIAGNOSIS — B20 Human immunodeficiency virus [HIV] disease: Secondary | ICD-10-CM

## 2014-11-21 DIAGNOSIS — J069 Acute upper respiratory infection, unspecified: Secondary | ICD-10-CM

## 2014-11-21 DIAGNOSIS — R21 Rash and other nonspecific skin eruption: Secondary | ICD-10-CM

## 2014-11-21 LAB — COMPLETE METABOLIC PANEL WITH GFR
ALBUMIN: 4.4 g/dL (ref 3.6–5.1)
ALK PHOS: 76 U/L (ref 33–115)
ALT: 10 U/L (ref 6–29)
AST: 16 U/L (ref 10–35)
BILIRUBIN TOTAL: 0.2 mg/dL (ref 0.2–1.2)
BUN: 10 mg/dL (ref 7–25)
CO2: 27 mmol/L (ref 20–31)
CREATININE: 0.91 mg/dL (ref 0.50–1.10)
Calcium: 8.9 mg/dL (ref 8.6–10.2)
Chloride: 104 mmol/L (ref 98–110)
GFR, EST AFRICAN AMERICAN: 86 mL/min (ref >=60)
GFR, Est Non African American: 74 mL/min (ref >=60)
Glucose, Bld: 89 mg/dL (ref 65–99)
Potassium: 3.9 mmol/L (ref 3.5–5.3)
Sodium: 140 mmol/L (ref 135–146)
TOTAL PROTEIN: 7.7 g/dL (ref 6.1–8.1)

## 2014-11-21 LAB — CBC WITH DIFFERENTIAL/PLATELET
BASOS ABS: 0.1 10*3/uL (ref 0.0–0.1)
Basophils Relative: 1 % (ref 0–1)
Eosinophils Absolute: 0.3 10*3/uL (ref 0.0–0.7)
Eosinophils Relative: 5 % (ref 0–5)
HEMATOCRIT: 38.9 % (ref 36.0–46.0)
Hemoglobin: 13.1 g/dL (ref 12.0–15.0)
LYMPHS ABS: 2.1 10*3/uL (ref 0.7–4.0)
LYMPHS PCT: 37 % (ref 12–46)
MCH: 29.2 pg (ref 26.0–34.0)
MCHC: 33.7 g/dL (ref 30.0–36.0)
MCV: 86.8 fL (ref 78.0–100.0)
MONOS PCT: 8 % (ref 3–12)
MPV: 8.6 fL (ref 8.6–12.4)
Monocytes Absolute: 0.5 10*3/uL (ref 0.1–1.0)
NEUTROS PCT: 49 % (ref 43–77)
Neutro Abs: 2.8 10*3/uL (ref 1.7–7.7)
Platelets: 299 10*3/uL (ref 150–400)
RBC: 4.48 MIL/uL (ref 3.87–5.11)
RDW: 14.4 % (ref 11.5–15.5)
WBC: 5.8 10*3/uL (ref 4.0–10.5)

## 2014-11-21 LAB — LIPID PANEL
CHOL/HDL RATIO: 4.4 ratio (ref <=5.0)
CHOLESTEROL: 208 mg/dL — AB (ref 125–200)
HDL: 47 mg/dL (ref >=46)
LDL Cholesterol: 135 mg/dL — ABNORMAL HIGH (ref <130)
TRIGLYCERIDES: 129 mg/dL (ref <150)
VLDL: 26 mg/dL (ref <30)

## 2014-11-21 NOTE — Progress Notes (Signed)
Patient ID: Julia Molina, female   DOB: March 22, 1965, 49 y.o.   MRN: [id number]       Patient ID: Julia Molina, female   DOB: 05/16/1965, 49 y.o.   MRN: [id number]  HPI  3 days of cough, nasal congestion and one fever. She has been in contact with a sick infant that may have been her risk of exposure. She is just concerned that she may be getting sick that could effect her hiv  Outpatient Encounter Prescriptions as of 11/21/2014  Medication Sig  . amoxicillin (AMOXIL) 500 MG tablet Take 1 tablet (500 mg total) by mouth 3 (three) times daily.  . Emtricitab-Rilpivir-Tenofovir 200-25-300 MG TABS Take 1 tablet by mouth daily.  . fluconazole (DIFLUCAN) 100 MG tablet Take 1.5 tablets (150 mg total) by mouth daily.  . pravastatin (PRAVACHOL) 20 MG tablet TAKE 1 TABLET BY MOUTH DAILY  . traZODone (DESYREL) 50 MG tablet Take 3 tablets (150 mg total) by mouth at bedtime.  . triamcinolone ointment (KENALOG) 0.5 % Apply topically 2 (two) times daily.   No facility-administered encounter medications on file as of 11/21/2014.     Patient Active Problem List   Diagnosis Date Noted  . AIDS (HCC) 02/28/2014  . Major depression, chronic (HCC) 02/28/2014  . Acute bronchitis 11/22/2012  . Vaginitis and vulvovaginitis 11/22/2012  . Dental abscess 09/08/2012  . Unspecified vitamin D deficiency 03/25/2012  . Boils 03/24/2012  . Hyperlipidemia 01/07/2012  . Smoker 07/02/2011  . Amenorrhea 03/05/2011  . 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/05/2011  . ELEVATED BLOOD PRESSURE 10/06/2008  . SKIN RASH 05/05/2007  . ANEMIA-IRON DEFICIENCY 11/27/2006  . Human immunodeficiency virus (HIV) disease (HCC) 11/02/2006     Health Maintenance Due  Topic Date Due  . INFLUENZA VACCINE  09/04/2014     Review of Systems postiive pertinents listed in hpi. 10 point ros is negative Physical Exam   LMP 06/14/2012 BP 147/100 mmHg  Pulse 85  Temp(Src) 98.1 F (36.7 C) (Oral)  Wt 164 lb (74.39 kg)  SpO2 99%  LMP  06/14/2012 Physical Exam  Constitutional:  oriented to person, place, and time. appears well-developed and well-nourished. No distress.  HENT: [place]/AT, PERRLA, no scleral icterus Mouth/Throat: Oropharynx is clear and moist. No oropharyngeal exudate.  Cardiovascular: Normal rate, regular rhythm and normal heart sounds. Exam reveals no gallop and no friction rub.  No murmur heard.  Pulmonary/Chest: Effort normal and breath sounds normal. No respiratory distress.  has no wheezes.  Neck = supple, no nuchal rigidity Abdominal: Soft. Bowel sounds are normal.  exhibits no distension. There is no tenderness.  Lymphadenopathy: no cervical adenopathy. No axillary adenopathy Neurological: alert and oriented to person, place, and time.  Skin: Skin is warm and dry. No rash noted. No erythema.  Psychiatric: a normal mood and affect.  behavior is normal.   Lab Results  Component Value Date   CD4TCELL 34 01/16/2014   Lab Results  Component Value Date   CD4TABS 990 01/16/2014   CD4TABS 970 05/11/2013   CD4TABS 680 03/21/2013   Lab Results  Component Value Date   HIV1RNAQUANT 58* 01/16/2014   Lab Results  Component Value Date   HEPBSAB NEG 11/05/2006   No results found for: RPR  CBC Lab Results  Component Value Date   WBC 5.3 01/16/2014   RBC 4.83 01/16/2014   HGB 14.0 01/16/2014   HCT 41.1 01/16/2014   PLT 376 01/16/2014   MCV 85.1 01/16/2014   MCH 29.0 01/16/2014  MCHC 34.1 01/16/2014   RDW 14.6 01/16/2014   LYMPHSABS 2.9 01/16/2014   MONOABS 0.3 01/16/2014   EOSABS 0.2 01/16/2014   BASOSABS 0.0 01/16/2014   BMET Lab Results  Component Value Date   NA 138 01/16/2014   K 4.4 01/16/2014   CL 105 01/16/2014   CO2 23 01/16/2014   GLUCOSE 100* 01/16/2014   BUN 12 01/16/2014   CREATININE 0.96 01/16/2014   CALCIUM 10.0 01/16/2014   GFRNONAA 70 01/16/2014   GFRAA 81 01/16/2014     Assessment and Plan  Uri = treat symptomatically. No abtx at this time. Will add cough  suppressant if she is having difficulty sleeping at night  hiv disease =continue with complera. She reports doing her labs which may show her cd 4 count being lower in setting of infection  Health maintenance =will defer flu shot until her next visit in 3-4 wk

## 2014-11-21 NOTE — Telephone Encounter (Signed)
Patient called, asking if she can be seen by a physician today. She was around her sick infant nephew this weekend, and on Sunday developed a productive cough with yellow phlegm, and states her "chest feels heavy", and now has a fever of 102.  She has been taking Alka-seltzer Cold to manage the symptoms, but it not helping.  Patient is concerned mostly because of her fever.  She was given an appointment today with clinic physician, Dr. Drue SecondSnider. Andree CossHowell, Ani Deoliveira M, RN

## 2014-11-22 LAB — HEPATITIS C ANTIBODY: HCV Ab: NEGATIVE

## 2014-11-22 LAB — RPR

## 2014-11-23 LAB — T-HELPER CELL (CD4) - (RCID CLINIC ONLY)
CD4 % Helper T Cell: 41 % (ref 33–55)
CD4 T CELL ABS: 870 /uL (ref 400–2700)

## 2014-11-23 LAB — URINE CYTOLOGY ANCILLARY ONLY
CHLAMYDIA, DNA PROBE: NEGATIVE
NEISSERIA GONORRHEA: NEGATIVE

## 2014-11-23 LAB — HIV-1 RNA QUANT-NO REFLEX-BLD: HIV-1 RNA Quant, Log: <1.3 Log copies/mL (ref <1.30)

## 2014-12-12 ENCOUNTER — Other Ambulatory Visit: Payer: Self-pay | Admitting: Infectious Disease

## 2014-12-18 ENCOUNTER — Ambulatory Visit: Payer: Self-pay | Admitting: Infectious Disease

## 2015-01-10 ENCOUNTER — Ambulatory Visit: Payer: Self-pay | Admitting: Infectious Disease

## 2015-01-11 ENCOUNTER — Other Ambulatory Visit: Payer: Self-pay | Admitting: Infectious Disease

## 2015-02-07 ENCOUNTER — Ambulatory Visit: Payer: Self-pay | Admitting: Infectious Disease

## 2015-02-12 ENCOUNTER — Other Ambulatory Visit: Payer: Self-pay | Admitting: Infectious Disease

## 2015-02-26 ENCOUNTER — Ambulatory Visit: Payer: Self-pay | Admitting: Infectious Disease

## 2015-02-26 ENCOUNTER — Telehealth: Payer: Self-pay | Admitting: *Deleted

## 2015-02-26 NOTE — Telephone Encounter (Signed)
Patient missed today's visit (due to weather per patient). She will have to take a day off again for a visit.  She will call 1/24 to reschedule today's appointment. Andree Coss, RN

## 2015-02-27 ENCOUNTER — Other Ambulatory Visit: Payer: Self-pay | Admitting: Infectious Disease

## 2015-02-27 DIAGNOSIS — E785 Hyperlipidemia, unspecified: Secondary | ICD-10-CM

## 2015-03-13 ENCOUNTER — Other Ambulatory Visit: Payer: Self-pay | Admitting: Internal Medicine

## 2015-03-13 ENCOUNTER — Other Ambulatory Visit: Payer: Self-pay | Admitting: Infectious Disease

## 2015-04-11 ENCOUNTER — Other Ambulatory Visit: Payer: Self-pay

## 2015-04-16 NOTE — Addendum Note (Signed)
Addended by: Lurlean LeydenPOOLE, TRAVIS F on: 04/16/2015 09:53 AM   Modules accepted: Orders

## 2015-04-25 ENCOUNTER — Ambulatory Visit: Payer: Self-pay | Admitting: Infectious Disease

## 2015-05-07 ENCOUNTER — Other Ambulatory Visit: Payer: Self-pay

## 2015-05-22 ENCOUNTER — Ambulatory Visit: Payer: Self-pay | Admitting: Infectious Disease

## 2015-07-11 ENCOUNTER — Other Ambulatory Visit: Payer: Self-pay | Admitting: Internal Medicine

## 2015-08-03 ENCOUNTER — Other Ambulatory Visit: Payer: Self-pay | Admitting: Internal Medicine

## 2015-08-28 ENCOUNTER — Other Ambulatory Visit: Payer: Self-pay | Admitting: Infectious Disease

## 2015-08-28 DIAGNOSIS — E785 Hyperlipidemia, unspecified: Secondary | ICD-10-CM

## 2015-09-19 ENCOUNTER — Other Ambulatory Visit: Payer: Self-pay

## 2015-09-19 DIAGNOSIS — Z79899 Other long term (current) drug therapy: Secondary | ICD-10-CM

## 2015-09-19 DIAGNOSIS — B20 Human immunodeficiency virus [HIV] disease: Secondary | ICD-10-CM

## 2015-09-19 LAB — COMPREHENSIVE METABOLIC PANEL
ALBUMIN: 4.7 g/dL (ref 3.6–5.1)
ALT: 11 U/L (ref 6–29)
AST: 16 U/L (ref 10–35)
Alkaline Phosphatase: 71 U/L (ref 33–115)
BILIRUBIN TOTAL: 0.2 mg/dL (ref 0.2–1.2)
BUN: 23 mg/dL (ref 7–25)
CO2: 19 mmol/L — ABNORMAL LOW (ref 20–31)
CREATININE: 0.93 mg/dL (ref 0.50–1.10)
Calcium: 9.5 mg/dL (ref 8.6–10.2)
Chloride: 106 mmol/L (ref 98–110)
Glucose, Bld: 97 mg/dL (ref 65–99)
Potassium: 4.1 mmol/L (ref 3.5–5.3)
SODIUM: 137 mmol/L (ref 135–146)
TOTAL PROTEIN: 7.8 g/dL (ref 6.1–8.1)

## 2015-09-19 LAB — CBC
HCT: 39.6 % (ref 35.0–45.0)
HEMOGLOBIN: 13.5 g/dL (ref 11.7–15.5)
MCH: 29.3 pg (ref 27.0–33.0)
MCHC: 34.1 g/dL (ref 32.0–36.0)
MCV: 86.1 fL (ref 80.0–100.0)
MPV: 8.8 fL (ref 7.5–12.5)
Platelets: 307 10*3/uL (ref 140–400)
RBC: 4.6 MIL/uL (ref 3.80–5.10)
RDW: 14.6 % (ref 11.0–15.0)
WBC: 5.6 10*3/uL (ref 3.8–10.8)

## 2015-09-19 LAB — LIPID PANEL
CHOLESTEROL: 189 mg/dL (ref 125–200)
HDL: 53 mg/dL (ref >=46)
LDL CALC: 109 mg/dL (ref <130)
TRIGLYCERIDES: 137 mg/dL (ref <150)
Total CHOL/HDL Ratio: 3.6 Ratio (ref <=5.0)
VLDL: 27 mg/dL (ref <30)

## 2015-09-20 LAB — T-HELPER CELL (CD4) - (RCID CLINIC ONLY)
CD4 % Helper T Cell: 34 % (ref 33–55)
CD4 T Cell Abs: 1040 /uL (ref 400–2700)

## 2015-09-21 LAB — HIV-1 RNA QUANT-NO REFLEX-BLD: HIV 1 RNA Quant: <20 copies/mL (ref <20)

## 2015-09-24 ENCOUNTER — Other Ambulatory Visit: Payer: Self-pay | Admitting: Internal Medicine

## 2015-09-24 DIAGNOSIS — B20 Human immunodeficiency virus [HIV] disease: Secondary | ICD-10-CM

## 2015-10-03 ENCOUNTER — Ambulatory Visit (INDEPENDENT_AMBULATORY_CARE_PROVIDER_SITE_OTHER): Payer: Self-pay | Admitting: Infectious Disease

## 2015-10-03 ENCOUNTER — Encounter: Payer: Self-pay | Admitting: Infectious Disease

## 2015-10-03 VITALS — BP 171/98 | HR 82 | Temp 98.0°F | Ht 66.0 in | Wt 162.0 lb

## 2015-10-03 DIAGNOSIS — B20 Human immunodeficiency virus [HIV] disease: Secondary | ICD-10-CM

## 2015-10-03 DIAGNOSIS — Z23 Encounter for immunization: Secondary | ICD-10-CM

## 2015-10-03 DIAGNOSIS — F1021 Alcohol dependence, in remission: Secondary | ICD-10-CM

## 2015-10-03 DIAGNOSIS — R030 Elevated blood-pressure reading, without diagnosis of hypertension: Secondary | ICD-10-CM

## 2015-10-03 DIAGNOSIS — F329 Major depressive disorder, single episode, unspecified: Secondary | ICD-10-CM

## 2015-10-03 DIAGNOSIS — I10 Essential (primary) hypertension: Secondary | ICD-10-CM

## 2015-10-03 HISTORY — DX: Essential (primary) hypertension: I10

## 2015-10-03 MED ORDER — HYDROCHLOROTHIAZIDE 25 MG PO TABS: 25.0000 mg | ORAL_TABLET | Freq: Every day | ORAL | 11 refills | Status: DC

## 2015-10-03 MED ORDER — EMTRICITAB-RILPIVIR-TENOFOV AF 200-25-25 MG PO TABS: 1.0000 | ORAL_TABLET | Freq: Every day | ORAL | 11 refills | Status: DC

## 2015-10-03 NOTE — Progress Notes (Signed)
Subjective:    Chief complaint: shoulder pain and follouwup for HIV   Patient ID: Julia Molina, female    DOB: 10-09-1965, 50 y.o.   MRN: [id number]  HPI  49y.o. female who is doing superbly well on her antiviral regimen, COMPLERA having been changed from [place] with undetectable viral load and health cd4 count.   Her BP is not ideally controlled still. She is c/o shoulder pain but otherwise in very good spirits and visiting with her daughter and grand-daughter.  Lab Results  Component Value Date   HIV1RNAQUANT <20 09/19/2015   HIV1RNAQUANT <20 11/21/2014   HIV1RNAQUANT 58 (H) 01/16/2014   Lab Results  Component Value Date   CD4TABS 1,040 09/19/2015   CD4TABS 870 11/21/2014   CD4TABS 990 01/16/2014    Past Medical History:  Diagnosis Date  . HIV (human immunodeficiency virus infection)     Past Surgical History:  Procedure Laterality Date  . CESAREAN SECTION      No family history on file.    Social History   Social History  . Marital status: Single    Spouse name: N/A  . Number of children: N/A  . Years of education: N/A   Social History Main Topics  . Smoking status: Current Every Day Smoker    Packs/day: 0.20    Types: Cigarettes  . Smokeless tobacco: Never Used     Comment: cutting back, using e-cig more  . Alcohol use No  . Drug use: No  . Sexual activity: Not Currently    Partners: Female   Other Topics Concern  . Not on file   Social History Narrative  . No narrative on file    No Known Allergies   Current Outpatient Prescriptions:  .  fluconazole (DIFLUCAN) 100 MG tablet, Take 1.5 tablets (150 mg total) by mouth daily., Disp: 1 tablet, Rfl: 0 .  pravastatin (PRAVACHOL) 20 MG tablet, TAKE 1 TABLET BY MOUTH EVERY DAY, Disp: 30 tablet, Rfl: 1 .  traZODone (DESYREL) 50 MG tablet, TAKE 3 TABLETS BY MOUTH EVERY NIGHT AT BEDTIME, Disp: 90 tablet, Rfl: 0 .  triamcinolone ointment (KENALOG) 0.5 %, Apply topically 2 (two) times daily., Disp:  30 g, Rfl: 0 .  emtricitabine-rilpivir-tenofovir AF (ODEFSEY) 200-25-25 MG TABS tablet, Take 1 tablet by mouth daily with breakfast., Disp: 30 tablet, Rfl: 11 .  hydrochlorothiazide (HYDRODIURIL) 25 MG tablet, Take 1 tablet (25 mg total) by mouth daily., Disp: 30 tablet, Rfl: 11   Review of Systems  Constitutional: Negative for activity change, appetite change and unexpected weight change.  HENT: Negative for dental problem, rhinorrhea, sneezing and trouble swallowing.   Eyes: Negative for photophobia and visual disturbance.  Respiratory: Negative for chest tightness, shortness of breath, wheezing and stridor.   Cardiovascular: Negative for palpitations and leg swelling.  Gastrointestinal: Negative for abdominal distention, anal bleeding, blood in stool, constipation and diarrhea.  Genitourinary: Negative for difficulty urinating, dysuria, flank pain and hematuria.  Musculoskeletal: Positive for arthralgias. Negative for back pain and gait problem.  Skin: Negative for color change, pallor and wound.  Neurological: Negative for dizziness, tremors and light-headedness.  Hematological: Negative for adenopathy. Does not bruise/bleed easily.  Psychiatric/Behavioral: Negative for agitation, behavioral problems, confusion, decreased concentration, dysphoric mood and sleep disturbance. The patient is not nervous/anxious.        Objective:   Physical Exam  Constitutional: She is oriented to person, place, and time. She appears well-developed and well-nourished. No distress.  HENT:  Head: Normocephalic and atraumatic.  Mouth/Throat: Oropharynx is clear and moist. No oropharyngeal exudate.  Eyes: Conjunctivae and EOM are normal. Pupils are equal, round, and reactive to light. No scleral icterus.  Neck: Normal range of motion. Neck supple. No JVD present.  Cardiovascular: Normal rate, regular rhythm and normal heart sounds.  Exam reveals no gallop and no friction rub.   No murmur  heard. Pulmonary/Chest: Effort normal. No respiratory distress. She has no wheezes.  Abdominal: She exhibits no distension and no mass. There is no tenderness. There is no rebound and no guarding.  Musculoskeletal: She exhibits no edema.       Left shoulder: She exhibits tenderness.  Lymphadenopathy:    She has no cervical adenopathy.  Neurological: She is alert and oriented to person, place, and time. She exhibits normal muscle tone. Coordination normal.  Skin: Skin is warm and dry. She is not diaphoretic. No erythema. No pallor.  Psychiatric: She has a normal mood and affect. Her behavior is normal. Judgment and thought content normal.          Assessment & Plan:  HIV Disease/AIDS : perfect suppression, switch to ODEFSEY for better bone and kindey safety    Major depression, recurrent: on trazodone may be helpful.   HTN: start HCTZ 25 mg  Shoulder pain: will need orange card for evaluation for this by expertt if it persits   I spent greater than 25 minutes with the patient including greater than 50% of time in face to face counsel of the patient re her HIV, her HTN, shoulder pain, depression and in coordination of her care.

## 2015-10-24 ENCOUNTER — Telehealth: Payer: Self-pay | Admitting: *Deleted

## 2015-10-24 NOTE — Telephone Encounter (Addendum)
Shoulder pain continues, has not gotten "orange" card yet.  Has been using OTC ibuprofen with slight relief.  Unable to sleep at night.  Pt receives ADAP.  RN suggested trying heat or cold applications when she gets off work.  She has not tried this yet.  RN suggested speaking with the [facility] pharmacist to come up with a plan that works with her HIV medications.  The patient stated that she would speak with the pharmacist.  Phone call transferred to pharmacist.

## 2015-10-26 ENCOUNTER — Ambulatory Visit: Payer: Self-pay

## 2015-10-29 ENCOUNTER — Other Ambulatory Visit: Payer: Self-pay | Admitting: Internal Medicine

## 2015-10-29 DIAGNOSIS — E785 Hyperlipidemia, unspecified: Secondary | ICD-10-CM

## 2015-10-30 NOTE — Telephone Encounter (Signed)
She could try OTC naproxen INSTEAD of the ibuprofen.

## 2015-11-16 ENCOUNTER — Ambulatory Visit: Payer: Self-pay

## 2015-12-21 ENCOUNTER — Telehealth: Payer: Self-pay | Admitting: *Deleted

## 2015-12-21 ENCOUNTER — Ambulatory Visit: Payer: Self-pay

## 2015-12-21 NOTE — Telephone Encounter (Signed)
Left patient a message to call [facility] to schedule another PAP smear appt.

## 2016-02-11 ENCOUNTER — Other Ambulatory Visit: Payer: Self-pay | Admitting: Internal Medicine

## 2016-02-11 DIAGNOSIS — E785 Hyperlipidemia, unspecified: Secondary | ICD-10-CM

## 2016-02-14 ENCOUNTER — Encounter: Payer: Self-pay | Admitting: Infectious Disease

## 2016-03-06 ENCOUNTER — Other Ambulatory Visit: Payer: Self-pay

## 2016-03-20 ENCOUNTER — Ambulatory Visit: Payer: Self-pay | Admitting: Infectious Disease

## 2016-03-31 ENCOUNTER — Telehealth: Payer: Self-pay | Admitting: *Deleted

## 2016-03-31 ENCOUNTER — Other Ambulatory Visit: Payer: Self-pay | Admitting: *Deleted

## 2016-03-31 DIAGNOSIS — L0292 Furuncle, unspecified: Secondary | ICD-10-CM

## 2016-03-31 DIAGNOSIS — R030 Elevated blood-pressure reading, without diagnosis of hypertension: Secondary | ICD-10-CM

## 2016-03-31 DIAGNOSIS — R21 Rash and other nonspecific skin eruption: Secondary | ICD-10-CM

## 2016-03-31 MED ORDER — TRIAMCINOLONE ACETONIDE 0.5 % EX OINT
TOPICAL_OINTMENT | Freq: Two times a day (BID) | CUTANEOUS | 0 refills | Status: DC
Start: 2016-03-31 — End: 2016-04-07

## 2016-03-31 NOTE — Telephone Encounter (Signed)
Patient called for a refill of the kenalog ointment. Stated she has a small rash on her arm. [facility] [place] and advised patient that she needs a follow up appt. She said as soon as she can arrange transportation she will call to schedule.

## 2016-04-02 ENCOUNTER — Ambulatory Visit: Payer: Self-pay | Admitting: Infectious Disease

## 2016-04-07 ENCOUNTER — Other Ambulatory Visit: Payer: Self-pay

## 2016-04-07 ENCOUNTER — Other Ambulatory Visit: Payer: Self-pay | Admitting: *Deleted

## 2016-04-07 ENCOUNTER — Telehealth: Payer: Self-pay

## 2016-04-07 ENCOUNTER — Telehealth: Payer: Self-pay | Admitting: *Deleted

## 2016-04-07 DIAGNOSIS — R21 Rash and other nonspecific skin eruption: Secondary | ICD-10-CM

## 2016-04-07 DIAGNOSIS — L0292 Furuncle, unspecified: Secondary | ICD-10-CM

## 2016-04-07 DIAGNOSIS — R030 Elevated blood-pressure reading, without diagnosis of hypertension: Secondary | ICD-10-CM

## 2016-04-07 MED ORDER — TRIAMCINOLONE ACETONIDE 0.5 % EX OINT: TOPICAL_OINTMENT | Freq: Two times a day (BID) | CUTANEOUS | 1 refills | Status: DC

## 2016-04-07 MED ORDER — TRIAMCINOLONE ACETONIDE 0.5 % EX OINT
TOPICAL_OINTMENT | Freq: Two times a day (BID) | CUTANEOUS | 0 refills | Status: DC
Start: 2016-04-07 — End: 2016-04-07

## 2016-04-07 NOTE — Telephone Encounter (Signed)
Patient calling for refill of triamcinolone creme.  She is aware it is not covered through ADAP and will pay out of pocket.   Medication sent to pharmacy.

## 2016-04-07 NOTE — Telephone Encounter (Signed)
Patient [facility], as she states [facility] is too [facility] [place]. This is covered by ADAP. RN returned call, left message advising patient to call [facility] in [place] and ask them to transfer the medication there for ADAP to cover it. Andree CossHowell, Michelle M, RN

## 2016-06-16 ENCOUNTER — Other Ambulatory Visit: Payer: Self-pay | Admitting: *Deleted

## 2016-06-16 DIAGNOSIS — E785 Hyperlipidemia, unspecified: Secondary | ICD-10-CM

## 2016-06-16 MED ORDER — PRAVASTATIN SODIUM 20 MG PO TABS: 20.0000 mg | ORAL_TABLET | Freq: Every day | ORAL | 1 refills | Status: DC

## 2016-06-19 ENCOUNTER — Other Ambulatory Visit: Payer: Self-pay | Admitting: *Deleted

## 2016-06-19 DIAGNOSIS — E785 Hyperlipidemia, unspecified: Secondary | ICD-10-CM

## 2016-06-19 MED ORDER — PRAVASTATIN SODIUM 20 MG PO TABS: 20.0000 mg | ORAL_TABLET | Freq: Every day | ORAL | 1 refills | Status: DC

## 2016-07-01 ENCOUNTER — Telehealth: Payer: Self-pay | Admitting: *Deleted

## 2016-07-01 NOTE — Telephone Encounter (Signed)
Patient called stating that she wants to start taking a women's multi vitamin and wanted to be sure it did not interact with her hiv meds. Verified with pharmacy it is ok to take a multi vitamin with Odefsey. Advised patient if it has iron to increase her water intake and fiber to avoid constipation. Note routed to MD as FYI.

## 2016-07-01 NOTE — Telephone Encounter (Signed)
Thanks so much. 

## 2016-07-21 ENCOUNTER — Other Ambulatory Visit: Payer: Self-pay

## 2016-07-21 DIAGNOSIS — R030 Elevated blood-pressure reading, without diagnosis of hypertension: Secondary | ICD-10-CM

## 2016-07-21 LAB — CBC WITH DIFFERENTIAL/PLATELET
Basophils Absolute: 0 cells/uL (ref 0–200)
Basophils Relative: 0 %
Eosinophils Absolute: 59 cells/uL (ref 15–500)
Eosinophils Relative: 1 %
HEMATOCRIT: 35.7 % (ref 35.0–45.0)
Hemoglobin: 12 g/dL (ref 11.7–15.5)
Lymphocytes Relative: 43 %
Lymphs Abs: 2537 cells/uL (ref 850–3900)
MCH: 29.1 pg (ref 27.0–33.0)
MCHC: 33.6 g/dL (ref 32.0–36.0)
MCV: 86.4 fL (ref 80.0–100.0)
MONO ABS: 354 {cells}/uL (ref 200–950)
MONOS PCT: 6 %
MPV: 8.5 fL (ref 7.5–12.5)
NEUTROS PCT: 50 %
Neutro Abs: 2950 cells/uL (ref 1500–7800)
PLATELETS: 286 10*3/uL (ref 140–400)
RBC: 4.13 MIL/uL (ref 3.80–5.10)
RDW: 14.9 % (ref 11.0–15.0)
WBC: 5.9 10*3/uL (ref 3.8–10.8)

## 2016-07-22 LAB — COMPLETE METABOLIC PANEL WITH GFR
ALBUMIN: 4.5 g/dL (ref 3.6–5.1)
ALK PHOS: 69 U/L (ref 33–130)
ALT: 12 U/L (ref 6–29)
AST: 15 U/L (ref 10–35)
BILIRUBIN TOTAL: 0.2 mg/dL (ref 0.2–1.2)
BUN: 21 mg/dL (ref 7–25)
CO2: 19 mmol/L — ABNORMAL LOW (ref 20–31)
Calcium: 9.4 mg/dL (ref 8.6–10.4)
Chloride: 105 mmol/L (ref 98–110)
Creat: 0.9 mg/dL (ref 0.50–1.05)
GFR, EST AFRICAN AMERICAN: 86 mL/min (ref >=60)
GFR, EST NON AFRICAN AMERICAN: 75 mL/min (ref >=60)
Glucose, Bld: 97 mg/dL (ref 65–99)
POTASSIUM: 3.8 mmol/L (ref 3.5–5.3)
Sodium: 139 mmol/L (ref 135–146)
TOTAL PROTEIN: 7.5 g/dL (ref 6.1–8.1)

## 2016-07-22 LAB — LIPID PANEL
CHOLESTEROL: 186 mg/dL (ref <200)
HDL: 52 mg/dL (ref >50)
LDL CALC: 106 mg/dL — AB (ref <100)
Total CHOL/HDL Ratio: 3.6 Ratio (ref <5.0)
Triglycerides: 141 mg/dL (ref <150)
VLDL: 28 mg/dL (ref <30)

## 2016-07-22 LAB — RPR

## 2016-07-22 LAB — T-HELPER CELL (CD4) - (RCID CLINIC ONLY)
CD4 % Helper T Cell: 33 % (ref 33–55)
CD4 T Cell Abs: 860 /uL (ref 400–2700)

## 2016-07-24 LAB — HIV-1 RNA QUANT-NO REFLEX-BLD
HIV 1 RNA QUANT: NOT DETECTED {copies}/mL
HIV-1 RNA Quant, Log: <1.3 Log copies/mL

## 2016-08-05 ENCOUNTER — Ambulatory Visit: Payer: Self-pay

## 2016-08-05 ENCOUNTER — Ambulatory Visit (INDEPENDENT_AMBULATORY_CARE_PROVIDER_SITE_OTHER): Payer: Self-pay | Admitting: Infectious Disease

## 2016-08-05 ENCOUNTER — Encounter: Payer: Self-pay | Admitting: Infectious Disease

## 2016-08-05 ENCOUNTER — Other Ambulatory Visit: Payer: Self-pay | Admitting: Infectious Disease

## 2016-08-05 VITALS — BP 143/80 | HR 81 | Temp 98.1°F | Ht 66.0 in | Wt 171.0 lb

## 2016-08-05 DIAGNOSIS — R030 Elevated blood-pressure reading, without diagnosis of hypertension: Secondary | ICD-10-CM

## 2016-08-05 DIAGNOSIS — E785 Hyperlipidemia, unspecified: Secondary | ICD-10-CM

## 2016-08-05 DIAGNOSIS — I10 Essential (primary) hypertension: Secondary | ICD-10-CM

## 2016-08-05 DIAGNOSIS — M159 Polyosteoarthritis, unspecified: Secondary | ICD-10-CM

## 2016-08-05 DIAGNOSIS — M150 Primary generalized (osteo)arthritis: Secondary | ICD-10-CM

## 2016-08-05 DIAGNOSIS — B20 Human immunodeficiency virus [HIV] disease: Secondary | ICD-10-CM

## 2016-08-05 DIAGNOSIS — Z23 Encounter for immunization: Secondary | ICD-10-CM

## 2016-08-05 DIAGNOSIS — M1990 Unspecified osteoarthritis, unspecified site: Secondary | ICD-10-CM | POA: Insufficient documentation

## 2016-08-05 HISTORY — DX: Unspecified osteoarthritis, unspecified site: M19.90

## 2016-08-05 NOTE — Progress Notes (Signed)
Subjective:    Chief complaint: followup for HIV and c/o osteoarthritic pains   Patient ID: Julia Molina, female    DOB: [birth year], 51 y.o.   MRN: [id number]  HPI  50 \\y .o. female who is doing superbly well on her antiviral regimen ODEFESEY. She is getting HMAP renewed. She is working but asks if she can have special accomodations due to her sig pain in getting and out of transportation due to her OA.  Lab Results  Component Value Date   HIV1RNAQUANT <20 NOT DETECTED 07/21/2016   HIV1RNAQUANT <20 09/19/2015   HIV1RNAQUANT <20 11/21/2014   Lab Results  Component Value Date   CD4TABS 860 07/21/2016   CD4TABS 1,040 09/19/2015   CD4TABS 870 11/21/2014    Past Medical History:  Diagnosis Date  . Benign essential HTN 10/03/2015  . HIV (human immunodeficiency virus infection) (HCC)     Past Surgical History:  Procedure Laterality Date  . CESAREAN SECTION      No family history on file.    Social History   Social History  . Marital status: Single    Spouse name: N/A  . Number of children: N/A  . Years of education: N/A   Social History Main Topics  . Smoking status: Current Every Day Smoker    Packs/day: 0.20    Types: Cigarettes  . Smokeless tobacco: Never Used     Comment: cutting back, using e-cig more  . Alcohol use No  . Drug use: No  . Sexual activity: Not Currently    Partners: Female   Other Topics Concern  . None   Social History Narrative  . None    No Known Allergies   Current Outpatient Prescriptions:  .  emtricitabine-rilpivir-tenofovir AF (ODEFSEY) 200-25-25 MG TABS tablet, Take 1 tablet by mouth daily with breakfast., Disp: 30 tablet, Rfl: 11 .  fluconazole (DIFLUCAN) 100 MG tablet, Take 1.5 tablets (150 mg total) by mouth daily., Disp: 1 tablet, Rfl: 0 .  hydrochlorothiazide (HYDRODIURIL) 25 MG tablet, Take 1 tablet (25 mg total) by mouth daily., Disp: 30 tablet, Rfl: 11 .  pravastatin (PRAVACHOL) 20 MG tablet, Take 1 tablet (20 mg  total) by mouth daily., Disp: 30 tablet, Rfl: 1 .  traZODone (DESYREL) 50 MG tablet, TAKE 3 TABLETS BY MOUTH EVERY NIGHT AT BEDTIME, Disp: 90 tablet, Rfl: 0 .  triamcinolone ointment (KENALOG) 0.5 %, Apply topically 2 (two) times daily., Disp: 30 g, Rfl: 1   Review of Systems  Constitutional: Negative for activity change, appetite change and unexpected weight change.  HENT: Negative for dental problem, rhinorrhea, sneezing and trouble swallowing.   Eyes: Negative for photophobia and visual disturbance.  Respiratory: Negative for chest tightness, shortness of breath, wheezing and stridor.   Cardiovascular: Negative for palpitations and leg swelling.  Gastrointestinal: Negative for abdominal distention, anal bleeding, blood in stool, constipation and diarrhea.  Genitourinary: Negative for difficulty urinating, dysuria, flank pain and hematuria.  Musculoskeletal: Positive for arthralgias and back pain. Negative for gait problem.  Skin: Negative for color change, pallor and wound.  Neurological: Negative for dizziness, tremors and light-headedness.  Hematological: Negative for adenopathy. Does not bruise/bleed easily.  Psychiatric/Behavioral: Negative for agitation, behavioral problems, confusion, decreased concentration, dysphoric mood and sleep disturbance. The patient is not nervous/anxious.        Objective:   Physical Exam  Constitutional: She is oriented to person, place, and time. She appears well-developed and well-nourished. No distress.  HENT:  Head: Normocephalic and atraumatic.  Mouth/Throat: Oropharynx  is clear and moist. No oropharyngeal exudate.  Eyes: Conjunctivae and EOM are normal. Pupils are equal, round, and reactive to light. No scleral icterus.  Neck: Normal range of motion. Neck supple. No JVD present.  Cardiovascular: Normal rate and regular rhythm.   No murmur heard. Pulmonary/Chest: Effort normal. No respiratory distress. She has no wheezes.  Abdominal: Soft. She  exhibits no distension.  Musculoskeletal: She exhibits no edema.       Left shoulder: She exhibits tenderness.  Lymphadenopathy:    She has no cervical adenopathy.  Neurological: She is alert and oriented to person, place, and time. She exhibits normal muscle tone. Coordination normal.  Skin: Skin is warm and dry. She is not diaphoretic. No erythema. No pallor.  Psychiatric: She has a normal mood and affect. Her behavior is normal. Judgment and thought content normal.          Assessment & Plan:  HIV Disease/AIDS : perfect suppression, switch to ODEFSEY for better bone and kindey safety   HTN: cotninue  HCTZ 25 mg  Vitals:   08/05/16 1415  BP: (!) 143/80  Pulse: 81  Temp: 98.1 F (36.7 C)    Multiple joint pains including knees: she feels she has arthritis that is making it hard for her to get into and out of public transportation  I spent greater than 25 minutes with the patient including greater than 50% of time in face to face counsel of the patient re her HIV, her HTN,joints pains  and in coordination of her care.

## 2016-09-02 NOTE — Telephone Encounter (Signed)
No note in chart 

## 2016-09-12 ENCOUNTER — Encounter: Payer: Self-pay | Admitting: Infectious Disease

## 2017-01-10 ENCOUNTER — Encounter (HOSPITAL_BASED_OUTPATIENT_CLINIC_OR_DEPARTMENT_OTHER): Payer: Self-pay | Admitting: Emergency Medicine

## 2017-01-10 ENCOUNTER — Other Ambulatory Visit: Payer: Self-pay

## 2017-01-10 ENCOUNTER — Emergency Department (HOSPITAL_BASED_OUTPATIENT_CLINIC_OR_DEPARTMENT_OTHER)
Admission: EM | Admit: 2017-01-10 | Discharge: 2017-01-10 | Disposition: A | Discharge status: Home / Self Care | Payer: Self-pay | Attending: Physician Assistant | Admitting: Physician Assistant

## 2017-01-10 DIAGNOSIS — F17290 Nicotine dependence, other tobacco product, uncomplicated: Secondary | ICD-10-CM | POA: Insufficient documentation

## 2017-01-10 DIAGNOSIS — B20 Human immunodeficiency virus [HIV] disease: Secondary | ICD-10-CM | POA: Insufficient documentation

## 2017-01-10 DIAGNOSIS — Z7902 Long term (current) use of antithrombotics/antiplatelets: Secondary | ICD-10-CM | POA: Insufficient documentation

## 2017-01-10 DIAGNOSIS — Z79899 Other long term (current) drug therapy: Secondary | ICD-10-CM | POA: Insufficient documentation

## 2017-01-10 DIAGNOSIS — K5900 Constipation, unspecified: Secondary | ICD-10-CM | POA: Insufficient documentation

## 2017-01-10 DIAGNOSIS — K649 Unspecified hemorrhoids: Secondary | ICD-10-CM | POA: Insufficient documentation

## 2017-01-10 DIAGNOSIS — F17210 Nicotine dependence, cigarettes, uncomplicated: Secondary | ICD-10-CM | POA: Insufficient documentation

## 2017-01-10 HISTORY — DX: Pure hypercholesterolemia, unspecified: E78.00

## 2017-01-10 MED ORDER — POLYETHYLENE GLYCOL 3350 17 G PO PACK: 17.0000 g | PACK | Freq: Every day | ORAL | 0 refills | Status: DC

## 2017-01-10 MED ORDER — DOCUSATE SODIUM 100 MG PO CAPS: 100.0000 mg | ORAL_CAPSULE | Freq: Two times a day (BID) | ORAL | 0 refills | Status: DC

## 2017-01-10 NOTE — ED Triage Notes (Signed)
Abscess on L buttock

## 2017-01-10 NOTE — ED Provider Notes (Signed)
[facility] EMERGENCY DEPARTMENT Provider Note   CSN: [id number] Arrival date & time: 01/10/17  1307     History   Chief Complaint Chief Complaint  Patient presents with  . Abscess    HPI Julia Molina is a 51 y.o. female.  HPI   Julia Molina is a 51yo female with a history of HIV, hyperlipidemia, depression who presents to the emergency department for evaluation of bump near the anus.  Patient states that she noticed this about 3 days ago.  States that it feels mildly uncomfortable with sitting and having a bowel movement.  She has tried warm compresses with relief of discomfort.  She states that she has been more constipated lately, with some straining to have a stool.  She denies hematochezia, melena.  Denies fever, chest pain, shortness of breath, abdominal pain, nausea/vomiting diarrhea, dysuria, urinary frequency.  States that she has a history of hemorrhoids.  Past Medical History:  Diagnosis Date  . Benign essential HTN 10/03/2015  . High cholesterol   . HIV (human immunodeficiency virus infection) (HCC)   . Osteoarthritis 08/05/2016    Patient Active Problem List   Diagnosis Date Noted  . Osteoarthritis 08/05/2016  . Benign essential HTN 10/03/2015  . AIDS (HCC) 02/28/2014  . Major depression, chronic 02/28/2014  . Acute bronchitis 11/22/2012  . Vaginitis and vulvovaginitis 11/22/2012  . Dental abscess 09/08/2012  . Unspecified vitamin D deficiency 03/25/2012  . Boils 03/24/2012  . Hyperlipidemia 01/07/2012  . Smoker 07/02/2011  . Amenorrhea 03/05/2011  . 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/05/2011  . ELEVATED BLOOD PRESSURE 10/06/2008  . SKIN RASH 05/05/2007  . ANEMIA-IRON DEFICIENCY 11/27/2006  . Human immunodeficiency virus (HIV) disease (HCC) 11/02/2006    Past Surgical History:  Procedure Laterality Date  . CESAREAN SECTION      OB History    No data available       Home Medications    Prior to Admission medications   Medication Sig  Start Date End Date Taking? Authorizing Provider  fluconazole (DIFLUCAN) 100 MG tablet Take 1.5 tablets (150 mg total) by mouth daily. 05/04/14   Randall HissVan Dam, Cornelius N, MD  hydrochlorothiazide (HYDRODIURIL) 25 MG tablet TAKE 1 TABLET(25 MG) BY MOUTH DAILY 08/05/16   Daiva EvesVan Dam, Lisette Grinderornelius N, MD  ODEFSEY 200-25-25 MG TABS tablet TAKE 1 TABLET BY MOUTH DAILY WITH BREAKFAST 08/05/16   Daiva EvesVan Dam, Lisette Grinderornelius N, MD  pravastatin (PRAVACHOL) 20 MG tablet TAKE 1 TABLET BY MOUTH EVERY DAY 08/05/16   Daiva EvesVan Dam, Lisette Grinderornelius N, MD  traZODone (DESYREL) 50 MG tablet TAKE 3 TABLETS BY MOUTH EVERY NIGHT AT BEDTIME 03/13/15   Judyann MunsonSnider, Cynthia, MD  triamcinolone ointment (KENALOG) 0.5 % Apply topically 2 (two) times daily. 04/07/16   Randall HissVan Dam, Cornelius N, MD    Family History No family history on file.  Social History Social History   Tobacco Use  . Smoking status: Current Every Day Smoker    Packs/day: 0.20    Types: Cigarettes  . Smokeless tobacco: Never Used  . Tobacco comment: cutting back, using e-cig more  Substance Use Topics  . Alcohol use: No  . Drug use: No     Allergies   Patient has no known allergies.   Review of Systems Review of Systems  Constitutional: Negative for chills, fatigue and fever.  HENT: Negative for congestion.   Eyes: Negative for visual disturbance.  Respiratory: Negative for shortness of breath.   Cardiovascular: Negative for chest pain.  Gastrointestinal: Positive for  constipation. Negative for abdominal pain, diarrhea, nausea and vomiting.  Genitourinary: Negative for difficulty urinating.  Musculoskeletal: Negative for gait problem.  Skin: Negative for color change and wound.  Neurological: Negative for dizziness and light-headedness.  Psychiatric/Behavioral: Negative for agitation and behavioral problems.     Physical Exam Updated Vital Signs BP (!) 136/98 (BP Location: Left Arm)   Pulse 86   Temp 98.2 F (36.8 C) (Oral)   Resp 16   Ht 5\' 6"  (1.676 m)   Wt 73.5 kg (162  lb)   LMP 06/14/2012   SpO2 99%   BMI 26.15 kg/m   Physical Exam  Constitutional: She appears well-developed and well-nourished. No distress.  HENT:  Head: Normocephalic and atraumatic.  Eyes: Right eye exhibits no discharge. Left eye exhibits no discharge.  Pulmonary/Chest: Effort normal. No respiratory distress.  Genitourinary:  Genitourinary Comments: Chaperone present for exam. Single external hemorrhoid which is nontender, not thrombosed. No gross blood noted on rectal exam, normal tone, no tenderness, no mass or fissure.  Neurological: She is alert. Coordination normal.  Skin: She is not diaphoretic.  Psychiatric: She has a normal mood and affect. Her behavior is normal.  Nursing note and vitals reviewed.    ED Treatments / Results  Labs (all labs ordered are listed, but only abnormal results are displayed) Labs Reviewed - No data to display  EKG  EKG Interpretation None       Radiology No results found.  Procedures Procedures (including critical care time)  Medications Ordered in ED Medications - No data to display   Initial Impression / Assessment and Plan / ED Course  I have reviewed the triage vital signs and the nursing notes.  Pertinent labs & imaging results that were available during my care of the patient were reviewed by me and considered in my medical decision making (see chart for details).     Patient presents with a single external hemorrhoid.  It is not thrombosed.  Nontender to palpation.  Will discharge patient with stool softener. Counseled patient on return precautions and she agrees and voiced understanding to the plan.  Her vital signs are stable and she has no complaints prior to discharge.  Final Clinical Impressions(s) / ED Diagnoses   Final diagnoses:  Hemorrhoids, unspecified hemorrhoid type    ED Discharge Orders    None       Kellie ShropshireShrosbree, Emily J, PA-C 01/10/17 1837    Abelino DerrickMackuen, Courteney Lyn, MD 01/11/17 [phone]

## 2017-01-10 NOTE — ED Notes (Signed)
ED Provider at bedside. 

## 2017-01-10 NOTE — Discharge Instructions (Signed)
Please take stool softener (colace) and MiraLAX daily to help with your constipation.  Please keep your appointment with your primary care doctor for recheck of the hemorrhoid.  Please return to the ER if you have fever >100.51F, worsening pain or have any new or worsening symptoms.

## 2017-01-14 ENCOUNTER — Other Ambulatory Visit: Payer: Self-pay | Admitting: Infectious Disease

## 2017-01-14 DIAGNOSIS — E785 Hyperlipidemia, unspecified: Secondary | ICD-10-CM

## 2017-01-14 DIAGNOSIS — R030 Elevated blood-pressure reading, without diagnosis of hypertension: Secondary | ICD-10-CM

## 2017-01-22 ENCOUNTER — Other Ambulatory Visit: Payer: Self-pay

## 2017-02-05 ENCOUNTER — Ambulatory Visit: Payer: Self-pay | Admitting: Infectious Disease

## 2017-02-23 ENCOUNTER — Other Ambulatory Visit: Payer: Self-pay

## 2017-03-05 ENCOUNTER — Ambulatory Visit: Payer: Self-pay | Admitting: Infectious Disease

## 2017-03-11 ENCOUNTER — Other Ambulatory Visit: Payer: Self-pay

## 2017-03-11 NOTE — Addendum Note (Signed)
Addended by: ABBITT, KATRINA F on: 03/11/2017 08:34 AM ° ° Modules accepted: Orders ° °

## 2017-03-11 NOTE — Addendum Note (Signed)
Addended byJimmy Picket: Molina, Julia F on: 03/11/2017 08:34 AM   Modules accepted: Orders

## 2017-03-11 NOTE — Addendum Note (Signed)
Addended byDoristine Devoid: ABBITT, KATRINA F on: 03/11/2017 08:33 AM   Modules accepted: Orders

## 2017-03-25 ENCOUNTER — Ambulatory Visit: Payer: Self-pay | Admitting: Infectious Disease

## 2017-04-06 ENCOUNTER — Encounter: Payer: Self-pay | Admitting: Infectious Disease

## 2017-04-21 ENCOUNTER — Other Ambulatory Visit: Payer: Self-pay | Admitting: Infectious Disease

## 2017-04-21 DIAGNOSIS — R030 Elevated blood-pressure reading, without diagnosis of hypertension: Secondary | ICD-10-CM

## 2017-04-21 DIAGNOSIS — E785 Hyperlipidemia, unspecified: Secondary | ICD-10-CM

## 2017-04-23 ENCOUNTER — Other Ambulatory Visit: Payer: Self-pay | Admitting: Infectious Disease

## 2017-04-23 DIAGNOSIS — E785 Hyperlipidemia, unspecified: Secondary | ICD-10-CM

## 2017-04-23 DIAGNOSIS — R030 Elevated blood-pressure reading, without diagnosis of hypertension: Secondary | ICD-10-CM

## 2017-05-18 ENCOUNTER — Other Ambulatory Visit: Payer: Self-pay

## 2017-05-28 ENCOUNTER — Other Ambulatory Visit: Payer: Self-pay

## 2017-06-02 ENCOUNTER — Other Ambulatory Visit: Payer: Self-pay

## 2017-06-02 DIAGNOSIS — B20 Human immunodeficiency virus [HIV] disease: Secondary | ICD-10-CM

## 2017-06-03 LAB — COMPLETE METABOLIC PANEL WITH GFR
AG Ratio: 1.6 (calc) (ref 1.0–2.5)
ALBUMIN MSPROF: 4.7 g/dL (ref 3.6–5.1)
ALT: 10 U/L (ref 6–29)
AST: 18 U/L (ref 10–35)
Alkaline phosphatase (APISO): 68 U/L (ref 33–130)
BUN / CREAT RATIO: 15 (calc) (ref 6–22)
BUN: 17 mg/dL (ref 7–25)
CALCIUM: 9.3 mg/dL (ref 8.6–10.4)
CO2: 23 mmol/L (ref 20–32)
CREATININE: 1.12 mg/dL — AB (ref 0.50–1.05)
Chloride: 104 mmol/L (ref 98–110)
GFR, EST AFRICAN AMERICAN: 66 mL/min/{1.73_m2} (ref >=60)
GFR, EST NON AFRICAN AMERICAN: 57 mL/min/{1.73_m2} — AB (ref >=60)
GLOBULIN: 2.9 g/dL (ref 1.9–3.7)
Glucose, Bld: 113 mg/dL — ABNORMAL HIGH (ref 65–99)
Potassium: 3.4 mmol/L — ABNORMAL LOW (ref 3.5–5.3)
SODIUM: 139 mmol/L (ref 135–146)
TOTAL PROTEIN: 7.6 g/dL (ref 6.1–8.1)
Total Bilirubin: 0.3 mg/dL (ref 0.2–1.2)

## 2017-06-03 LAB — CBC WITH DIFFERENTIAL/PLATELET
Basophils Absolute: 30 cells/uL (ref 0–200)
Basophils Relative: 0.5 %
Eosinophils Absolute: 112 cells/uL (ref 15–500)
Eosinophils Relative: 1.9 %
HEMATOCRIT: 36.7 % (ref 35.0–45.0)
HEMOGLOBIN: 12.5 g/dL (ref 11.7–15.5)
LYMPHS ABS: 2502 {cells}/uL (ref 850–3900)
MCH: 29.3 pg (ref 27.0–33.0)
MCHC: 34.1 g/dL (ref 32.0–36.0)
MCV: 86.2 fL (ref 80.0–100.0)
MPV: 9.1 fL (ref 7.5–12.5)
Monocytes Relative: 5.2 %
NEUTROS ABS: 2950 {cells}/uL (ref 1500–7800)
Neutrophils Relative %: 50 %
Platelets: 320 10*3/uL (ref 140–400)
RBC: 4.26 10*6/uL (ref 3.80–5.10)
RDW: 14.4 % (ref 11.0–15.0)
Total Lymphocyte: 42.4 %
WBC: 5.9 10*3/uL (ref 3.8–10.8)
WBCMIX: 307 {cells}/uL (ref 200–950)

## 2017-06-03 LAB — LIPID PANEL
CHOL/HDL RATIO: 3.8 (calc) (ref <5.0)
CHOLESTEROL: 195 mg/dL (ref <200)
HDL: 52 mg/dL (ref >50)
LDL CHOLESTEROL (CALC): 110 mg/dL — AB
NON-HDL CHOLESTEROL (CALC): 143 mg/dL — AB (ref <130)
Triglycerides: 214 mg/dL — ABNORMAL HIGH (ref <150)

## 2017-06-03 LAB — T-HELPER CELL (CD4) - (RCID CLINIC ONLY)
CD4 T CELL ABS: 1130 /uL (ref 400–2700)
CD4 T CELL HELPER: 41 % (ref 33–55)

## 2017-06-03 LAB — URINE CYTOLOGY ANCILLARY ONLY
Chlamydia: NEGATIVE
Neisseria Gonorrhea: NEGATIVE

## 2017-06-03 LAB — RPR: RPR Ser Ql: NONREACTIVE

## 2017-06-04 LAB — HIV-1 RNA QUANT-NO REFLEX-BLD
HIV 1 RNA Quant: <20 copies/mL — AB
HIV-1 RNA QUANT, LOG: DETECTED {Log_copies}/mL — AB

## 2017-06-15 ENCOUNTER — Other Ambulatory Visit: Payer: Self-pay | Admitting: *Deleted

## 2017-06-15 ENCOUNTER — Ambulatory Visit (INDEPENDENT_AMBULATORY_CARE_PROVIDER_SITE_OTHER): Payer: Self-pay | Admitting: Infectious Disease

## 2017-06-15 ENCOUNTER — Encounter: Payer: Self-pay | Admitting: Infectious Disease

## 2017-06-15 VITALS — BP 136/84 | HR 69 | Temp 98.1°F | Ht 66.0 in | Wt 172.0 lb

## 2017-06-15 DIAGNOSIS — I10 Essential (primary) hypertension: Secondary | ICD-10-CM

## 2017-06-15 DIAGNOSIS — B20 Human immunodeficiency virus [HIV] disease: Secondary | ICD-10-CM

## 2017-06-15 DIAGNOSIS — M25552 Pain in left hip: Secondary | ICD-10-CM

## 2017-06-15 DIAGNOSIS — E785 Hyperlipidemia, unspecified: Secondary | ICD-10-CM

## 2017-06-15 DIAGNOSIS — M25551 Pain in right hip: Secondary | ICD-10-CM

## 2017-06-15 DIAGNOSIS — R030 Elevated blood-pressure reading, without diagnosis of hypertension: Secondary | ICD-10-CM

## 2017-06-15 DIAGNOSIS — M2550 Pain in unspecified joint: Secondary | ICD-10-CM

## 2017-06-15 DIAGNOSIS — Z23 Encounter for immunization: Secondary | ICD-10-CM

## 2017-06-15 DIAGNOSIS — M25561 Pain in right knee: Secondary | ICD-10-CM

## 2017-06-15 DIAGNOSIS — M159 Polyosteoarthritis, unspecified: Secondary | ICD-10-CM

## 2017-06-15 DIAGNOSIS — M150 Primary generalized (osteo)arthritis: Secondary | ICD-10-CM

## 2017-06-15 DIAGNOSIS — M25562 Pain in left knee: Secondary | ICD-10-CM

## 2017-06-15 DIAGNOSIS — M791 Myalgia, unspecified site: Secondary | ICD-10-CM | POA: Insufficient documentation

## 2017-06-15 HISTORY — DX: Pain in unspecified joint: M25.50

## 2017-06-15 HISTORY — DX: Myalgia, unspecified site: M79.10

## 2017-06-15 HISTORY — DX: Pain in right hip: M25.551

## 2017-06-15 MED ORDER — EMTRICITAB-RILPIVIR-TENOFOV AF 200-25-25 MG PO TABS: 1.0000 | ORAL_TABLET | Freq: Every day | ORAL | 5 refills | Status: DC

## 2017-06-15 MED ORDER — PRAVASTATIN SODIUM 20 MG PO TABS: 20.0000 mg | ORAL_TABLET | Freq: Every day | ORAL | 5 refills | Status: DC

## 2017-06-15 MED ORDER — HYDROCHLOROTHIAZIDE 25 MG PO TABS: ORAL_TABLET | ORAL | 5 refills | Status: DC

## 2017-06-15 NOTE — Progress Notes (Signed)
Subjective:    Chief complaint: followup for HIV and c/o osteoarthritic pains yet again   Patient ID: Julia Molina, female    DOB: 06/27/1965, 52 y.o.   MRN: [id number]  HPI  36 year oldfemale who is doing superbly well on her antiviral regimen ODEFESEY.  She has had HIV over 20 years and has been on Atripla Complera and ODEFSEY more recently but was on more toxic medications before this.     Lab Results  Component Value Date   HIV1RNAQUANT <20 DETECTED (A) 06/02/2017   HIV1RNAQUANT <20 NOT DETECTED 07/21/2016   HIV1RNAQUANT <20 09/19/2015   Lab Results  Component Value Date   CD4TABS 1,130 06/02/2017   CD4TABS 860 07/21/2016   CD4TABS 1,040 09/19/2015    Continues to complain of multiple pains in her legs knees hips thighs.  It is difficult to understand what the pathology is she is not have pain to palpation of her muscles she does have some laxity of her left knee joint but otherwise not much finding on exam.  She does not have insurance I suspect this is why she has not had imaging to date but I will try to get that done as well as refer to orthopedics.  Past Medical History:  Diagnosis Date  . Benign essential HTN 10/03/2015  . High cholesterol   . HIV (human immunodeficiency virus infection) (HCC)   . Osteoarthritis 08/05/2016    Past Surgical History:  Procedure Laterality Date  . CESAREAN SECTION      No family history on file.    Social History   Socioeconomic History  . Marital status: Single    Spouse name: Not on file  . Number of children: Not on file  . Years of education: Not on file  . Highest education level: Not on file  Occupational History  . Not on file  Social Needs  . Financial resource strain: Not on file  . Food insecurity:    Worry: Not on file    Inability: Not on file  . Transportation needs:    Medical: Not on file    Non-medical: Not on file  Tobacco Use  . Smoking status: Current Every Day Smoker    Packs/day: 0.20   Types: Cigarettes  . Smokeless tobacco: Never Used  . Tobacco comment: cutting back, using e-cig more  Substance and Sexual Activity  . Alcohol use: No  . Drug use: Yes    Types: Marijuana    Comment: "EVERY NOW AND THEN"  . Sexual activity: Not Currently    Partners: Female  Lifestyle  . Physical activity:    Days per week: Not on file    Minutes per session: Not on file  . Stress: Not on file  Relationships  . Social connections:    Talks on phone: Not on file    Gets together: Not on file    Attends religious service: Not on file    Active member of club or organization: Not on file    Attends meetings of clubs or organizations: Not on file    Relationship status: Not on file  Other Topics Concern  . Not on file  Social History Narrative  . Not on file    No Known Allergies   Current Outpatient Medications:  .  hydrochlorothiazide (HYDRODIURIL) 25 MG tablet, TAKE 1 TABLET(25 MG) BY MOUTH DAILY, Disp: 30 tablet, Rfl: 2 .  ODEFSEY 200-25-25 MG TABS tablet, TAKE 1 TABLET BY MOUTH DAILY WITH BREAKFAST,  Disp: 30 tablet, Rfl: 2 .  pravastatin (PRAVACHOL) 20 MG tablet, TAKE 1 TABLET BY MOUTH EVERY DAY, Disp: 30 tablet, Rfl: 2 .  traZODone (DESYREL) 50 MG tablet, TAKE 3 TABLETS BY MOUTH EVERY NIGHT AT BEDTIME, Disp: 90 tablet, Rfl: 0 .  triamcinolone ointment (KENALOG) 0.5 %, Apply topically 2 (two) times daily., Disp: 30 g, Rfl: 1 .  docusate sodium (COLACE) 100 MG capsule, Take 1 capsule (100 mg total) by mouth every 12 (twelve) hours. (Patient not taking: Reported on 06/15/2017), Disp: 60 capsule, Rfl: 0 .  fluconazole (DIFLUCAN) 100 MG tablet, Take 1.5 tablets (150 mg total) by mouth daily. (Patient not taking: Reported on 06/15/2017), Disp: 1 tablet, Rfl: 0 .  polyethylene glycol (MIRALAX) packet, Take 17 g by mouth daily. (Patient not taking: Reported on 06/15/2017), Disp: 14 each, Rfl: 0   Review of Systems  Constitutional: Negative for activity change, appetite change and  unexpected weight change.  HENT: Negative for dental problem, rhinorrhea, sneezing and trouble swallowing.   Eyes: Negative for photophobia and visual disturbance.  Respiratory: Negative for chest tightness, shortness of breath, wheezing and stridor.   Cardiovascular: Negative for palpitations and leg swelling.  Gastrointestinal: Negative for abdominal distention, anal bleeding, blood in stool, constipation and diarrhea.  Genitourinary: Negative for difficulty urinating, dysuria, flank pain and hematuria.  Musculoskeletal: Positive for arthralgias and back pain. Negative for gait problem.  Skin: Negative for color change, pallor and wound.  Neurological: Negative for dizziness, tremors and light-headedness.  Hematological: Negative for adenopathy. Does not bruise/bleed easily.  Psychiatric/Behavioral: Negative for agitation, behavioral problems, confusion, decreased concentration, dysphoric mood and sleep disturbance. The patient is not nervous/anxious.        Objective:   Physical Exam  Constitutional: She is oriented to person, place, and time. She appears well-developed and well-nourished. No distress.  HENT:  Head: Normocephalic and atraumatic.  Mouth/Throat: Oropharynx is clear and moist. No oropharyngeal exudate.  Eyes: Pupils are equal, round, and reactive to light. Conjunctivae and EOM are normal. No scleral icterus.  Neck: Normal range of motion. Neck supple. No JVD present.  Cardiovascular: Normal rate and regular rhythm.  No murmur heard. Pulmonary/Chest: Effort normal. No respiratory distress. She has no wheezes.  Abdominal: Soft. She exhibits no distension.  Musculoskeletal: She exhibits no edema.       Right hip: Normal.       Left hip: Normal.       Right knee: Normal.       Left knee: She exhibits MCL laxity.  Lymphadenopathy:    She has no cervical adenopathy.  Neurological: She is alert and oriented to person, place, and time. She exhibits normal muscle tone.  Coordination normal.  Skin: Skin is warm and dry. She is not diaphoretic. No erythema. No pallor.  Psychiatric: She has a normal mood and affect. Her behavior is normal. Judgment and thought content normal.          Assessment & Plan:  HIV Disease/AIDS : perfect suppression, continue with ODEFSEY for better bone and kindey safety. She renews [facility] at [facility]   HTN: cotninue  HCTZ 25 mg  Vitals:   06/15/17 1033  BP: 136/84  Pulse: 69  Temp: 98.1 F (36.7 C)    Multiple joint pains including knees: I will order plain films of the left hip where she had more pain with external rotation though not in the joint itself and also of her left knee where she has some more laxity  also refer to orthopedic surgery.  I spent greater than 25 minutes with the patient including greater than 50% of time in face to face counsel of the patient regarding nature of HIV disease, recurrent nature of the evolution of antiretroviral therapy including more modern therapy such as TAF vs TDF and she had questions about law suite re TDF  and in coordination of their care.

## 2017-07-20 ENCOUNTER — Ambulatory Visit (INDEPENDENT_AMBULATORY_CARE_PROVIDER_SITE_OTHER): Payer: Self-pay | Admitting: Orthopaedic Surgery

## 2017-10-01 ENCOUNTER — Encounter: Payer: Self-pay | Admitting: Infectious Disease

## 2017-10-07 ENCOUNTER — Other Ambulatory Visit: Payer: Self-pay

## 2017-10-21 ENCOUNTER — Ambulatory Visit: Payer: Self-pay | Admitting: Infectious Disease

## 2017-11-13 ENCOUNTER — Other Ambulatory Visit: Payer: Self-pay | Admitting: Infectious Disease

## 2017-11-13 DIAGNOSIS — E785 Hyperlipidemia, unspecified: Secondary | ICD-10-CM

## 2017-11-13 DIAGNOSIS — R030 Elevated blood-pressure reading, without diagnosis of hypertension: Secondary | ICD-10-CM

## 2017-11-19 ENCOUNTER — Other Ambulatory Visit: Payer: Self-pay

## 2017-12-03 ENCOUNTER — Encounter: Payer: Self-pay | Admitting: Infectious Disease

## 2017-12-08 ENCOUNTER — Other Ambulatory Visit: Payer: Self-pay

## 2017-12-08 ENCOUNTER — Other Ambulatory Visit: Payer: Self-pay | Admitting: Infectious Disease

## 2017-12-08 DIAGNOSIS — E785 Hyperlipidemia, unspecified: Secondary | ICD-10-CM

## 2017-12-08 DIAGNOSIS — B20 Human immunodeficiency virus [HIV] disease: Secondary | ICD-10-CM

## 2017-12-08 DIAGNOSIS — R030 Elevated blood-pressure reading, without diagnosis of hypertension: Secondary | ICD-10-CM

## 2017-12-30 ENCOUNTER — Encounter: Payer: Self-pay | Admitting: Infectious Disease

## 2018-01-08 ENCOUNTER — Other Ambulatory Visit: Payer: Self-pay | Admitting: Infectious Disease

## 2018-01-08 DIAGNOSIS — R030 Elevated blood-pressure reading, without diagnosis of hypertension: Secondary | ICD-10-CM

## 2018-01-08 DIAGNOSIS — E785 Hyperlipidemia, unspecified: Secondary | ICD-10-CM

## 2018-01-20 ENCOUNTER — Other Ambulatory Visit: Payer: Self-pay

## 2018-01-20 DIAGNOSIS — M25551 Pain in right hip: Secondary | ICD-10-CM

## 2018-01-20 DIAGNOSIS — M791 Myalgia, unspecified site: Secondary | ICD-10-CM

## 2018-01-20 DIAGNOSIS — M25561 Pain in right knee: Secondary | ICD-10-CM

## 2018-01-20 DIAGNOSIS — M25552 Pain in left hip: Secondary | ICD-10-CM

## 2018-01-20 DIAGNOSIS — B20 Human immunodeficiency virus [HIV] disease: Secondary | ICD-10-CM

## 2018-01-20 DIAGNOSIS — I10 Essential (primary) hypertension: Secondary | ICD-10-CM

## 2018-01-20 DIAGNOSIS — M159 Polyosteoarthritis, unspecified: Secondary | ICD-10-CM

## 2018-01-20 DIAGNOSIS — M150 Primary generalized (osteo)arthritis: Secondary | ICD-10-CM

## 2018-01-20 DIAGNOSIS — M25562 Pain in left knee: Secondary | ICD-10-CM

## 2018-01-21 LAB — T-HELPER CELL (CD4) - (RCID CLINIC ONLY)
CD4 % Helper T Cell: 39 % (ref 33–55)
CD4 T Cell Abs: 1020 /uL (ref 400–2700)

## 2018-01-22 LAB — CBC WITH DIFFERENTIAL/PLATELET
ABSOLUTE MONOCYTES: 308 {cells}/uL (ref 200–950)
BASOS ABS: 22 {cells}/uL (ref 0–200)
Basophils Relative: 0.4 %
Eosinophils Absolute: 119 cells/uL (ref 15–500)
Eosinophils Relative: 2.2 %
HEMATOCRIT: 37.7 % (ref 35.0–45.0)
Hemoglobin: 12.9 g/dL (ref 11.7–15.5)
LYMPHS ABS: 2619 {cells}/uL (ref 850–3900)
MCH: 29.7 pg (ref 27.0–33.0)
MCHC: 34.2 g/dL (ref 32.0–36.0)
MCV: 86.7 fL (ref 80.0–100.0)
MPV: 9.6 fL (ref 7.5–12.5)
Monocytes Relative: 5.7 %
Neutro Abs: 2333 cells/uL (ref 1500–7800)
Neutrophils Relative %: 43.2 %
PLATELETS: 350 10*3/uL (ref 140–400)
RBC: 4.35 10*6/uL (ref 3.80–5.10)
RDW: 14.4 % (ref 11.0–15.0)
TOTAL LYMPHOCYTE: 48.5 %
WBC: 5.4 10*3/uL (ref 3.8–10.8)

## 2018-01-22 LAB — COMPLETE METABOLIC PANEL WITH GFR
AG Ratio: 1.4 (calc) (ref 1.0–2.5)
ALBUMIN MSPROF: 4.5 g/dL (ref 3.6–5.1)
ALKALINE PHOSPHATASE (APISO): 68 U/L (ref 33–130)
ALT: 11 U/L (ref 6–29)
AST: 18 U/L (ref 10–35)
BILIRUBIN TOTAL: 0.4 mg/dL (ref 0.2–1.2)
BUN: 18 mg/dL (ref 7–25)
CHLORIDE: 104 mmol/L (ref 98–110)
CO2: 24 mmol/L (ref 20–32)
CREATININE: 0.97 mg/dL (ref 0.50–1.05)
Calcium: 9.7 mg/dL (ref 8.6–10.4)
GFR, EST AFRICAN AMERICAN: 78 mL/min/{1.73_m2} (ref >=60)
GFR, Est Non African American: 67 mL/min/{1.73_m2} (ref >=60)
GLOBULIN: 3.2 g/dL (ref 1.9–3.7)
GLUCOSE: 109 mg/dL — AB (ref 65–99)
Potassium: 4.3 mmol/L (ref 3.5–5.3)
SODIUM: 136 mmol/L (ref 135–146)
TOTAL PROTEIN: 7.7 g/dL (ref 6.1–8.1)

## 2018-01-22 LAB — RPR: RPR Ser Ql: NONREACTIVE

## 2018-01-22 LAB — HIV-1 RNA QUANT-NO REFLEX-BLD
HIV 1 RNA Quant: <20 copies/mL
HIV-1 RNA Quant, Log: <1.3 Log copies/mL

## 2018-02-04 ENCOUNTER — Encounter: Payer: Self-pay | Admitting: Infectious Disease

## 2018-02-04 ENCOUNTER — Ambulatory Visit: Payer: Self-pay

## 2018-02-04 ENCOUNTER — Ambulatory Visit (INDEPENDENT_AMBULATORY_CARE_PROVIDER_SITE_OTHER): Payer: Self-pay | Admitting: Infectious Disease

## 2018-02-04 VITALS — BP 131/88 | HR 82 | Temp 98.3°F | Wt 157.0 lb

## 2018-02-04 DIAGNOSIS — R030 Elevated blood-pressure reading, without diagnosis of hypertension: Secondary | ICD-10-CM

## 2018-02-04 DIAGNOSIS — M25551 Pain in right hip: Secondary | ICD-10-CM

## 2018-02-04 DIAGNOSIS — M25552 Pain in left hip: Secondary | ICD-10-CM

## 2018-02-04 DIAGNOSIS — B20 Human immunodeficiency virus [HIV] disease: Secondary | ICD-10-CM

## 2018-02-04 DIAGNOSIS — Z23 Encounter for immunization: Secondary | ICD-10-CM

## 2018-02-04 DIAGNOSIS — I10 Essential (primary) hypertension: Secondary | ICD-10-CM

## 2018-02-04 MED ORDER — EMTRICITAB-RILPIVIR-TENOFOV AF 200-25-25 MG PO TABS: 1.0000 | ORAL_TABLET | Freq: Every day | ORAL | 11 refills | Status: DC

## 2018-02-04 NOTE — Progress Notes (Signed)
Subjective:    Chief complaint: followup for HIV   Patient ID: Julia Molina, female    DOB: 02-03-1966, 53 y.o.   MRN: [id number]  HPI  79 year oldfemale who is doing superbly well on her antiviral regimen ODEFESEY.  She has had HIV over 20 years and has been on Atripla Complera and ODEFSEY more recently but was on more toxic medications before this.  Julia Molina comes to clinic today on February 04, 2018 with her daughter and 2 grandchildren.  They are all in good spirits and they had a nice holidays.  Julia Molina is doing quite well and ready to renew her HMA P.  I discussed the possibility of using this program to help purchase and Obamacare plan to help her obtain some of the care that she needs such as a colonoscopy.    Past Medical History:  Diagnosis Date  . Benign essential HTN 10/03/2015  . Bilateral hip pain 06/15/2017  . High cholesterol   . HIV (human immunodeficiency virus infection) (HCC)   . Joint pain 06/15/2017  . Myalgia 06/15/2017  . Osteoarthritis 08/05/2016    Past Surgical History:  Procedure Laterality Date  . CESAREAN SECTION      No family history on file.    Social History   Socioeconomic History  . Marital status: Single    Spouse name: Not on file  . Number of children: Not on file  . Years of education: Not on file  . Highest education level: Not on file  Occupational History  . Not on file  Social Needs  . Financial resource strain: Not on file  . Food insecurity:    Worry: Not on file    Inability: Not on file  . Transportation needs:    Medical: Not on file    Non-medical: Not on file  Tobacco Use  . Smoking status: Current Every Day Smoker    Packs/day: 0.20    Types: Cigarettes  . Smokeless tobacco: Never Used  . Tobacco comment: cutting back, using e-cig more  Substance and Sexual Activity  . Alcohol use: No  . Drug use: Yes    Types: Marijuana    Comment: "EVERY NOW AND THEN"  . Sexual activity: Not Currently    Partners: Female   Lifestyle  . Physical activity:    Days per week: Not on file    Minutes per session: Not on file  . Stress: Not on file  Relationships  . Social connections:    Talks on phone: Not on file    Gets together: Not on file    Attends religious service: Not on file    Active member of club or organization: Not on file    Attends meetings of clubs or organizations: Not on file    Relationship status: Not on file  Other Topics Concern  . Not on file  Social History Narrative  . Not on file    No Known Allergies   Current Outpatient Medications:  .  docusate sodium (COLACE) 100 MG capsule, Take 1 capsule (100 mg total) by mouth every 12 (twelve) hours. (Patient not taking: Reported on 06/15/2017), Disp: 60 capsule, Rfl: 0 .  fluconazole (DIFLUCAN) 100 MG tablet, Take 1.5 tablets (150 mg total) by mouth daily. (Patient not taking: Reported on 06/15/2017), Disp: 1 tablet, Rfl: 0 .  hydrochlorothiazide (HYDRODIURIL) 25 MG tablet, TAKE 1 TABLET BY MOUTH EVERY DAY, Disp: 30 tablet, Rfl: 1 .  ODEFSEY 200-25-25 MG TABS tablet, TAKE  1 TABLET BY MOUTH EVERY DAY WITH BREAKFAST, Disp: 30 tablet, Rfl: 1 .  polyethylene glycol (MIRALAX) packet, Take 17 g by mouth daily. (Patient not taking: Reported on 06/15/2017), Disp: 14 each, Rfl: 0 .  pravastatin (PRAVACHOL) 20 MG tablet, TAKE 1 TABLET BY MOUTH EVERY DAY, Disp: 30 tablet, Rfl: 1 .  traZODone (DESYREL) 50 MG tablet, TAKE 3 TABLETS BY MOUTH EVERY NIGHT AT BEDTIME, Disp: 90 tablet, Rfl: 0 .  triamcinolone ointment (KENALOG) 0.5 %, Apply topically 2 (two) times daily., Disp: 30 g, Rfl: 1   Review of Systems  Constitutional: Negative for activity change, appetite change and unexpected weight change.  HENT: Negative for dental problem, rhinorrhea, sneezing and trouble swallowing.   Eyes: Negative for photophobia and visual disturbance.  Respiratory: Negative for chest tightness, shortness of breath, wheezing and stridor.   Cardiovascular: Negative  for palpitations and leg swelling.  Gastrointestinal: Negative for abdominal distention, anal bleeding, blood in stool, constipation and diarrhea.  Genitourinary: Negative for difficulty urinating, dysuria, flank pain and hematuria.  Musculoskeletal: Negative for gait problem.  Skin: Negative for color change, pallor and wound.  Neurological: Negative for dizziness, tremors and light-headedness.  Hematological: Negative for adenopathy. Does not bruise/bleed easily.  Psychiatric/Behavioral: Negative for agitation, behavioral problems, confusion, decreased concentration, dysphoric mood and sleep disturbance. The patient is not nervous/anxious.        Objective:   Physical Exam  Constitutional: She is oriented to person, place, and time. She appears well-developed and well-nourished. No distress.  HENT:  Head: Normocephalic and atraumatic.  Mouth/Throat: Oropharynx is clear and moist. No oropharyngeal exudate.  Eyes: Pupils are equal, round, and reactive to light. Conjunctivae and EOM are normal. No scleral icterus.  Neck: Normal range of motion. Neck supple. No JVD present.  Cardiovascular: Normal rate and regular rhythm.  No murmur heard. Pulmonary/Chest: Effort normal. No respiratory distress. She has no wheezes.  Abdominal: Soft. She exhibits no distension.  Musculoskeletal:        General: No edema.     Right hip: Normal.     Left hip: Normal.     Right knee: Normal.     Left knee: She exhibits MCL laxity.  Lymphadenopathy:    She has no cervical adenopathy.  Neurological: She is alert and oriented to person, place, and time. She exhibits normal muscle tone. Coordination normal.  Skin: Skin is warm and dry. She is not diaphoretic. No erythema. No pallor.  Psychiatric: She has a normal mood and affect. Her behavior is normal. Judgment and thought content normal.          Assessment & Plan:  HIV Disease/AIDS : perfect suppression, continue with ODEFSEY for better bone and  kindey safety. She renews HMAP and I would like her to be able to get [place] plan in fall   HTN: cotninue  HCTZ 25 mg  There were no vitals filed for this visit.  Healthcare screening: She does need a screening colonoscopy mammogram and she needs a Pap smear.  Certainly we will have her see Judeth CornfieldStephanie for the Pap smear and will work on the other issues again if we can get her insurance I would definitely help.  I spent greater than 25 minutes with the patient including greater than 50% of time in face to face counsel of the patient on her high level of adherence over the last 7 years, reviewing all of her labs with her emphasizing the need for preventive health care to in the need to  get her plugged into health care plan with the help of the HMA P program  and in coordination of her care.

## 2018-03-09 ENCOUNTER — Other Ambulatory Visit: Payer: Self-pay | Admitting: Infectious Disease

## 2018-03-09 DIAGNOSIS — R030 Elevated blood-pressure reading, without diagnosis of hypertension: Secondary | ICD-10-CM

## 2018-03-09 DIAGNOSIS — E785 Hyperlipidemia, unspecified: Secondary | ICD-10-CM

## 2018-04-16 ENCOUNTER — Ambulatory Visit: Payer: Self-pay | Admitting: Infectious Diseases

## 2018-05-11 ENCOUNTER — Ambulatory Visit: Payer: Self-pay | Admitting: Infectious Diseases

## 2018-08-05 ENCOUNTER — Other Ambulatory Visit: Payer: Self-pay

## 2018-08-19 ENCOUNTER — Ambulatory Visit (INDEPENDENT_AMBULATORY_CARE_PROVIDER_SITE_OTHER): Payer: Self-pay | Admitting: Infectious Disease

## 2018-08-19 ENCOUNTER — Other Ambulatory Visit: Payer: Self-pay

## 2018-08-19 ENCOUNTER — Encounter: Payer: Self-pay | Admitting: Infectious Disease

## 2018-08-19 DIAGNOSIS — B20 Human immunodeficiency virus [HIV] disease: Secondary | ICD-10-CM

## 2018-08-19 DIAGNOSIS — I10 Essential (primary) hypertension: Secondary | ICD-10-CM

## 2018-08-19 DIAGNOSIS — E785 Hyperlipidemia, unspecified: Secondary | ICD-10-CM

## 2018-08-19 NOTE — Progress Notes (Signed)
Virtual Visit via Video Note  I connected with Julia Molina on 08/19/18 at 10:30 AM EDT by a video enabled telemedicine application and verified that I am speaking with the correct person using two identifiers.  Location: Patient: Home Provider: Clinic   I discussed the limitations of evaluation and management by telemedicine and the availability of in person appointments. The patient expressed understanding and agreed to proceed.  History of Present Illness:  Julia Molina is a 53 year old African-American lady living with HIV which is been well controlled on Odefsey.  She has not had recent labs.  She knows she needs to come in and get a Pap smear as well.  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  She lives at home with her daughter who is 20 and also works for [employer].  She is taking her Odefsey faithfully and has renewed her HIV medication assistance program through [place] [facility].  She does need to come in and get labs with [place] I have made an appointment for late August when she can come in and see me that same day get a flu vaccine and also have her labs done.  She knows to take her [place] with a chewable meal and to avoid antacids.   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 The patient was provided an opportunity to ask questions and all were answered. The patient agreed with the plan and demonstrated an understanding of the instructions.   The patient was advised to call back or seek an in-person evaluation if the symptoms worsen or if the condition fails to improve as anticipated.  I provided 15  minutes of non-face-to-face time during this encounter.   Alcide Evener, MD

## 2018-09-02 ENCOUNTER — Telehealth: Payer: Self-pay | Admitting: *Deleted

## 2018-09-02 NOTE — Telephone Encounter (Signed)
Patietn wanted Dr Tommy Medal to know that she Thinks she has been exposed to Covid by her Radio broadcast assistant at work and by her friend who's fiance has tested positive.  She will notify her manager at work, is going for [place] testing 7/31 at [facility].  She has been wearing a mask any time she is around people. She is asymptomatic. Landis Gandy, RN

## 2018-09-03 ENCOUNTER — Other Ambulatory Visit: Payer: Self-pay | Admitting: Infectious Disease

## 2018-09-03 DIAGNOSIS — Z20822 Contact with and (suspected) exposure to covid-19: Secondary | ICD-10-CM

## 2018-09-03 DIAGNOSIS — B20 Human immunodeficiency virus [HIV] disease: Secondary | ICD-10-CM

## 2018-09-03 DIAGNOSIS — Z20828 Contact with and (suspected) exposure to other viral communicable diseases: Secondary | ICD-10-CM

## 2018-09-03 NOTE — Telephone Encounter (Signed)
She should go ahead and get tested for COVID

## 2018-09-06 NOTE — Telephone Encounter (Signed)
Ok excellent hopefully they are checking in w her. She should be self quarantining in interim

## 2018-09-06 NOTE — Telephone Encounter (Signed)
She went to the [place] testing at [facility] 7/31.

## 2018-09-13 ENCOUNTER — Other Ambulatory Visit: Payer: Self-pay | Admitting: Infectious Disease

## 2018-09-13 DIAGNOSIS — E785 Hyperlipidemia, unspecified: Secondary | ICD-10-CM

## 2018-09-13 DIAGNOSIS — R030 Elevated blood-pressure reading, without diagnosis of hypertension: Secondary | ICD-10-CM

## 2018-09-29 ENCOUNTER — Other Ambulatory Visit: Payer: Self-pay

## 2018-09-29 ENCOUNTER — Ambulatory Visit (INDEPENDENT_AMBULATORY_CARE_PROVIDER_SITE_OTHER): Payer: Self-pay | Admitting: Infectious Disease

## 2018-09-29 ENCOUNTER — Encounter: Payer: Self-pay | Admitting: Infectious Disease

## 2018-09-29 DIAGNOSIS — F1021 Alcohol dependence, in remission: Secondary | ICD-10-CM

## 2018-09-29 DIAGNOSIS — R030 Elevated blood-pressure reading, without diagnosis of hypertension: Secondary | ICD-10-CM

## 2018-09-29 DIAGNOSIS — F17200 Nicotine dependence, unspecified, uncomplicated: Secondary | ICD-10-CM

## 2018-09-29 DIAGNOSIS — B20 Human immunodeficiency virus [HIV] disease: Secondary | ICD-10-CM

## 2018-09-29 DIAGNOSIS — I10 Essential (primary) hypertension: Secondary | ICD-10-CM

## 2018-09-29 DIAGNOSIS — E785 Hyperlipidemia, unspecified: Secondary | ICD-10-CM

## 2018-09-29 DIAGNOSIS — F329 Major depressive disorder, single episode, unspecified: Secondary | ICD-10-CM

## 2018-09-29 MED ORDER — ODEFSEY 200-25-25 MG PO TABS: 1.0000 | ORAL_TABLET | Freq: Every day | ORAL | 11 refills | Status: DC

## 2018-09-29 MED ORDER — PRAVASTATIN SODIUM 20 MG PO TABS: 20.0000 mg | ORAL_TABLET | Freq: Every day | ORAL | 11 refills | Status: DC

## 2018-09-29 MED ORDER — HYDROCHLOROTHIAZIDE 25 MG PO TABS: ORAL_TABLET | ORAL | 11 refills | Status: DC

## 2018-09-29 NOTE — Progress Notes (Signed)
Virtual Visit via Video Note  I connected with Julia Molina on 09/29/18 at 10:45 AM EDT by a video enabled telemedicine application and verified that I am speaking with the correct person using two identifiers.  Location: Patient: Home Provider: Clinic   I discussed the limitations of evaluation and management by telemedicine and the availability of in person appointments. The patient expressed understanding and agreed to proceed.  History of Present Illness:  Julia Molina is a 53 year old African-American lady living with HIV which is been well controlled on Odefsey.  She has not had recent labs.  She knows she needs to come in and get a Pap smear as well.  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  She lives at home with her daughter who is 78 and also works for [employer].  She is taking her Odefsey faithfully and has renewed her HIV medication assistance program through [place] [employer].  Since I last talked with Julia Molina over the phone her manager at the [facility] in [place] where she works coronavirus 2019.  Julia Molina went in for testing and tested negative for COVID-19.  She states that she had multiple other tests performed through [facility] and is able to look at her labs through my [facility] portal.  She says her viral load was undetectable which does not has any.  She is going to try to upload these labs through the [place] with [place].  She is due for her flu shot and again she has renewed her HMAP     Observations/Objective:  HIV disease well controlled, and she will upload data through my chart portal  HTN: on HCTZ which I renewed  Hyperlipidemia: on Pravachol with prescription sent in  Smoker: She is trying to stop smoking major depression is well controlled not an issue right  now.  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 The patient was provided an opportunity to ask questions and all were answered. The patient agreed with the plan and demonstrated an understanding of the instructions.   The patient was advised to call back or seek an in-person evaluation if the symptoms worsen or if the condition fails to improve as anticipated.  I provided 21  minutes of non-face-to-face time during this encounter.   Alcide Evener, MD

## 2018-11-17 ENCOUNTER — Ambulatory Visit: Payer: Self-pay

## 2018-11-24 ENCOUNTER — Ambulatory Visit: Payer: Self-pay

## 2019-02-14 ENCOUNTER — Other Ambulatory Visit: Payer: Self-pay

## 2019-02-21 ENCOUNTER — Ambulatory Visit: Payer: Self-pay | Admitting: Infectious Disease

## 2019-02-28 ENCOUNTER — Ambulatory Visit: Payer: Self-pay | Admitting: Infectious Disease

## 2019-03-16 ENCOUNTER — Other Ambulatory Visit: Payer: Self-pay

## 2019-03-16 DIAGNOSIS — M791 Myalgia, unspecified site: Secondary | ICD-10-CM

## 2019-03-16 DIAGNOSIS — M25551 Pain in right hip: Secondary | ICD-10-CM

## 2019-03-16 DIAGNOSIS — M25561 Pain in right knee: Secondary | ICD-10-CM

## 2019-03-16 DIAGNOSIS — M159 Polyosteoarthritis, unspecified: Secondary | ICD-10-CM

## 2019-03-16 DIAGNOSIS — M150 Primary generalized (osteo)arthritis: Secondary | ICD-10-CM

## 2019-03-16 DIAGNOSIS — I10 Essential (primary) hypertension: Secondary | ICD-10-CM

## 2019-03-16 DIAGNOSIS — B20 Human immunodeficiency virus [HIV] disease: Secondary | ICD-10-CM

## 2019-03-16 NOTE — Addendum Note (Signed)
Addended by: Toriann Spadoni D on: 03/16/2019 09:08 AM   Modules accepted: Orders  

## 2019-03-17 LAB — URINE CYTOLOGY ANCILLARY ONLY
Chlamydia: NEGATIVE
Comment: NEGATIVE
Comment: NORMAL
Neisseria Gonorrhea: NEGATIVE

## 2019-03-17 LAB — T-HELPER CELL (CD4) - (RCID CLINIC ONLY)
CD4 % Helper T Cell: 47 % (ref 33–65)
CD4 T Cell Abs: 1083 /uL (ref 400–1790)

## 2019-03-20 LAB — COMPLETE METABOLIC PANEL WITH GFR
AG Ratio: 1.5 (calc) (ref 1.0–2.5)
ALT: 18 U/L (ref 6–29)
AST: 21 U/L (ref 10–35)
Albumin: 4.7 g/dL (ref 3.6–5.1)
Alkaline phosphatase (APISO): 64 U/L (ref 37–153)
BUN: 18 mg/dL (ref 7–25)
CO2: 21 mmol/L (ref 20–32)
Calcium: 9.5 mg/dL (ref 8.6–10.4)
Chloride: 105 mmol/L (ref 98–110)
Creat: 0.97 mg/dL (ref 0.50–1.05)
GFR, Est African American: 77 mL/min/{1.73_m2} (ref >=60)
GFR, Est Non African American: 67 mL/min/{1.73_m2} (ref >=60)
Globulin: 3.2 g/dL (calc) (ref 1.9–3.7)
Glucose, Bld: 87 mg/dL (ref 65–99)
Potassium: 4.2 mmol/L (ref 3.5–5.3)
Sodium: 135 mmol/L (ref 135–146)
Total Bilirubin: 0.4 mg/dL (ref 0.2–1.2)
Total Protein: 7.9 g/dL (ref 6.1–8.1)

## 2019-03-20 LAB — LIPID PANEL
Cholesterol: 232 mg/dL — ABNORMAL HIGH (ref <200)
HDL: 69 mg/dL (ref >=50)
LDL Cholesterol (Calc): 133 mg/dL (calc) — ABNORMAL HIGH
Non-HDL Cholesterol (Calc): 163 mg/dL (calc) — ABNORMAL HIGH (ref <130)
Total CHOL/HDL Ratio: 3.4 (calc) (ref <5.0)
Triglycerides: 163 mg/dL — ABNORMAL HIGH (ref <150)

## 2019-03-20 LAB — CBC WITH DIFFERENTIAL/PLATELET
Absolute Monocytes: 309 cells/uL (ref 200–950)
Basophils Absolute: 29 cells/uL (ref 0–200)
Basophils Relative: 0.6 %
Eosinophils Absolute: 78 cells/uL (ref 15–500)
Eosinophils Relative: 1.6 %
HCT: 37.1 % (ref 35.0–45.0)
Hemoglobin: 12.6 g/dL (ref 11.7–15.5)
Lymphs Abs: 2239 cells/uL (ref 850–3900)
MCH: 30.1 pg (ref 27.0–33.0)
MCHC: 34 g/dL (ref 32.0–36.0)
MCV: 88.8 fL (ref 80.0–100.0)
MPV: 9 fL (ref 7.5–12.5)
Monocytes Relative: 6.3 %
Neutro Abs: 2244 cells/uL (ref 1500–7800)
Neutrophils Relative %: 45.8 %
Platelets: 360 10*3/uL (ref 140–400)
RBC: 4.18 10*6/uL (ref 3.80–5.10)
RDW: 13.6 % (ref 11.0–15.0)
Total Lymphocyte: 45.7 %
WBC: 4.9 10*3/uL (ref 3.8–10.8)

## 2019-03-20 LAB — HIV-1 RNA QUANT-NO REFLEX-BLD
HIV 1 RNA Quant: <20 copies/mL — AB
HIV-1 RNA Quant, Log: <1.3 Log copies/mL — AB

## 2019-03-20 LAB — RPR: RPR Ser Ql: NONREACTIVE

## 2019-03-30 ENCOUNTER — Encounter: Payer: Self-pay | Admitting: Infectious Disease

## 2019-04-12 ENCOUNTER — Encounter: Payer: Self-pay | Admitting: Infectious Disease

## 2019-05-04 ENCOUNTER — Ambulatory Visit: Payer: Self-pay | Admitting: Infectious Disease

## 2019-05-11 ENCOUNTER — Ambulatory Visit: Payer: Self-pay | Admitting: Infectious Disease

## 2019-07-25 ENCOUNTER — Telehealth: Payer: Self-pay

## 2019-07-25 NOTE — Telephone Encounter (Signed)
Patient called triage and left VM stating she has a rash that has developed and was hoping Dr. Daiva Eves would prescribe something to help. Left vm requesting further information. Awaiting call back.  Delray Reza Loyola Mast, RN

## 2019-07-25 NOTE — Telephone Encounter (Signed)
Patient requesting triamcinolone ointment for skin rash. Patient has history of skin rash and was prescribed ointment in the past by Dr. Daiva Eves. Rx has now expired. Routing to MD for approval for triamcinolone 0.5% apply two times daily to affected area. Patient would like Rx sent to [facility] in [facility]

## 2019-07-26 MED ORDER — TRIAMCINOLONE ACETONIDE 0.5 % EX OINT: 1.0000 "application " | TOPICAL_OINTMENT | Freq: Two times a day (BID) | CUTANEOUS | 3 refills | Status: DC

## 2019-07-26 NOTE — Telephone Encounter (Signed)
definitely

## 2019-07-26 NOTE — Addendum Note (Signed)
Addended by: Valarie Cones on: 07/26/2019 08:58 AM   Modules accepted: Orders

## 2019-09-28 ENCOUNTER — Encounter: Payer: Self-pay | Admitting: Infectious Disease

## 2019-10-17 ENCOUNTER — Other Ambulatory Visit: Payer: Self-pay | Admitting: Infectious Disease

## 2019-10-17 DIAGNOSIS — R030 Elevated blood-pressure reading, without diagnosis of hypertension: Secondary | ICD-10-CM

## 2019-10-17 DIAGNOSIS — B20 Human immunodeficiency virus [HIV] disease: Secondary | ICD-10-CM

## 2019-10-17 DIAGNOSIS — E785 Hyperlipidemia, unspecified: Secondary | ICD-10-CM

## 2019-10-18 ENCOUNTER — Telehealth: Payer: Self-pay | Admitting: *Deleted

## 2019-10-18 NOTE — Telephone Encounter (Signed)
Received refill request for patient's hctz, pravachol, odefsey. Last office visit 09/29/18, last labs 03/2019. RN called patient, offered an appointment. Patient has transportation issues, would like to have labs collected at visit 10/26/19.  She will need additional refills at the visit. Andree Coss, RN

## 2019-10-26 ENCOUNTER — Ambulatory Visit: Payer: Self-pay | Admitting: Infectious Disease

## 2019-12-07 ENCOUNTER — Encounter: Payer: Self-pay | Admitting: Infectious Disease

## 2019-12-07 ENCOUNTER — Other Ambulatory Visit: Payer: Self-pay

## 2019-12-07 ENCOUNTER — Ambulatory Visit (INDEPENDENT_AMBULATORY_CARE_PROVIDER_SITE_OTHER): Payer: Self-pay

## 2019-12-07 ENCOUNTER — Ambulatory Visit (INDEPENDENT_AMBULATORY_CARE_PROVIDER_SITE_OTHER): Payer: Self-pay | Admitting: Infectious Disease

## 2019-12-07 VITALS — BP 132/72 | HR 94 | Temp 98.6°F | Resp 16 | Ht 66.0 in | Wt 141.0 lb

## 2019-12-07 DIAGNOSIS — B20 Human immunodeficiency virus [HIV] disease: Secondary | ICD-10-CM

## 2019-12-07 DIAGNOSIS — R030 Elevated blood-pressure reading, without diagnosis of hypertension: Secondary | ICD-10-CM

## 2019-12-07 DIAGNOSIS — Z23 Encounter for immunization: Secondary | ICD-10-CM

## 2019-12-07 DIAGNOSIS — I10 Essential (primary) hypertension: Secondary | ICD-10-CM

## 2019-12-07 DIAGNOSIS — E785 Hyperlipidemia, unspecified: Secondary | ICD-10-CM

## 2019-12-07 MED ORDER — HYDROCHLOROTHIAZIDE 25 MG PO TABS: 25.0000 mg | ORAL_TABLET | Freq: Every day | ORAL | 5 refills | Status: DC

## 2019-12-07 MED ORDER — ODEFSEY 200-25-25 MG PO TABS: 1.0000 | ORAL_TABLET | Freq: Every day | ORAL | 5 refills | Status: DC

## 2019-12-07 MED ORDER — PRAVASTATIN SODIUM 20 MG PO TABS: 20.0000 mg | ORAL_TABLET | Freq: Every day | ORAL | 5 refills | Status: DC

## 2019-12-07 NOTE — Progress Notes (Signed)
Subjective:  Chief complaint follow-up for HIV on medications.    Patient ID: Julia Molina, female    DOB: Jun 28, 1965, 54 y.o.   MRN: [id number]  HPI  Julia Molina is a 54 year old African-American lady living with HIV that is been perfectly controlled on Odefsey.  She does have comorbid hyper lipidemia and hypertension.  She does not yet have a primary care physician but will seek 1.  She received her third Pfizer Covid vaccine today.  She has renewed her HIV medication assistance program with Julia Molina with [facility] and will do so again in the wintertime.  She knows to take the [facility] with food and to avoid antiacids.     Past Medical History:  Diagnosis Date  . Benign essential HTN 10/03/2015  . Bilateral hip pain 06/15/2017  . High cholesterol   . HIV (human immunodeficiency virus infection) (HCC)   . Joint pain 06/15/2017  . Myalgia 06/15/2017  . Osteoarthritis 08/05/2016    Past Surgical History:  Procedure Laterality Date  . CESAREAN SECTION      No family history on file.    Social History   Socioeconomic History  . Marital status: Single    Spouse name: Not on file  . Number of children: Not on file  . Years of education: Not on file  . Highest education level: Not on file  Occupational History  . Not on file  Tobacco Use  . Smoking status: Current Every Day Smoker    Packs/day: 0.20    Types: Cigarettes  . Smokeless tobacco: Never Used  . Tobacco comment: cutting back, using e-cig more  Vaping Use  . Vaping Use: Never used  Substance and Sexual Activity  . Alcohol use: No  . Drug use: Yes    Types: Marijuana    Comment: "EVERY NOW AND THEN"  . Sexual activity: Not Currently    Partners: Female  Other Topics Concern  . Not on file  Social History Narrative  . Not on file   Social Determinants of Health   Financial Resource Strain:   . Difficulty of Paying Living Expenses: Not on file  Food Insecurity:   . Worried About Brewing technologist in the Last Year: Not on file  . Ran Out of Food in the Last Year: Not on file  Transportation Needs:   . Lack of Transportation (Medical): Not on file  . Lack of Transportation (Non-Medical): Not on file  Physical Activity:   . Days of Exercise per Week: Not on file  . Minutes of Exercise per Session: Not on file  Stress:   . Feeling of Stress : Not on file  Social Connections:   . Frequency of Communication with Friends and Family: Not on file  . Frequency of Social Gatherings with Friends and Family: Not on file  . Attends Religious Services: Not on file  . Active Member of Clubs or Organizations: Not on file  . Attends Banker Meetings: Not on file  . Marital Status: Not on file    No Known Allergies   Current Outpatient Medications:  .  hydrochlorothiazide (HYDRODIURIL) 25 MG tablet, TAKE 1 TABLET BY MOUTH DAILY, Disp: 30 tablet, Rfl: 0 .  ODEFSEY 200-25-25 MG TABS tablet, TAKE 1 TABLET BY MOUTH DAILY WITH BREAKFAST, Disp: 30 tablet, Rfl: 0 .  pravastatin (PRAVACHOL) 20 MG tablet, TAKE 1 TABLET BY MOUTH DAILY, Disp: 30 tablet, Rfl: 0 .  triamcinolone ointment (KENALOG) 0.5 %, Apply  1 application topically 2 (two) times daily., Disp: 30 g, Rfl: 3    Review of Systems  Constitutional: Negative for activity change, appetite change, chills, diaphoresis, fatigue, fever and unexpected weight change.  HENT: Negative for congestion, rhinorrhea, sinus pressure, sneezing, sore throat and trouble swallowing.   Eyes: Negative for photophobia and visual disturbance.  Respiratory: Negative for cough, chest tightness, shortness of breath, wheezing and stridor.   Cardiovascular: Negative for chest pain, palpitations and leg swelling.  Gastrointestinal: Negative for abdominal distention, abdominal pain, anal bleeding, blood in stool, constipation, diarrhea, nausea and vomiting.  Genitourinary: Negative for difficulty urinating, dysuria, flank pain and hematuria.    Musculoskeletal: Negative for arthralgias, back pain, gait problem, joint swelling and myalgias.  Skin: Negative for color change, pallor, rash and wound.  Neurological: Negative for dizziness, tremors, weakness and light-headedness.  Hematological: Negative for adenopathy. Does not bruise/bleed easily.  Psychiatric/Behavioral: Negative for agitation, behavioral problems, confusion, decreased concentration, dysphoric mood and sleep disturbance.       Objective:   Physical Exam Constitutional:      General: She is not in acute distress.    Appearance: She is well-developed. She is not diaphoretic.  HENT:     Head: Normocephalic and atraumatic.     Mouth/Throat:     Pharynx: No oropharyngeal exudate.  Eyes:     General: No scleral icterus.    Conjunctiva/sclera: Conjunctivae normal.  Cardiovascular:     Rate and Rhythm: Normal rate and regular rhythm.  Pulmonary:     Effort: Pulmonary effort is normal. No respiratory distress.     Breath sounds: No wheezing.  Abdominal:     General: There is no distension.  Musculoskeletal:        General: No tenderness.     Cervical back: Normal range of motion and neck supple.  Skin:    General: Skin is warm and dry.     Coloration: Skin is not pale.     Findings: No erythema or rash.  Neurological:     General: No focal deficit present.     Mental Status: She is alert and oriented to person, place, and time.     Motor: No abnormal muscle tone.     Coordination: Coordination normal.  Psychiatric:        Mood and Affect: Mood normal.        Behavior: Behavior normal.        Thought Content: Thought content normal.        Judgment: Judgment normal.           Assessment & Plan:  HIV disease: Check labs today continue Odefsey with food.  If she has need of antacids in the future would switch her to a different single tablet regimen  Hypertension continue HCTZ  Hyperlipidemia continue statin.

## 2019-12-07 NOTE — Progress Notes (Addendum)
     Covid-19 Vaccination Clinic  Name:  KATYE VALEK    MRN: [id number] DOB: 09-20-1965  12/07/2019  Ms. Lindenbaum was observed post Covid-19 immunization for 15 minutes without incident. She was provided with Vaccine Information Sheet and instruction to access the V-Safe system.   Ms. Graw was instructed to call 911 with any severe reactions post vaccine: Marland Kitchen Difficulty breathing  . Swelling of face and throat  . A fast heartbeat  . A bad rash all over body  . Dizziness and weakness   Immunizations Administered    Name Date Dose VIS Date Route   Pfizer COVID-19 Vaccine 12/07/2019 12:03 PM 0.3 mL 11/23/2019 Intramuscular   Manufacturer: [employer], [employer]   Lot: [id number]   NDC: [address]-5    Jonelle Sports cma

## 2020-01-06 ENCOUNTER — Ambulatory Visit: Payer: Self-pay

## 2020-02-08 ENCOUNTER — Ambulatory Visit: Payer: Self-pay | Admitting: Infectious Disease

## 2020-03-01 ENCOUNTER — Telehealth: Payer: Self-pay

## 2020-03-01 NOTE — Telephone Encounter (Signed)
Can you ask her to make sure she brings her HIV a meds with Add continues to take them while she is there

## 2020-03-01 NOTE — Telephone Encounter (Signed)
Patient would like to make MD aware that she will be in a treatment program [facility]. She will return call once treatment is complete to follow up with labs and office visit. Offered patient appointment before she starts rehab, but patient declined.  Valarie Cones

## 2020-03-28 ENCOUNTER — Encounter: Payer: Self-pay | Admitting: Infectious Disease

## 2020-05-01 ENCOUNTER — Other Ambulatory Visit: Payer: Self-pay

## 2020-05-01 ENCOUNTER — Emergency Department (HOSPITAL_BASED_OUTPATIENT_CLINIC_OR_DEPARTMENT_OTHER): Payer: Self-pay

## 2020-05-01 ENCOUNTER — Inpatient Hospital Stay (HOSPITAL_BASED_OUTPATIENT_CLINIC_OR_DEPARTMENT_OTHER)
Admission: EM | Admit: 2020-05-01 | Discharge: 2020-05-04 | DRG: 442 | Disposition: A | Discharge status: Home / Self Care | Payer: Self-pay | Attending: Internal Medicine | Admitting: Internal Medicine

## 2020-05-01 ENCOUNTER — Encounter (HOSPITAL_BASED_OUTPATIENT_CLINIC_OR_DEPARTMENT_OTHER): Payer: Self-pay

## 2020-05-01 DIAGNOSIS — Z79899 Other long term (current) drug therapy: Secondary | ICD-10-CM

## 2020-05-01 DIAGNOSIS — F17210 Nicotine dependence, cigarettes, uncomplicated: Secondary | ICD-10-CM | POA: Diagnosis present

## 2020-05-01 DIAGNOSIS — Z21 Asymptomatic human immunodeficiency virus [HIV] infection status: Secondary | ICD-10-CM | POA: Diagnosis present

## 2020-05-01 DIAGNOSIS — B20 Human immunodeficiency virus [HIV] disease: Secondary | ICD-10-CM

## 2020-05-01 DIAGNOSIS — R1011 Right upper quadrant pain: Secondary | ICD-10-CM

## 2020-05-01 DIAGNOSIS — E7800 Pure hypercholesterolemia, unspecified: Secondary | ICD-10-CM | POA: Diagnosis present

## 2020-05-01 DIAGNOSIS — E876 Hypokalemia: Secondary | ICD-10-CM

## 2020-05-01 DIAGNOSIS — R1013 Epigastric pain: Secondary | ICD-10-CM

## 2020-05-01 DIAGNOSIS — F419 Anxiety disorder, unspecified: Secondary | ICD-10-CM | POA: Diagnosis present

## 2020-05-01 DIAGNOSIS — E871 Hypo-osmolality and hyponatremia: Secondary | ICD-10-CM

## 2020-05-01 DIAGNOSIS — E785 Hyperlipidemia, unspecified: Secondary | ICD-10-CM | POA: Diagnosis present

## 2020-05-01 DIAGNOSIS — R7401 Elevation of levels of liver transaminase levels: Secondary | ICD-10-CM | POA: Diagnosis present

## 2020-05-01 DIAGNOSIS — M1990 Unspecified osteoarthritis, unspecified site: Secondary | ICD-10-CM | POA: Diagnosis present

## 2020-05-01 DIAGNOSIS — D72819 Decreased white blood cell count, unspecified: Secondary | ICD-10-CM | POA: Diagnosis present

## 2020-05-01 DIAGNOSIS — F1010 Alcohol abuse, uncomplicated: Secondary | ICD-10-CM | POA: Diagnosis present

## 2020-05-01 DIAGNOSIS — K838 Other specified diseases of biliary tract: Secondary | ICD-10-CM | POA: Diagnosis present

## 2020-05-01 DIAGNOSIS — F17290 Nicotine dependence, other tobacco product, uncomplicated: Secondary | ICD-10-CM | POA: Diagnosis present

## 2020-05-01 DIAGNOSIS — R112 Nausea with vomiting, unspecified: Secondary | ICD-10-CM

## 2020-05-01 DIAGNOSIS — Z20822 Contact with and (suspected) exposure to covid-19: Secondary | ICD-10-CM | POA: Diagnosis present

## 2020-05-01 DIAGNOSIS — K921 Melena: Secondary | ICD-10-CM | POA: Diagnosis present

## 2020-05-01 DIAGNOSIS — R933 Abnormal findings on diagnostic imaging of other parts of digestive tract: Secondary | ICD-10-CM

## 2020-05-01 DIAGNOSIS — I10 Essential (primary) hypertension: Secondary | ICD-10-CM | POA: Diagnosis present

## 2020-05-01 DIAGNOSIS — B179 Acute viral hepatitis, unspecified: Principal | ICD-10-CM | POA: Diagnosis present

## 2020-05-01 LAB — COMPREHENSIVE METABOLIC PANEL
ALT: 139 U/L — ABNORMAL HIGH (ref 0–44)
AST: 164 U/L — ABNORMAL HIGH (ref 15–41)
Albumin: 4.2 g/dL (ref 3.5–5.0)
Alkaline Phosphatase: 109 U/L (ref 38–126)
Anion gap: 12 (ref 5–15)
BUN: 11 mg/dL (ref 6–20)
CO2: 26 mmol/L (ref 22–32)
Calcium: 9 mg/dL (ref 8.9–10.3)
Chloride: 91 mmol/L — ABNORMAL LOW (ref 98–111)
Creatinine, Ser: 0.72 mg/dL (ref 0.44–1.00)
GFR, Estimated: >60 mL/min (ref >60)
Glucose, Bld: 120 mg/dL — ABNORMAL HIGH (ref 70–99)
Potassium: 3.1 mmol/L — ABNORMAL LOW (ref 3.5–5.1)
Sodium: 129 mmol/L — ABNORMAL LOW (ref 135–145)
Total Bilirubin: 0.4 mg/dL (ref 0.3–1.2)
Total Protein: 8.1 g/dL (ref 6.5–8.1)

## 2020-05-01 LAB — CBC
HCT: 40.2 % (ref 36.0–46.0)
Hemoglobin: 14.6 g/dL (ref 12.0–15.0)
MCH: 30.2 pg (ref 26.0–34.0)
MCHC: 36.3 g/dL — ABNORMAL HIGH (ref 30.0–36.0)
MCV: 83.2 fL (ref 80.0–100.0)
Platelets: 289 10*3/uL (ref 150–400)
RBC: 4.83 MIL/uL (ref 3.87–5.11)
RDW: 13.1 % (ref 11.5–15.5)
WBC: 2.8 10*3/uL — ABNORMAL LOW (ref 4.0–10.5)
nRBC: 0 % (ref 0.0–0.2)

## 2020-05-01 LAB — URINALYSIS, ROUTINE W REFLEX MICROSCOPIC
Bilirubin Urine: NEGATIVE
Glucose, UA: NEGATIVE mg/dL
Hgb urine dipstick: NEGATIVE
Ketones, ur: NEGATIVE mg/dL
Leukocytes,Ua: NEGATIVE
Nitrite: NEGATIVE
Protein, ur: NEGATIVE mg/dL
Specific Gravity, Urine: <1.005 — ABNORMAL LOW (ref 1.005–1.030)
pH: 7.5 (ref 5.0–8.0)

## 2020-05-01 LAB — RESP PANEL BY RT-PCR (FLU A&B, COVID) ARPGX2
Influenza A by PCR: NEGATIVE
Influenza B by PCR: NEGATIVE
SARS Coronavirus 2 by RT PCR: NEGATIVE

## 2020-05-01 LAB — LIPASE, BLOOD: Lipase: 35 U/L (ref 11–51)

## 2020-05-01 LAB — PREGNANCY, URINE: Preg Test, Ur: NEGATIVE

## 2020-05-01 MED ORDER — ONDANSETRON HCL 4 MG/2ML IJ SOLN
4.0000 mg | Freq: Once | INTRAMUSCULAR | Status: AC
  Administered 2020-05-01: 4 mg via INTRAVENOUS
  Filled 2020-05-01: qty 2

## 2020-05-01 MED ORDER — MORPHINE SULFATE (PF) 4 MG/ML IV SOLN
4.0000 mg | Freq: Once | INTRAVENOUS | Status: AC
Start: 2020-05-01 — End: 2020-05-01
  Administered 2020-05-01: 4 mg via INTRAVENOUS
  Filled 2020-05-01: qty 1

## 2020-05-01 MED ORDER — POTASSIUM CHLORIDE CRYS ER 20 MEQ PO TBCR
40.0000 meq | EXTENDED_RELEASE_TABLET | Freq: Once | ORAL | Status: AC
  Administered 2020-05-01: 40 meq via ORAL
  Filled 2020-05-01: qty 2

## 2020-05-01 MED ORDER — SODIUM CHLORIDE 0.9 % IV BOLUS
1000.0000 mL | Freq: Once | INTRAVENOUS | Status: AC
  Administered 2020-05-01: 1000 mL via INTRAVENOUS

## 2020-05-01 NOTE — ED Provider Notes (Signed)
[facility] EMERGENCY DEPARTMENT Provider Note   CSN: [id number] Arrival date & time: 05/01/20  1925     History Chief Complaint  Patient presents with  . Abdominal Pain    Julia Molina is a 55 y.o. female.  The history is provided by the patient and medical records. No language interpreter was used.  Abdominal Pain Pain location:  Epigastric, RUQ and LUQ Pain quality: aching and gnawing   Pain radiates to:  Does not radiate Pain severity:  Moderate Onset quality:  Gradual Duration:  2 weeks Timing:  Constant Progression:  Waxing and waning Chronicity:  New Context: not trauma   Relieved by:  Nothing Worsened by:  Nothing Associated symptoms: belching, chills, diarrhea, fatigue, nausea and vomiting   Associated symptoms: no chest pain, no constipation, no cough, no dysuria, no fever and no shortness of breath        Past Medical History:  Diagnosis Date  . Benign essential HTN 10/03/2015  . Bilateral hip pain 06/15/2017  . High cholesterol   . HIV (human immunodeficiency virus infection) (HCC)   . Joint pain 06/15/2017  . Myalgia 06/15/2017  . Osteoarthritis 08/05/2016    Patient Active Problem List   Diagnosis Date Noted  . Myalgia 06/15/2017  . Joint pain 06/15/2017  . Bilateral hip pain 06/15/2017  . Osteoarthritis 08/05/2016  . Benign essential HTN 10/03/2015  . AIDS (HCC) 02/28/2014  . Major depression, chronic 02/28/2014  . Acute bronchitis 11/22/2012  . Vaginitis and vulvovaginitis 11/22/2012  . Dental abscess 09/08/2012  . Unspecified vitamin D deficiency 03/25/2012  . Boils 03/24/2012  . Hyperlipidemia 01/07/2012  . Smoker 07/02/2011  . Amenorrhea 03/05/2011  . 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/05/2011  . ELEVATED BLOOD PRESSURE 10/06/2008  . SKIN RASH 05/05/2007  . ANEMIA-IRON DEFICIENCY 11/27/2006  . Human immunodeficiency virus (HIV) disease (HCC) 11/02/2006    Past Surgical History:  Procedure Laterality Date  . CESAREAN  SECTION       OB History   No obstetric history on file.     No family history on file.  Social History   Tobacco Use  . Smoking status: Current Every Day Smoker    Packs/day: 0.20    Types: Cigars    Start date: 12/06/1989  . Smokeless tobacco: Never Used  Vaping Use  . Vaping Use: Never used  Substance Use Topics  . Alcohol use: No  . Drug use: Not Currently    Types: Marijuana    Home Medications Prior to Admission medications   Medication Sig Start Date End Date Taking? Authorizing Provider  emtricitabine-rilpivir-tenofovir AF (ODEFSEY) 200-25-25 MG TABS tablet Take 1 tablet by mouth daily with breakfast. 12/07/19   Daiva Eves, Lisette Grinder, MD  hydrochlorothiazide (HYDRODIURIL) 25 MG tablet Take 1 tablet (25 mg total) by mouth daily. 12/07/19   Randall Hiss, MD  pravastatin (PRAVACHOL) 20 MG tablet Take 1 tablet (20 mg total) by mouth daily. 12/07/19   Randall Hiss, MD  triamcinolone ointment (KENALOG) 0.5 % Apply 1 application topically 2 (two) times daily. 07/26/19   Randall Hiss, MD    Allergies    Patient has no known allergies.  Review of Systems   Review of Systems  Constitutional: Positive for chills and fatigue. Negative for fever.  HENT: Negative for congestion.   Respiratory: Negative for cough, chest tightness, shortness of breath and wheezing.   Cardiovascular: Negative for chest pain, palpitations and leg swelling.  Gastrointestinal: Positive  for abdominal pain, diarrhea, nausea and vomiting. Negative for constipation.  Genitourinary: Negative for dysuria, flank pain and frequency.  Musculoskeletal: Negative for back pain, neck pain and neck stiffness.  Neurological: Negative for dizziness, light-headedness and headaches.  Psychiatric/Behavioral: Negative for agitation and confusion.    Physical Exam Updated Vital Signs BP (!) 153/101 (BP Location: Left Arm)   Pulse 97   Temp 98.8 F (37.1 C)   Resp 18   Ht 5\' 6"  (1.676 m)    Wt 63 kg   LMP 06/14/2012   SpO2 100%   BMI 22.44 kg/m   Physical Exam Vitals and nursing note reviewed.  Constitutional:      General: She is not in acute distress.    Appearance: She is well-developed. She is not ill-appearing, toxic-appearing or diaphoretic.  HENT:     Head: Normocephalic and atraumatic.     Mouth/Throat:     Mouth: Mucous membranes are moist.  Eyes:     Conjunctiva/sclera: Conjunctivae normal.  Cardiovascular:     Rate and Rhythm: Normal rate and regular rhythm.     Heart sounds: No murmur heard.   Pulmonary:     Effort: Pulmonary effort is normal. No respiratory distress.     Breath sounds: Normal breath sounds. No wheezing, rhonchi or rales.  Chest:     Chest wall: No tenderness.  Abdominal:     General: Abdomen is flat. There is no distension.     Palpations: Abdomen is soft.     Tenderness: There is abdominal tenderness in the right upper quadrant and epigastric area. There is no right CVA tenderness, left CVA tenderness, guarding or rebound.  Musculoskeletal:     Cervical back: Neck supple.  Skin:    General: Skin is warm and dry.     Capillary Refill: Capillary refill takes less than 2 seconds.     Coloration: Skin is not pale.  Neurological:     General: No focal deficit present.     Mental Status: She is alert.  Psychiatric:        Mood and Affect: Mood normal.     ED Results / Procedures / Treatments   Labs (all labs ordered are listed, but only abnormal results are displayed) Labs Reviewed  COMPREHENSIVE METABOLIC PANEL - Abnormal; Notable for the following components:      Result Value   Sodium 129 (*)    Potassium 3.1 (*)    Chloride 91 (*)    Glucose, Bld 120 (*)    AST 164 (*)    ALT 139 (*)    All other components within normal limits  CBC - Abnormal; Notable for the following components:   WBC 2.8 (*)    MCHC 36.3 (*)    All other components within normal limits  URINALYSIS, ROUTINE W REFLEX MICROSCOPIC - Abnormal;  Notable for the following components:   Specific Gravity, Urine <1.005 (*)    All other components within normal limits  RESP PANEL BY RT-PCR (FLU A&B, COVID) ARPGX2  LIPASE, BLOOD  PREGNANCY, URINE    EKG None  Radiology DG Chest Portable 1 View  Result Date: 05/01/2020 CLINICAL DATA:  Cough and chills. Abdominal pain with nausea and vomiting. EXAM: PORTABLE CHEST 1 VIEW COMPARISON:  04/04/2015 radiographs FINDINGS: The heart size and mediastinal contours are within normal limits. Both lungs are clear. The visualized skeletal structures are unremarkable. IMPRESSION: No active disease. Electronically Signed   By: 06/04/2015 M.D.   On: 05/01/2020 21:39  US Abdomen Limited RUQ (LIVER/GB)  Result Date: 05/01/2020 CLINICAL DATA:  Abdominal pain. EXAM: ULTRASOUND ABDOMEN LIMITED RIGHT UPPER QUADRANT COMPARISON:  None. FINDINGS: Gallbladder: No gallstones or wall thickening visualized. No sonographic Murphy sign noted by sonographer. Common bile duct: Diameter: 16 mm. Liver: No focal lesion identified. Increased parenchymal echogenicity. Portal vein is patent on color Doppler imaging with normal direction of blood flow towards the liver. Other: None. IMPRESSION: 1. Dilated common bile duct measuring up to 12 mm. Correlate with liver function tests. Consider MRCP for further evaluation if clinically indicated. 2. Hepatic steatosis. Please note limited evaluation for focal hepatic masses in a patient with hepatic steatosis due to decreased penetration of the acoustic ultrasound waves. Electronically Signed   By: Tish FredericksonMorgane  Naveau M.D.   On: 05/01/2020 22:25    Procedures Procedures   Medications Ordered in ED Medications  morphine 4 MG/ML injection 4 mg (4 mg Intravenous Given 05/01/20 2137)  ondansetron (ZOFRAN) injection 4 mg (4 mg Intravenous Given 05/01/20 2137)  sodium chloride 0.9 % bolus 1,000 mL (0 mLs Intravenous Stopped 05/01/20 2303)  potassium chloride SA (KLOR-CON) CR tablet 40  mEq (40 mEq Oral Given 05/01/20 2137)    ED Course  I have reviewed the triage vital signs and the nursing notes.  Pertinent labs & imaging results that were available during my care of the patient were reviewed by me and considered in my medical decision making (see chart for details).    MDM Rules/Calculators/A&P                          Debbe OdeaCarla R Kirks is a 55 y.o. female with a past medical history significant for HIV, hypertension, alcoholism, hyperlipidemia who presents with nausea, vomiting, diarrhea, abdominal pain.  Patient reports that for the last 2 weeks, she has had near constant abdominal aching with nausea and vomiting and some loose diarrhea with some mucousy type diarrhea.  She denies any vaginal bleeding or vaginal discharge.  She denies any trauma.  She denies any fevers he does report some chills.  She reports she was recently exposed to her daughter last month who had Covid.  She does report a dry cough but no chest pain or shortness of breath.  No palpitations.  No syncope.  She reports the pain is mild to moderate and is across her upper abdomen.  She still has her gallbladder and has never had abdominal surgery.  On exam, lungs clear and chest nontender.  Abdomen is tender in the upper abdomen in the epigastrium primarily.  Bowel sounds appreciated.  No flank or back tenderness.  No focal neurologic deficits.  Patient resting comfortably otherwise.  Patient had screening labs in triage and while awaiting assessment.  Her labs show elevated LFTs.  This could be related to drinking although she reports she has not had increase in her drinking habits.  I am more concerned about ruling out a acute cholecystitis given the abdominal tenderness in the upper abdomen and the LFT elevation.  We will get right upper quadrant ultrasound and Covid swab potentially preoperatively.  Urinalysis does not show infection.  No leukocytosis.  Lipase not elevated.  Patient does appear somewhat  dehydrated and will give fluids, pain medicine, nausea medicine.  Will give some oral potassium.  If ultrasound completely reassuring and she is still tender, will consider CT scan to look for diverticulitis or colitis or other abnormality.  Anticipate reassessment after work-up.  Ultrasound showed  no evidence of gallbladder wall thickening or stones but did show dilated common bile duct.  MRCP was recommended by radiology to further evaluate if LFTs are elevated which they are.  I reassessed patient she is still having nausea and abdominal pain.  Given this abnormality, will call to patient to medicine service where the GI team can see her in the morning and discuss further imaging with MRCP for her upper abdominal pain with nausea, vomiting, LFT elevation, and, bile duct dilation.   Final Clinical Impression(s) / ED Diagnoses Final diagnoses:  RUQ abdominal pain    Rx / DC Orders ED Discharge Orders    None      Clinical Impression: 1. RUQ abdominal pain     Disposition: Admit  This note was prepared with assistance of Dragon voice recognition software. Occasional wrong-word or sound-a-like substitutions may have occurred due to the inherent limitations of voice recognition software.     Ledarrius Beauchaine, Canary Brim, MD 05/02/20 [phone]

## 2020-05-01 NOTE — ED Triage Notes (Signed)
Pt c/o abd pain, n/v/d x 2-3 weeks-noticed blood in stool x today-NAD-steady gait

## 2020-05-02 ENCOUNTER — Encounter (HOSPITAL_COMMUNITY): Payer: Self-pay | Admitting: Family Medicine

## 2020-05-02 ENCOUNTER — Observation Stay (HOSPITAL_COMMUNITY): Payer: Self-pay

## 2020-05-02 DIAGNOSIS — E871 Hypo-osmolality and hyponatremia: Secondary | ICD-10-CM

## 2020-05-02 DIAGNOSIS — R7401 Elevation of levels of liver transaminase levels: Secondary | ICD-10-CM | POA: Diagnosis present

## 2020-05-02 DIAGNOSIS — R112 Nausea with vomiting, unspecified: Secondary | ICD-10-CM

## 2020-05-02 DIAGNOSIS — R1011 Right upper quadrant pain: Secondary | ICD-10-CM

## 2020-05-02 DIAGNOSIS — E876 Hypokalemia: Secondary | ICD-10-CM

## 2020-05-02 LAB — CBC
HCT: 37 % (ref 36.0–46.0)
Hemoglobin: 12.9 g/dL (ref 12.0–15.0)
MCH: 30.2 pg (ref 26.0–34.0)
MCHC: 34.9 g/dL (ref 30.0–36.0)
MCV: 86.7 fL (ref 80.0–100.0)
Platelets: 255 10*3/uL (ref 150–400)
RBC: 4.27 MIL/uL (ref 3.87–5.11)
RDW: 13.5 % (ref 11.5–15.5)
WBC: 2.6 10*3/uL — ABNORMAL LOW (ref 4.0–10.5)
nRBC: 0 % (ref 0.0–0.2)

## 2020-05-02 LAB — COMPREHENSIVE METABOLIC PANEL
ALT: 116 U/L — ABNORMAL HIGH (ref 0–44)
AST: 130 U/L — ABNORMAL HIGH (ref 15–41)
Albumin: 3.8 g/dL (ref 3.5–5.0)
Alkaline Phosphatase: 97 U/L (ref 38–126)
Anion gap: 10 (ref 5–15)
BUN: 13 mg/dL (ref 6–20)
CO2: 24 mmol/L (ref 22–32)
Calcium: 8.4 mg/dL — ABNORMAL LOW (ref 8.9–10.3)
Chloride: 100 mmol/L (ref 98–111)
Creatinine, Ser: 0.91 mg/dL (ref 0.44–1.00)
GFR, Estimated: >60 mL/min (ref >60)
Glucose, Bld: 111 mg/dL — ABNORMAL HIGH (ref 70–99)
Potassium: 3.9 mmol/L (ref 3.5–5.1)
Sodium: 134 mmol/L — ABNORMAL LOW (ref 135–145)
Total Bilirubin: 0.7 mg/dL (ref 0.3–1.2)
Total Protein: 7.1 g/dL (ref 6.5–8.1)

## 2020-05-02 MED ORDER — ONDANSETRON HCL 4 MG/2ML IJ SOLN: 4.0000 mg | Freq: Four times a day (QID) | INTRAMUSCULAR | Status: DC | PRN

## 2020-05-02 MED ORDER — AMLODIPINE BESYLATE 5 MG PO TABS
5.0000 mg | ORAL_TABLET | Freq: Every day | ORAL | Status: DC
  Administered 2020-05-02: 5 mg via ORAL
  Filled 2020-05-02: qty 1

## 2020-05-02 MED ORDER — GADOBUTROL 1 MMOL/ML IV SOLN
7.0000 mL | Freq: Once | INTRAVENOUS | Status: AC | PRN
  Administered 2020-05-02: 7 mL via INTRAVENOUS

## 2020-05-02 MED ORDER — THIAMINE HCL 100 MG/ML IJ SOLN
100.0000 mg | Freq: Every day | INTRAMUSCULAR | Status: DC
  Administered 2020-05-03: 100 mg via INTRAVENOUS
  Filled 2020-05-02: qty 2

## 2020-05-02 MED ORDER — ONDANSETRON HCL 4 MG PO TABS: 4.0000 mg | ORAL_TABLET | Freq: Four times a day (QID) | ORAL | Status: DC | PRN

## 2020-05-02 MED ORDER — FOLIC ACID 1 MG PO TABS: 1.0000 mg | ORAL_TABLET | Freq: Every day | ORAL | Status: DC

## 2020-05-02 MED ORDER — HYDROCHLOROTHIAZIDE 25 MG PO TABS
25.0000 mg | ORAL_TABLET | Freq: Every day | ORAL | Status: DC
Start: 2020-05-02 — End: 2020-05-03
  Administered 2020-05-02: 25 mg via ORAL
  Filled 2020-05-02: qty 1

## 2020-05-02 MED ORDER — HYDRALAZINE HCL 20 MG/ML IJ SOLN
10.0000 mg | Freq: Four times a day (QID) | INTRAMUSCULAR | Status: DC | PRN
  Administered 2020-05-02: 10 mg via INTRAVENOUS
  Filled 2020-05-02: qty 1

## 2020-05-02 MED ORDER — CLONIDINE HCL 0.1 MG PO TABS
0.1000 mg | ORAL_TABLET | Freq: Once | ORAL | Status: AC
  Administered 2020-05-02: 0.1 mg via ORAL
  Filled 2020-05-02: qty 1

## 2020-05-02 MED ORDER — EMTRICITAB-RILPIVIR-TENOFOV AF 200-25-25 MG PO TABS
1.0000 | ORAL_TABLET | Freq: Every day | ORAL | Status: DC
  Administered 2020-05-03 – 2020-05-04 (×2): 1 via ORAL
  Filled 2020-05-02 (×3): qty 1

## 2020-05-02 MED ORDER — ADULT MULTIVITAMIN W/MINERALS CH: 1.0000 | ORAL_TABLET | Freq: Every day | ORAL | Status: DC

## 2020-05-02 MED ORDER — OXYCODONE HCL 5 MG PO TABS: 5.0000 mg | ORAL_TABLET | ORAL | Status: DC | PRN

## 2020-05-02 MED ORDER — THIAMINE HCL 100 MG/ML IJ SOLN: 100.0000 mg | Freq: Every day | INTRAMUSCULAR | Status: DC

## 2020-05-02 MED ORDER — HYDROMORPHONE HCL 1 MG/ML IJ SOLN: 0.5000 mg | INTRAMUSCULAR | Status: DC | PRN

## 2020-05-02 MED ORDER — LACTATED RINGERS IV SOLN: INTRAVENOUS | Status: DC

## 2020-05-02 MED ORDER — LORAZEPAM 1 MG PO TABS: 1.0000 mg | ORAL_TABLET | ORAL | Status: DC | PRN

## 2020-05-02 MED ORDER — ONDANSETRON HCL 4 MG/2ML IJ SOLN
4.0000 mg | Freq: Once | INTRAMUSCULAR | Status: AC
  Administered 2020-05-02: 4 mg via INTRAVENOUS

## 2020-05-02 MED ORDER — FOLIC ACID 1 MG PO TABS
1.0000 mg | ORAL_TABLET | Freq: Every day | ORAL | Status: DC
  Administered 2020-05-03: 1 mg via ORAL
  Filled 2020-05-02 (×2): qty 1

## 2020-05-02 MED ORDER — ADULT MULTIVITAMIN W/MINERALS CH
1.0000 | ORAL_TABLET | Freq: Every day | ORAL | Status: DC
  Administered 2020-05-03 – 2020-05-04 (×2): 1 via ORAL
  Filled 2020-05-02 (×2): qty 1

## 2020-05-02 MED ORDER — ONDANSETRON HCL 4 MG/2ML IJ SOLN
INTRAMUSCULAR | Status: AC
  Filled 2020-05-02: qty 2

## 2020-05-02 MED ORDER — HYDROMORPHONE HCL 1 MG/ML IJ SOLN: 1.0000 mg | INTRAMUSCULAR | Status: DC | PRN

## 2020-05-02 MED ORDER — LORAZEPAM 2 MG/ML IJ SOLN: 1.0000 mg | INTRAMUSCULAR | Status: DC | PRN

## 2020-05-02 MED ORDER — PRAVASTATIN SODIUM 20 MG PO TABS
20.0000 mg | ORAL_TABLET | Freq: Every day | ORAL | Status: DC
  Filled 2020-05-02: qty 1

## 2020-05-02 MED ORDER — IBUPROFEN 200 MG PO TABS: 400.0000 mg | ORAL_TABLET | Freq: Four times a day (QID) | ORAL | Status: DC | PRN

## 2020-05-02 MED ORDER — THIAMINE HCL 100 MG PO TABS
100.0000 mg | ORAL_TABLET | Freq: Every day | ORAL | Status: DC
  Administered 2020-05-04: 100 mg via ORAL
  Filled 2020-05-02: qty 1

## 2020-05-02 MED ORDER — THIAMINE HCL 100 MG PO TABS: 100.0000 mg | ORAL_TABLET | Freq: Every day | ORAL | Status: DC

## 2020-05-02 NOTE — TOC Initial Note (Signed)
Transition of Care [facility]) - Initial/Assessment Note    Patient Details  Name: Julia Molina MRN: [id number] Date of Birth: September 18, 1965  Transition of Care [facility]) CM/SW Contact:    Ida Rogue, LCSW Phone Number: 05/02/2020, 2:58 PM  Clinical Narrative:    Patient seen in follow up to MD consult for alcohol use.  I found Ms Ebert to be well aware of the gravity of her situation, and attributes drinking to her current hospitalization.  She states she has been in inpatient rehab in the past, both at [facility] for 28 days and also at [facility] in [facility] for 3 years.  She is not interested in returning to [facility] as she has her own place in [place] and is not interested in a month long program.  However, she is planning on contacting [facility] upon d/c, which is a 14 day program in [place]. She says they already have her information, and are just awaiting the outcome of her hospitalization. Whether she goes to that program or not, she plans on re-enrolling in the [facility] outpatient program to get support in her sobriety.  She states she has too much to live for, including twin daughters and grandchildren, for her to throw it all away on alcohol.  No further needs identified. TOC will continue to follow during the course of hospitalization.               Expected Discharge Plan: Home/Self Care Barriers to Discharge: No Barriers Identified   Patient Goals and CMS Choice Patient states their goals for this hospitalization and ongoing recovery are:: "I know I need to get back on the wagon."      Expected Discharge Plan and Services Expected Discharge Plan: Home/Self Care In-house Referral: Clinical Social Work     Living arrangements for the past 2 months: Apartment                                      Prior Living Arrangements/Services Living arrangements for the past 2 months: Apartment Lives with:: Self Patient language and need for interpreter reviewed:: Yes        Need for  Family Participation in Patient Care: Yes (Comment) Care giver support system in place?: Yes (comment)   Criminal Activity/Legal Involvement Pertinent to Current Situation/Hospitalization: No - Comment as needed  Activities of Daily Living Home Assistive Devices/Equipment: Eyeglasses ADL Screening (condition at time of admission) Patient's cognitive ability adequate to safely complete daily activities?: Yes Is the patient deaf or have difficulty hearing?: No Does the patient have difficulty seeing, even when wearing glasses/contacts?: No Does the patient have difficulty concentrating, remembering, or making decisions?: No Patient able to express need for assistance with ADLs?: Yes Does the patient have difficulty dressing or bathing?: No Independently performs ADLs?: Yes (appropriate for developmental age) Does the patient have difficulty walking or climbing stairs?: No Weakness of Legs: None Weakness of Arms/Hands: None  Permission Sought/Granted                  Emotional Assessment Appearance:: Appears stated age Attitude/Demeanor/Rapport: Engaged Affect (typically observed): Appropriate Orientation: : Oriented to Self,Oriented to Place,Oriented to  Time,Oriented to Situation Alcohol / Substance Use: Alcohol Use Psych Involvement: No (comment)  Admission diagnosis:  RUQ abdominal pain [R10.11] Elevated transaminase level [R74.01] Patient Active Problem List   Diagnosis Date Noted  . Elevated transaminase level 05/02/2020  .  RUQ abdominal pain 05/02/2020  . Hypokalemia 05/02/2020  . Hyponatremia 05/02/2020  . Nausea & vomiting 05/02/2020  . Myalgia 06/15/2017  . Joint pain 06/15/2017  . Bilateral hip pain 06/15/2017  . Osteoarthritis 08/05/2016  . Benign essential HTN 10/03/2015  . AIDS (HCC) 02/28/2014  . Major depression, chronic 02/28/2014  . Acute bronchitis 11/22/2012  . Vaginitis and vulvovaginitis 11/22/2012  . Dental abscess 09/08/2012  . Unspecified  vitamin D deficiency 03/25/2012  . Boils 03/24/2012  . Hyperlipidemia 01/07/2012  . Smoker 07/02/2011  . Amenorrhea 03/05/2011  . 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/05/2011  . ELEVATED BLOOD PRESSURE 10/06/2008  . SKIN RASH 05/05/2007  . ANEMIA-IRON DEFICIENCY 11/27/2006  . Human immunodeficiency virus (HIV) disease (HCC) 11/02/2006   PCP:  Daiva Eves, Lisette Grinder, MD Pharmacy:   [facility] [phone] [facility] - [place], [place] - [address] [place] [address] Phone: [phone] Fax: [phone]     Social Determinants of Health (SDOH) Interventions    Readmission Risk Interventions No flowsheet data found.

## 2020-05-02 NOTE — Plan of Care (Signed)
Plan of care discussed.   

## 2020-05-02 NOTE — Consult Note (Addendum)
Attending physician's note   I have taken an interval history, reviewed the chart and examined the patient. I agree with the Advanced Practitioner's note, impression, and recommendations as outlined.   55 year old female with medical history as outlined below, admitted with abdominal pain, nausea/vomiting, and separately also endorses 1 day history of BRBPR in the setting of straining to have BM.  Has had a couple of similar episodes of small-volume BRBPR over the last couple of months, again always associated with straining to have BM/constipation.  The abdominal pain and nausea/vomiting are otherwise separate and no prior similar symptoms.  Evaluation notable for the following:  -AST/ALT 164/139, ALP 109, T bili 0.4 -WBC 2.8, otherwise normal CBC (baseline WBC ~4.9) -Normal lipase, normal renal function -RUQ [place]: 12 mm CBD without clear CDL.  Hepatic steatosis  -Liver enzymes downtrending today, currently 130/116 and ALP/T bili still normal.  WBC 2.6.  1) Abdominal pain 2) Elevated liver enzymes -Continue trending liver enzymes -Acute viral hepatitis panel -MRCP  3) Rectal bleeding -Will eventually need colonoscopy for further evaluation (no previous colonoscopy) -Otherwise normal H/H and hemodynamically stable, so do not feel that urgent inpatient colonoscopy needed at this juncture, but can certainly reassess should her symptomatology change  4) History of HIV 5) Leukopenia -Follows with ID as outpatient   Gerrit Heck, DO, [place] [phone] office            Referring Provider:  EDP Primary Care Physician:  Tommy Medal, Lavell Islam, MD Primary Gastroenterologist:  Althia Forts  Reason for Consultation:  Abdominal pain and elevated LFTs  HPI: Julia Molina is a 55 y.o. female with past medical history significant for well-controlled HIV, hypertension, hyperlipidemia who presented to [facility] for evaluation regarding abdominal pain/discomfort with nausea and  vomiting as well as rectal bleeding.  She says that she failed to report the rectal bleeding in the emergency department.  She says that that is what prompted her to finally come in for evaluation.  She says that for the past 2-week she has had a poor appetite with discomfort in her upper abdomen and nausea with episodes of vomiting.  Says that yesterday she was straining to have a bowel movement and passed some red blood in the toilet bowl.  that scared her and so she came to the ED.  She has never had colonoscopy or any other GI evaluation in the past.  Upon evaluation in the emergency department her hemoglobin was normal.  She had hyponatremia with sodium of 129 and hypokalemia at 3.1.  These have both been corrected.  AST was elevated at 164 down to 130 today.  ALT elevated at 139 down to 116 today.  Alk phos and total bili are normal.  Lipase is normal.  Ultrasound showed the following:  IMPRESSION: 1. Dilated common bile duct measuring up to 12 mm. Correlate with liver function tests. Consider MRCP for further evaluation if clinically indicated. 2. Hepatic steatosis. Please note limited evaluation for focal hepatic masses in a patient with hepatic steatosis due to decreased penetration of the acoustic ultrasound waves.  Past Medical History:  Diagnosis Date  . Benign essential HTN 10/03/2015  . Bilateral hip pain 06/15/2017  . High cholesterol   . HIV (human immunodeficiency virus infection) ([place])   . Joint pain 06/15/2017  . Myalgia 06/15/2017  . Osteoarthritis 08/05/2016    Past Surgical History:  Procedure Laterality Date  . CESAREAN SECTION      Prior to Admission medications  Medication Sig Start Date End Date Taking? Authorizing Provider  aspirin-sod bicarb-citric acid (ALKA-SELTZER) 325 MG TBEF tablet Take 325 mg by mouth once.   Yes [provider]  emtricitabine-rilpivir-tenofovir AF (ODEFSEY) 200-25-25 MG TABS tablet Take 1 tablet by mouth daily with breakfast.  12/07/19  Yes Tommy Medal, Lavell Islam, MD  hydrochlorothiazide (HYDRODIURIL) 25 MG tablet Take 1 tablet (25 mg total) by mouth daily. 12/07/19  Yes Tommy Medal, Lavell Islam, MD  pravastatin (PRAVACHOL) 20 MG tablet Take 1 tablet (20 mg total) by mouth daily. 12/07/19  Yes Truman Hayward, MD    Current Facility-Administered Medications  Medication Dose Route Frequency Provider Last Rate Last Admin  . emtricitabine-rilpivir-tenofovir AF (ODEFSEY) 200-25-25 MG per tablet 1 tablet  1 tablet Oral Q breakfast Chotiner, Yevonne Aline, MD      . hydrochlorothiazide (HYDRODIURIL) tablet 25 mg  25 mg Oral Daily Chotiner, Yevonne Aline, MD   25 mg at 05/02/20 1103  . HYDROmorphone (DILAUDID) injection 1 mg  1 mg Intravenous Q3H PRN Chotiner, Yevonne Aline, MD      . ibuprofen (ADVIL) tablet 400 mg  400 mg Oral Q6H PRN Chotiner, Yevonne Aline, MD      . lactated ringers infusion   Intravenous Continuous Chotiner, Yevonne Aline, MD 125 mL/hr at 05/02/20 0614 New Bag at 05/02/20 0981  . ondansetron (ZOFRAN) tablet 4 mg  4 mg Oral Q6H PRN Chotiner, Yevonne Aline, MD       Or  . ondansetron [facility]) injection 4 mg  4 mg Intravenous Q6H PRN Chotiner, Yevonne Aline, MD      . pravastatin (PRAVACHOL) tablet 20 mg  20 mg Oral Daily Chotiner, Yevonne Aline, MD        Allergies as of 05/01/2020  . (No Known Allergies)    History reviewed. No pertinent family history.  Social History   Socioeconomic History  . Marital status: Single    Spouse name: Not on file  . Number of children: Not on file  . Years of education: Not on file  . Highest education level: Not on file  Occupational History  . Not on file  Tobacco Use  . Smoking status: Current Every Day Smoker    Packs/day: 0.20    Types: Cigars    Start date: 12/06/1989  . Smokeless tobacco: Never Used  Vaping Use  . Vaping Use: Never used  Substance and Sexual Activity  . Alcohol use: No  . Drug use: Not Currently    Types: Marijuana  . Sexual activity: Not on file  Other  Topics Concern  . Not on file  Social History Narrative  . Not on file   Social Determinants of Health   Financial Resource Strain: Not on file  Food Insecurity: Not on file  Transportation Needs: Not on file  Physical Activity: Not on file  Stress: Not on file  Social Connections: Not on file  Intimate Partner Violence: Not on file    Review of Systems: ROS is O/W negative except as mentioned in HPI.  Physical Exam: Vital signs in last 24 hours: Temp:  [98.6 F (37 C)-99.2 F (37.3 C)] 98.6 F (37 C) (03/30 1040) Pulse Rate:  [82-100] 88 (03/30 1040) Resp:  [16-18] 18 (03/30 1040) BP: (102-171)/(77-116) 171/116 (03/30 1040) SpO2:  [95 %-100 %] 100 % (03/30 1040) Weight:  [63 kg-63.2 kg] 63.2 kg (03/30 0236) Last BM Date: 05/01/20 General:  Alert, Well-developed, well-nourished, pleasant and cooperative in NAD Head:  Normocephalic and  atraumatic. Eyes:  Sclera clear, no icterus.  Conjunctiva pink. Ears:  Normal auditory acuity. Mouth:  No deformity or lesions.   Lungs:  Clear throughout to auscultation.  No wheezes, crackles, or rhonchi.  Heart:  Regular rate and rhythm; no murmurs, clicks, rubs, or gallops. Abdomen:  Soft, non-distended.  BS present.  Mild upper abdominal TTP.  Msk:  Symmetrical without gross deformities. Pulses:  Normal pulses noted. Extremities:  Without clubbing or edema. Neurologic:  Alert and oriented x 4;  grossly normal neurologically. Skin:  Intact without significant lesions or rashes. Psych:  Alert and cooperative. Normal mood and affect.  Intake/Output from previous day: 03/29 0701 - 03/30 0700 In: 1000 [IV Piggyback:1000] Out: -   Lab Results: Recent Labs    05/01/20 2044 05/02/20 0814  WBC 2.8* 2.6*  HGB 14.6 12.9  HCT 40.2 37.0  PLT 289 255   BMET Recent Labs    05/01/20 2044 05/02/20 0814  NA 129* 134*  K 3.1* 3.9  CL 91* 100  CO2 26 24  GLUCOSE 120* 111*  BUN 11 13  CREATININE 0.72 0.91  CALCIUM 9.0 8.4*    LFT Recent Labs    05/02/20 0814  PROT 7.1  ALBUMIN 3.8  AST 130*  ALT 116*  ALKPHOS 97  BILITOT 0.7   Studies/Results: DG Chest Portable 1 View  Result Date: 05/01/2020 CLINICAL DATA:  Cough and chills. Abdominal pain with nausea and vomiting. EXAM: PORTABLE CHEST 1 VIEW COMPARISON:  04/04/2015 radiographs FINDINGS: The heart size and mediastinal contours are within normal limits. Both lungs are clear. The visualized skeletal structures are unremarkable. IMPRESSION: No active disease. Electronically Signed   By: Van Clines M.D.   On: 05/01/2020 21:39   US Abdomen Limited RUQ (LIVER/GB)  Result Date: 05/01/2020 CLINICAL DATA:  Abdominal pain. EXAM: ULTRASOUND ABDOMEN LIMITED RIGHT UPPER QUADRANT COMPARISON:  None. FINDINGS: Gallbladder: No gallstones or wall thickening visualized. No sonographic Murphy sign noted by sonographer. Common bile duct: Diameter: 16 mm. Liver: No focal lesion identified. Increased parenchymal echogenicity. Portal vein is patent on color Doppler imaging with normal direction of blood flow towards the liver. Other: None. IMPRESSION: 1. Dilated common bile duct measuring up to 12 mm. Correlate with liver function tests. Consider MRCP for further evaluation if clinically indicated. 2. Hepatic steatosis. Please note limited evaluation for focal hepatic masses in a patient with hepatic steatosis due to decreased penetration of the acoustic ultrasound waves. Electronically Signed   By: Iven Finn M.D.   On: 05/01/2020 22:25   IMPRESSION:  *2 weeks of upper abdominal discomfort with episodes of nausea and vomiting, poor appetite.  AST and ALT elevated and ultrasound showed dilated common bile duct.  MRCP has been ordered. *Rectal bleeding: Sounds small-volume on 2 occasions with straining to have a bowel movement.  Has never had colonoscopy in the past. *HIV: Well-controlled.  Follows with infectious disease regularly.   PLAN: -MRCP has been ordered.   Await results. -We will check acute viral hepatitis panel. -Trend LFTs. -She will need colonoscopy, likely as an outpatient in the near future.  Would just monitor her bleeding while she is here.   Laban Emperor. Zehr  05/02/2020, 11:10 AM

## 2020-05-02 NOTE — H&P (Signed)
History and Physical    [name]:[id number] DOB: 03-Feb-1966 DOA: 05/01/2020  PCP: Randall Hiss, MD   Patient coming from:  Home  Chief Complaint: Abdominal pain, nausea and vomiting.  HPI: Julia Molina is a 55 y.o. female with medical history significant for HTN, HLD, OA, HIV who presents for evaluation of abdominal pain with nausea and vomiting.  Reports she has been having abdominal pain for the last 2 weeks.  Reports pain is in the epigastric and right upper quadrant region.  She describes as an aching gnawing pain that does not radiate.  Is in moderate to severe pain that fluctuates.  She has not noticed anything that relieves the pain or week.  She has had intermittent chills and increase in belching.  She has some diarrhea.  She has been having nausea and vomiting over the last week.  She states that she has fatigue and just feeling rundown.  Abdominal trauma or injury.  She denies any alcohol use.  She denies chest pain, cough, shortness of breath, dysuria, fever, syncope.  She denies any recent travel.  She denies any change in her diet and denies any raw or undercooked meat ingestion.  She denies any change in her medications recently.  ED Course: In the emergency room she is found to have elevated LFT levels and a common bile duct dilated on ultrasound.  She also has hyponatremia and hypokalemia.  She was given antiemetics and IV fluids in the emergency room.  Hospitalist service is asked to evaluate and manage patient  Review of Systems:  General: Reports fatigue and chills. Denies fever, night sweats.  Denies dizziness. Reports decreased appetite. HENT: Denies head trauma, headache, denies change in hearing, tinnitus.  Denies nasal  bleeding.  Denies sore throat, sores in mouth.  Denies difficulty swallowing Eyes: Denies blurry vision, pain in eye, drainage.  Denies discoloration of eyes. Neck: Denies pain.  Denies swelling.  Denies pain with  movement. Cardiovascular: Denies chest pain, palpitations.  Denies edema.  Denies orthopnea Respiratory: Denies shortness of breath, cough.  Denies wheezing.  Denies sputum production Gastrointestinal: Reports abdominal pain with nausea, vomiting. Reports diarrhea.  Denies melena.  Denies hematemesis. Musculoskeletal: Denies limitation of movement.  Denies deformity or swelling.  Denies arthralgias Genitourinary: Denies pelvic pain.  Denies urinary frequency or hesitancy.  Denies dysuria.  Skin: Denies rash.  Denies petechiae, purpura, ecchymosis. Neurological: Denies headache.  Denies syncope. Denies seizure activity. Denies paresthesia. Denies slurred speech, drooping face.  Denies visual change. Psychiatric: Denies depression, anxiety. Denies hallucinations.  Past Medical History:  Diagnosis Date  . Benign essential HTN 10/03/2015  . Bilateral hip pain 06/15/2017  . High cholesterol   . HIV (human immunodeficiency virus infection) (HCC)   . Joint pain 06/15/2017  . Myalgia 06/15/2017  . Osteoarthritis 08/05/2016    Past Surgical History:  Procedure Laterality Date  . CESAREAN SECTION      Social History  reports that she has been smoking cigars. She started smoking about 30 years ago. She has been smoking about 0.20 packs per day. She has never used smokeless tobacco. She reports previous drug use. Drug: Marijuana. She reports that she does not drink alcohol.  No Known Allergies  History reviewed. No pertinent family history.   Prior to Admission medications   Medication Sig Start Date End Date Taking? Authorizing Provider  aspirin-sod bicarb-citric acid (ALKA-SELTZER) 325 MG TBEF tablet Take 325 mg by mouth once.   Yes [provider]  emtricitabine-rilpivir-tenofovir AF (ODEFSEY) 200-25-25 MG TABS tablet Take 1 tablet by mouth daily with breakfast. 12/07/19  Yes Daiva Eves, Lisette Grinder, MD  hydrochlorothiazide (HYDRODIURIL) 25 MG tablet Take 1 tablet (25 mg total) by mouth  daily. 12/07/19  Yes Daiva Eves, Lisette Grinder, MD  pravastatin (PRAVACHOL) 20 MG tablet Take 1 tablet (20 mg total) by mouth daily. 12/07/19  Yes Randall Hiss, MD    Physical Exam: Vitals:   05/01/20 2300 05/02/20 0100 05/02/20 0140 05/02/20 0236  BP: (!) 139/103 102/77 (!) 130/98 (!) 140/96  Pulse: 86  100 86  Resp: 16  17 17   Temp:    99.2 F (37.3 C)  TempSrc:    Oral  SpO2: 95%  100% 100%  Weight:    63.2 kg  Height:    5\' 6"  (1.676 m)    Constitutional: NAD, calm, comfortable Vitals:   05/01/20 2300 05/02/20 0100 05/02/20 0140 05/02/20 0236  BP: (!) 139/103 102/77 (!) 130/98 (!) 140/96  Pulse: 86  100 86  Resp: 16  17 17   Temp:    99.2 F (37.3 C)  TempSrc:    Oral  SpO2: 95%  100% 100%  Weight:    63.2 kg  Height:    5\' 6"  (1.676 m)   General: WDWN, Alert and oriented x3.  Eyes: EOMI, PERRL, conjunctivae normal.  Sclera nonicteric HENT:  [place]/AT, external ears normal.  Nares patent without epistasis.  Mucous membranes are dry Neck: Soft, normal range of motion, supple, no masses, Trachea midline Respiratory: clear to auscultation bilaterally, no wheezing, no crackles. Normal respiratory effort. No accessory muscle use.  Cardiovascular: Regular rate and rhythm, no murmurs / rubs / gallops. No extremity edema. Abdomen: Soft, Has RUQ and epigastric tenderness, nondistended, no rebound or guarding.  No masses palpated. Bowel sounds normoactive Musculoskeletal: FROM. no cyanosis. No joint deformity upper and lower extremities. Normal muscle tone.  Skin: Warm, dry, intact no rashes, lesions, ulcers. No induration Neurologic: CN 2-12 grossly intact.  Normal speech. Sensation intact, Strength 5/5 in all extremities.   Psychiatric: Normal judgment and insight.  Normal mood.    Labs on Admission: I have personally reviewed following labs and imaging studies  CBC: Recent Labs  Lab 05/01/20 2044  WBC 2.8*  HGB 14.6  HCT 40.2  MCV 83.2  PLT 289    Basic Metabolic  Panel: Recent Labs  Lab 05/01/20 2044  NA 129*  K 3.1*  CL 91*  CO2 26  GLUCOSE 120*  BUN 11  CREATININE 0.72  CALCIUM 9.0    GFR: Estimated Creatinine Clearance: 75.3 mL/min (by C-G formula based on SCr of 0.72 mg/dL).  Liver Function Tests: Recent Labs  Lab 05/01/20 2044  AST 164*  ALT 139*  ALKPHOS 109  BILITOT 0.4  PROT 8.1  ALBUMIN 4.2    Urine analysis:    Component Value Date/Time   COLORURINE YELLOW 05/01/2020 2038   APPEARANCEUR CLEAR 05/01/2020 2038   LABSPEC <1.005 (L) 05/01/2020 2038   PHURINE 7.5 05/01/2020 2038   GLUCOSEU NEGATIVE 05/01/2020 2038   GLUCOSEU NEG mg/dL 05/03/2020 2039   HGBUR NEGATIVE 05/01/2020 2038   BILIRUBINUR NEGATIVE 05/01/2020 2038   KETONESUR NEGATIVE 05/01/2020 2038   PROTEINUR NEGATIVE 05/01/2020 2038   UROBILINOGEN 0.2 02/19/2011 0916   NITRITE NEGATIVE 05/01/2020 2038   LEUKOCYTESUR NEGATIVE 05/01/2020 2038    Radiological Exams on Admission: DG Chest Portable 1 View  Result Date: 05/01/2020 CLINICAL DATA:  Cough and chills. Abdominal pain  with nausea and vomiting. EXAM: PORTABLE CHEST 1 VIEW COMPARISON:  04/04/2015 radiographs FINDINGS: The heart size and mediastinal contours are within normal limits. Both lungs are clear. The visualized skeletal structures are unremarkable. IMPRESSION: No active disease. Electronically Signed   By: Gaylyn Rong M.D.   On: 05/01/2020 21:39   US Abdomen Limited RUQ (LIVER/GB)  Result Date: 05/01/2020 CLINICAL DATA:  Abdominal pain. EXAM: ULTRASOUND ABDOMEN LIMITED RIGHT UPPER QUADRANT COMPARISON:  None. FINDINGS: Gallbladder: No gallstones or wall thickening visualized. No sonographic Murphy sign noted by sonographer. Common bile duct: Diameter: 16 mm. Liver: No focal lesion identified. Increased parenchymal echogenicity. Portal vein is patent on color Doppler imaging with normal direction of blood flow towards the liver. Other: None. IMPRESSION: 1. Dilated common bile duct measuring  up to 12 mm. Correlate with liver function tests. Consider MRCP for further evaluation if clinically indicated. 2. Hepatic steatosis. Please note limited evaluation for focal hepatic masses in a patient with hepatic steatosis due to decreased penetration of the acoustic ultrasound waves. Electronically Signed   By: Tish Frederickson M.D.   On: 05/01/2020 22:25    Assessment/Plan Principal Problem:   RUQ abdominal pain Ms. Settle is placed on Med/Surg for observation and further workup. Has RUQ pain and elevated LFTs.  GI consulted to see pt this am.  [place] of GB shows dilated CBD. MRCP ordered for further evaluation.  Pain control provided.  IVF hydration with LR at 125 ml/hr Recheck CBC this am.  Active Problems:   Elevated transaminase level Elevated LFTs. Recheck CMP this am.  GI consulted.     Benign essential HTN Continue HCTZ for BP. Monitor BP.     Hypokalemia Pt given potassium in ER. Check Magnesium level and if low will replace. If potassium still low on repeat labs this am will supplement. Most likely due to vomiting.    Hyponatremia IVF hydration provided. Secondary to volume depletion and vomiting.     Nausea & vomiting Anti-emetics provided. IVF hydration    Human immunodeficiency virus (HIV) disease  Stable. Continue HART     DVT prophylaxis: Padua score low. Early ambulation and TED hose for DVT prophylaxis  Code Status:   Full code  Family Communication:  Diagnosis and plan discussed with patient.  She verbalized understanding agrees with plan.  Further recommendations to follow as clinical indicated Disposition Plan:   Patient is from:  Home  Anticipated DC to:  Home  Anticipated DC date:  Anticipate less than 2 midnight stay  Anticipated DC barriers: No barriers to discharge identified at this time  Consults called:  Gastroenterology, Secure chat sent to Dr. Rhea Belton for on call GI to consult.  Admission status:     Observation.   Claudean Severance Olaf Mesa  MD [facility]  How to contact the [facility] Attending or Consulting provider 7A - 7P or covering provider during after hours 7P -7A, for this patient?   1. Check the care team in [place] and look for a) attending/consulting [facility] provider listed and b) the [facility] team listed 2. Log into [email or web address] and use [facility] universal password to access. If you do not have the password, please contact the hospital operator. 3. Locate the [facility] provider you are looking for under [facility] and page to a number that you can be directly reached. 4. If you still have difficulty reaching the provider, please page the [facility] (Director on Call) for the Hospitalists listed on amion for assistance.  05/02/2020, 5:44 AM

## 2020-05-02 NOTE — Progress Notes (Signed)
[facility] PROGRESS NOTE   Julia Molina IWL:[id number] DOB: 10-17-1965 DOA: 05/01/2020  PCP: Daiva Eves, Lisette Grinder, MD  Brief History/Interval Summary: 55 y.o. female with medical history significant for HTN, HLD, OA, HIV who presented with abdominal pain nausea and vomiting.  She was found to have elevated transaminases.  Imaging studies suggested dilated bile ducts.  She was hospitalized for further management.    Consultants: [facility]  Procedures: None yet  Antibiotics: Anti-infectives (From admission, onward)   Start     Dose/Rate Route Frequency Ordered Stop   05/02/20 0800  emtricitabine-rilpivir-tenofovir AF (ODEFSEY) 200-25-25 MG per tablet 1 tablet        1 tablet Oral Daily with breakfast 05/02/20 0602        Subjective/Interval History: Patient complains of upper abdominal pain.  Some nausea but no vomiting this morning.  She is also had 2 episodes of small amount of blood in her stool overnight.  She also had another episode about 2 months ago.  Denies any black-colored stool.     Assessment/Plan:  Transaminitis Noted to have elevated AST and ALT.  Imaging study showed dilated bile ducts.  MRCP has been ordered.  Gastroenterology has been consulted.  Patient also mentions history of alcohol use and consumes beer and coolers on a daily basis.  Last intake was yesterday. No recent hepatitis panel results in our system.  We will order this.  Hematochezia Minor episodes.  Hemoglobin is stable.  Will need colonoscopy eventually.  Essential hypertension Blood pressure seems to be poorly controlled.  Hydrochlorothiazide is being continued.  May need to add additional agents.  Hypokalemia This has been repleted.  Hyponatremia Likely due to hypovolemia.  Also has improved.  Alcohol use disorder Consumed 40 ounces of beer on a daily basis along with 2 of the wine coolers.  Currently does not appear having symptoms suggestive of alcohol withdrawal.   We will place her on CIWA protocol.  Thiamine folic acid.  HIV Followed by infectious disease.  Continue antiretroviral treatment.  DVT Prophylaxis: SCDs Code Status: Full code Family Communication: Discussed with the patient Disposition Plan: Hopefully return home when improved  Status is: Observation  The patient will require care spanning > 2 midnights and should be moved to inpatient because: Ongoing active pain requiring inpatient pain management and Ongoing diagnostic testing needed not appropriate for outpatient work up  Dispo: The patient is from: Home              Anticipated d/c is to: Home              Patient currently is not medically stable to d/c.   Difficult to place patient No    Medications:  Scheduled: . emtricitabine-rilpivir-tenofovir AF  1 tablet Oral Q breakfast  . hydrochlorothiazide  25 mg Oral Daily  . pravastatin  20 mg Oral Daily   Continuous: . lactated ringers 125 mL/hr at 05/02/20 1740   CXK:GYJEHUDJSHFWY (DILAUDID) injection, ibuprofen, ondansetron **OR** ondansetron (ZOFRAN) IV   Objective:  Vital Signs  Vitals:   05/02/20 0236 05/02/20 0636 05/02/20 1040 05/02/20 1146  BP: (!) 140/96 (!) 139/99 (!) 171/116 (!) 178/119  Pulse: 86 89 88 88  Resp: 17 16 18    Temp: 99.2 F (37.3 C) 98.8 F (37.1 C) 98.6 F (37 C)   TempSrc: Oral Oral Oral   SpO2: 100% 97% 100%   Weight: 63.2 kg     Height: 5\' 6"  (1.676 m)  Intake/Output Summary (Last 24 hours) at 05/02/2020 1236 Last data filed at 05/02/2020 0600 Gross per 24 hour  Intake 1000 ml  Output --  Net 1000 ml   Filed Weights   05/01/20 1940 05/02/20 0236  Weight: 63 kg 63.2 kg    General appearance: Awake alert.  In no distress Resp: Clear to auscultation bilaterally.  Normal effort Cardio: S1-S2 is normal regular.  No S3-S4.  No rubs murmurs or bruit GI: Abdomen is soft.  Tender in the epigastric area without any rebound rigidity or guarding.  No masses organomegaly.   Bowel sounds present normal  Extremities: No edema.  Full range of motion of lower extremities. Neurologic: Alert and oriented x3.  No focal neurological deficits.    Lab Results:  Data Reviewed: I have personally reviewed following labs and imaging studies  CBC: Recent Labs  Lab 05/01/20 2044 05/02/20 0814  WBC 2.8* 2.6*  HGB 14.6 12.9  HCT 40.2 37.0  MCV 83.2 86.7  PLT 289 255    Basic Metabolic Panel: Recent Labs  Lab 05/01/20 2044 05/02/20 0814  NA 129* 134*  K 3.1* 3.9  CL 91* 100  CO2 26 24  GLUCOSE 120* 111*  BUN 11 13  CREATININE 0.72 0.91  CALCIUM 9.0 8.4*    GFR: Estimated Creatinine Clearance: 66.2 mL/min (by C-G formula based on SCr of 0.91 mg/dL).  Liver Function Tests: Recent Labs  Lab 05/01/20 2044 05/02/20 0814  AST 164* 130*  ALT 139* 116*  ALKPHOS 109 97  BILITOT 0.4 0.7  PROT 8.1 7.1  ALBUMIN 4.2 3.8    Recent Labs  Lab 05/01/20 2044  LIPASE 35     Recent Results (from the past 240 hour(s))  Resp Panel by RT-PCR (Flu A&B, Covid) Nasopharyngeal Swab     Status: None   Collection Time: 05/01/20  9:44 PM   Specimen: Nasopharyngeal Swab; Nasopharyngeal(NP) swabs in vial transport medium  Result Value Ref Range Status   SARS Coronavirus 2 by RT PCR NEGATIVE NEGATIVE Final    Comment: (NOTE) SARS-CoV-2 target nucleic acids are NOT DETECTED.  The SARS-CoV-2 RNA is generally detectable in upper respiratory specimens during the acute phase of infection. The lowest concentration of SARS-CoV-2 viral copies this assay can detect is 138 copies/mL. A negative result does not preclude SARS-Cov-2 infection and should not be used as the sole basis for treatment or other patient management decisions. A negative result may occur with  improper specimen collection/handling, submission of specimen other than nasopharyngeal swab, presence of viral mutation(s) within the areas targeted by this assay, and inadequate number of viral copies(<138  copies/mL). A negative result must be combined with clinical observations, patient history, and epidemiological information. The expected result is Negative.  Fact Sheet for Patients:  BloggerCourse.com  Fact Sheet for Healthcare Providers:  SeriousBroker.it  This test is no t yet approved or cleared by the [place] FDA and  has been authorized for detection and/or diagnosis of SARS-CoV-2 by FDA under an Emergency Use Authorization (EUA). This EUA will remain  in effect (meaning this test can be used) for the duration of the COVID-19 declaration under Section 564(b)(1) of the Act, 21 U.S.C.section 360bbb-3(b)(1), unless the authorization is terminated  or revoked sooner.       Influenza A by PCR NEGATIVE NEGATIVE Final   Influenza B by PCR NEGATIVE NEGATIVE Final    Comment: (NOTE) The Xpert Xpress SARS-CoV-2/FLU/RSV plus assay is intended as an aid in the diagnosis of influenza  from Nasopharyngeal swab specimens and should not be used as a sole basis for treatment. Nasal washings and aspirates are unacceptable for Xpert Xpress SARS-CoV-2/FLU/RSV testing.  Fact Sheet for Patients: BloggerCourse.com  Fact Sheet for Healthcare Providers: SeriousBroker.it  This test is not yet approved or cleared by the [place] FDA and has been authorized for detection and/or diagnosis of SARS-CoV-2 by FDA under an Emergency Use Authorization (EUA). This EUA will remain in effect (meaning this test can be used) for the duration of the COVID-19 declaration under Section 564(b)(1) of the Act, 21 U.S.C. section 360bbb-3(b)(1), unless the authorization is terminated or revoked.  Performed at [facility], [address] [place], [place] [address]       Radiology Studies: DG Chest Portable 1 View  Result Date: 05/01/2020 CLINICAL DATA:  Cough and chills. Abdominal pain  with nausea and vomiting. EXAM: PORTABLE CHEST 1 VIEW COMPARISON:  04/04/2015 radiographs FINDINGS: The heart size and mediastinal contours are within normal limits. Both lungs are clear. The visualized skeletal structures are unremarkable. IMPRESSION: No active disease. Electronically Signed   By: Gaylyn Rong M.D.   On: 05/01/2020 21:39   US Abdomen Limited RUQ (LIVER/GB)  Result Date: 05/01/2020 CLINICAL DATA:  Abdominal pain. EXAM: ULTRASOUND ABDOMEN LIMITED RIGHT UPPER QUADRANT COMPARISON:  None. FINDINGS: Gallbladder: No gallstones or wall thickening visualized. No sonographic Murphy sign noted by sonographer. Common bile duct: Diameter: 16 mm. Liver: No focal lesion identified. Increased parenchymal echogenicity. Portal vein is patent on color Doppler imaging with normal direction of blood flow towards the liver. Other: None. IMPRESSION: 1. Dilated common bile duct measuring up to 12 mm. Correlate with liver function tests. Consider MRCP for further evaluation if clinically indicated. 2. Hepatic steatosis. Please note limited evaluation for focal hepatic masses in a patient with hepatic steatosis due to decreased penetration of the acoustic ultrasound waves. Electronically Signed   By: Tish Frederickson M.D.   On: 05/01/2020 22:25       LOS: 0 days   Jaivion Kingsley Rito Ehrlich  [facility] Pager on [email or web address]  05/02/2020, 12:36 PM

## 2020-05-03 ENCOUNTER — Inpatient Hospital Stay (HOSPITAL_COMMUNITY): Payer: Self-pay

## 2020-05-03 DIAGNOSIS — B20 Human immunodeficiency virus [HIV] disease: Secondary | ICD-10-CM

## 2020-05-03 DIAGNOSIS — R7401 Elevation of levels of liver transaminase levels: Secondary | ICD-10-CM

## 2020-05-03 DIAGNOSIS — E876 Hypokalemia: Secondary | ICD-10-CM

## 2020-05-03 DIAGNOSIS — R1011 Right upper quadrant pain: Secondary | ICD-10-CM

## 2020-05-03 DIAGNOSIS — R1013 Epigastric pain: Secondary | ICD-10-CM

## 2020-05-03 DIAGNOSIS — I10 Essential (primary) hypertension: Secondary | ICD-10-CM

## 2020-05-03 LAB — HEPATITIS PANEL, ACUTE
HCV Ab: NONREACTIVE
Hep A IgM: NONREACTIVE
Hep B C IgM: NONREACTIVE
Hepatitis B Surface Ag: NONREACTIVE

## 2020-05-03 LAB — CBC
HCT: 36.4 % (ref 36.0–46.0)
Hemoglobin: 12.7 g/dL (ref 12.0–15.0)
MCH: 30.1 pg (ref 26.0–34.0)
MCHC: 34.9 g/dL (ref 30.0–36.0)
MCV: 86.3 fL (ref 80.0–100.0)
Platelets: 229 10*3/uL (ref 150–400)
RBC: 4.22 MIL/uL (ref 3.87–5.11)
RDW: 13.4 % (ref 11.5–15.5)
WBC: 2.4 10*3/uL — ABNORMAL LOW (ref 4.0–10.5)
nRBC: 0 % (ref 0.0–0.2)

## 2020-05-03 LAB — COMPREHENSIVE METABOLIC PANEL
ALT: 202 U/L — ABNORMAL HIGH (ref 0–44)
AST: 439 U/L — ABNORMAL HIGH (ref 15–41)
Albumin: 3.9 g/dL (ref 3.5–5.0)
Alkaline Phosphatase: 115 U/L (ref 38–126)
Anion gap: 13 (ref 5–15)
BUN: 8 mg/dL (ref 6–20)
CO2: 20 mmol/L — ABNORMAL LOW (ref 22–32)
Calcium: 8.7 mg/dL — ABNORMAL LOW (ref 8.9–10.3)
Chloride: 96 mmol/L — ABNORMAL LOW (ref 98–111)
Creatinine, Ser: 0.83 mg/dL (ref 0.44–1.00)
GFR, Estimated: >60 mL/min (ref >60)
Glucose, Bld: 124 mg/dL — ABNORMAL HIGH (ref 70–99)
Potassium: 3 mmol/L — ABNORMAL LOW (ref 3.5–5.1)
Sodium: 129 mmol/L — ABNORMAL LOW (ref 135–145)
Total Bilirubin: 0.9 mg/dL (ref 0.3–1.2)
Total Protein: 7.4 g/dL (ref 6.5–8.1)

## 2020-05-03 LAB — MAGNESIUM: Magnesium: 1.5 mg/dL — ABNORMAL LOW (ref 1.7–2.4)

## 2020-05-03 LAB — VITAMIN B12: Vitamin B-12: 735 pg/mL (ref 180–914)

## 2020-05-03 LAB — FOLATE: Folate: 11.8 ng/mL (ref >5.9)

## 2020-05-03 MED ORDER — TECHNETIUM TC 99M MEBROFENIN IV KIT
5.5000 | PACK | Freq: Once | INTRAVENOUS | Status: AC | PRN
  Administered 2020-05-03: 5.5 via INTRAVENOUS

## 2020-05-03 MED ORDER — POTASSIUM CHLORIDE CRYS ER 20 MEQ PO TBCR
40.0000 meq | EXTENDED_RELEASE_TABLET | Freq: Two times a day (BID) | ORAL | Status: AC
  Administered 2020-05-03 (×2): 40 meq via ORAL
  Filled 2020-05-03 (×2): qty 2

## 2020-05-03 MED ORDER — SODIUM CHLORIDE 0.9 % IV SOLN: INTRAVENOUS | Status: DC

## 2020-05-03 MED ORDER — AMLODIPINE BESYLATE 10 MG PO TABS
10.0000 mg | ORAL_TABLET | Freq: Every day | ORAL | Status: DC
  Administered 2020-05-03 – 2020-05-04 (×2): 10 mg via ORAL
  Filled 2020-05-03 (×2): qty 1

## 2020-05-03 MED ORDER — FAMOTIDINE 20 MG PO TABS: 20.0000 mg | ORAL_TABLET | Freq: Every day | ORAL | Status: DC

## 2020-05-03 MED ORDER — MAGNESIUM SULFATE 2 GM/50ML IV SOLN
2.0000 g | Freq: Once | INTRAVENOUS | Status: AC
  Administered 2020-05-03: 2 g via INTRAVENOUS
  Filled 2020-05-03: qty 50

## 2020-05-03 NOTE — Consult Note (Signed)
[facility]    Date of Admission:  05/01/2020     Reason for Consult: elevated lft    Referring Provider: Rito Ehrlich   CC: Elevated lft   Lines:  Peripheral iv's  Abx: none        Assessment: #hiv Very well controlled now for several years, on odefsey It is very unusual if the left elevation would be due to odefsey, especially it is not a new medication  -would continue odefsey for now; avoid acid blocker as that will decrease rilpivirine absorption  #hepatitis/lft elevation #abd pain She wouldn't be someone who is at risk for aids-associated OI process She has moderate lft elevation but otherwise normal bilirubin/alkphos, albumin. However, there is no leukocytosis/new anemia/thrombocytopenia She has dilated cbd on ultrasound/mra but no visualized stone, so could have recently passed a stone. Lipase was normal. She had no fever here A nonspecific viral process is possible as she does have relative leukopenia. Doesn't appear to be frank cholangitis  -GI following; they have ordered hepatitis/hsv/cmv/ebv and planning hida -doesn't appear to be a tick process -if sepsis/hemodynamics disturbance, low threshold to add GNR coverage in setting of lft elevation       ------------------------------------------------ Principal Problem:   RUQ abdominal pain Active Problems:   Human immunodeficiency virus (HIV) disease (HCC)   Benign essential HTN   Elevated transaminase level   Hypokalemia   Hyponatremia   Nausea & vomiting   Transaminitis    HPI: Julia Molina is a 55 y.o. female well controlled hiv here with 2-3 weeks worsening upper abd pain found to have lft elevation  Patient denies any sick contact She has been at her normal baseline health until 2-3 weeks ago when she noted upper abd pain  The pain has been progressive She endorsed assocaited n/v, but no diarrhea  She denies subjective fever, chill, rash, headache,  sorethroat, cough, nasal congestion, joint pain, dysuria/vaginal discharge  She denies tick bite, recent travel  There has been no new medication/exotic food-supplement  She denies ivdu  On admission she is afebrile, found to have lft that is moderately high [place] and mrcp showed biliary dilatation without visualized stone/external obstruction  Gi is following  She has been compliant and well controlled with her hiv. She has been on odefsey for a long time    History reviewed. No pertinent family history.  Social History   Tobacco Use  . Smoking status: Current Every Day Smoker    Packs/day: 0.20    Types: Cigars    Start date: 12/06/1989  . Smokeless tobacco: Never Used  Vaping Use  . Vaping Use: Never used  Substance Use Topics  . Alcohol use: No  . Drug use: Not Currently    Types: Marijuana    No Known Allergies  Review of Systems: ROS All Other ROS was negative, except mentioned above   Past Medical History:  Diagnosis Date  . Benign essential HTN 10/03/2015  . Bilateral hip pain 06/15/2017  . High cholesterol   . HIV (human immunodeficiency virus infection) (HCC)   . Joint pain 06/15/2017  . Myalgia 06/15/2017  . Osteoarthritis 08/05/2016       Scheduled Meds: . amLODipine  10 mg Oral Daily  . emtricitabine-rilpivir-tenofovir AF  1 tablet Oral Q breakfast  . folic acid  1 mg Oral Daily  . multivitamin with minerals  1 tablet Oral Daily  . potassium chloride  40 mEq Oral BID  .  thiamine  100 mg Intravenous Daily   Or  . thiamine  100 mg Oral Daily   Continuous Infusions: . sodium chloride 75 mL/hr at 05/03/20 0827   PRN Meds:.hydrALAZINE, HYDROmorphone (DILAUDID) injection, LORazepam **OR** LORazepam, ondansetron **OR** ondansetron (ZOFRAN) IV, oxyCODONE   OBJECTIVE: Blood pressure (!) 120/91, pulse 79, temperature 98.2 F (36.8 C), temperature source Oral, resp. rate 19, height 5\' 6"  (1.676 m), weight 63.2 kg, last menstrual period 06/14/2012,  SpO2 99 %.  Physical Exam Well developed patient, nontoxic appearing, conversant Heent: per; conj clear; eomi; oropahrynx clear Neck supple Skin no rash cv rrr no mrg Lungs clear; normal respiratory effort abd mild tenderness diffusely but no rebound/guarding; soft Ext no edema Neuro cn2-12 intact; strength/reflex normal Psych alert/oriented Msk; no muscle tenderness; no peripheral joint tenderness/swelling    Lab Results Lab Results  Component Value Date   WBC 2.4 (L) 05/03/2020   HGB 12.7 05/03/2020   HCT 36.4 05/03/2020   MCV 86.3 05/03/2020   PLT 229 05/03/2020    Lab Results  Component Value Date   CREATININE 0.83 05/03/2020   BUN 8 05/03/2020   NA 129 (L) 05/03/2020   K 3.0 (L) 05/03/2020   CL 96 (L) 05/03/2020   CO2 20 (L) 05/03/2020    Lab Results  Component Value Date   ALT 202 (H) 05/03/2020   AST 439 (H) 05/03/2020   ALKPHOS 115 05/03/2020   BILITOT 0.9 05/03/2020      Microbiology: Recent Results (from the past 240 hour(s))  Resp Panel by RT-PCR (Flu A&B, Covid) Nasopharyngeal Swab     Status: None   Collection Time: 05/01/20  9:44 PM   Specimen: Nasopharyngeal Swab; Nasopharyngeal(NP) swabs in vial transport medium  Result Value Ref Range Status   SARS Coronavirus 2 by RT PCR NEGATIVE NEGATIVE Final    Comment: (NOTE) SARS-CoV-2 target nucleic acids are NOT DETECTED.  The SARS-CoV-2 RNA is generally detectable in upper respiratory specimens during the acute phase of infection. The lowest concentration of SARS-CoV-2 viral copies this assay can detect is 138 copies/mL. A negative result does not preclude SARS-Cov-2 infection and should not be used as the sole basis for treatment or other patient management decisions. A negative result may occur with  improper specimen collection/handling, submission of specimen other than nasopharyngeal swab, presence of viral mutation(s) within the areas targeted by this assay, and inadequate number of  viral copies(<138 copies/mL). A negative result must be combined with clinical observations, patient history, and epidemiological information. The expected result is Negative.  Fact Sheet for Patients:  05/03/20  Fact Sheet for Healthcare Providers:  BloggerCourse.com  This test is no t yet approved or cleared by the [employer] FDA and  has been authorized for detection and/or diagnosis of SARS-CoV-2 by FDA under an Emergency Use Authorization (EUA). This EUA will remain  in effect (meaning this test can be used) for the duration of the COVID-19 declaration under Section 564(b)(1) of the Act, 21 U.S.C.section 360bbb-3(b)(1), unless the authorization is terminated  or revoked sooner.       Influenza A by PCR NEGATIVE NEGATIVE Final   Influenza B by PCR NEGATIVE NEGATIVE Final    Comment: (NOTE) The Xpert Xpress SARS-CoV-2/FLU/RSV plus assay is intended as an aid in the diagnosis of influenza from Nasopharyngeal swab specimens and should not be used as a sole basis for treatment. Nasal washings and aspirates are unacceptable for Xpert Xpress SARS-CoV-2/FLU/RSV testing.  Fact Sheet for Patients: [place]  Fact Sheet  for Healthcare Providers: [employer]  This test is not yet approved or cleared by the [place] and has been authorized for detection and/or diagnosis of SARS-CoV-2 by FDA under an Emergency Use Authorization (EUA). This EUA will remain in effect (meaning this test can be used) for the duration of the COVID-19 declaration under Section 564(b)(1) of the Act, 21 U.S.C. section 360bbb-3(b)(1), unless the authorization is terminated or revoked.  Performed at [facility], [address] [place], [place] [address]      Serology: 3/31 acute hep panel negative  Lab Results  Component Value Date   HIV1RNAQUANT <20  DETECTED (A) 03/16/2019   Lab Results  Component Value Date   CD4TCELL 47 03/16/2019   CD4TABS 1,083 03/16/2019     Imaging: If present, new imagings (plain films, ct scans, and mri) have been personally visualized and interpreted; radiology reports have been reviewed. Decision making incorporated into the Impression / Recommendations.  3/30 mra abdomen 1. The extrahepatic common bile duct is mildly dilated to 8 mm without visible obstructing stone and with smooth distal tapering. Appearance is certainly less conspicuous than on comparison ultrasound imaging. No visible intraductal filling defects are identified. 2. Normal appearance of the gallbladder without evidence of cholelithiasis or MR features of acute cholecystitis. 3. Hepatic steatosis.  3/29 ruq u/s 1. Dilated common bile duct measuring up to 12 mm. Correlate with liver function tests. Consider MRCP for further evaluation if clinically indicated. 2. Hepatic steatosis. Please note limited evaluation for focal hepatic masses in a patient with hepatic steatosis due to decreased penetration of the acoustic ultrasound waves.  Raymondo Band, MD [facility] [phone] pager    05/03/2020, 10:47 AM

## 2020-05-03 NOTE — Progress Notes (Addendum)
[facility] PROGRESS NOTE   Julia Molina:[id number] DOB: [birth year] DOA: 05/01/2020  PCP: Daiva Eves, Lisette Grinder, MD  Brief History/Interval Summary: 55 y.o. female with medical history significant for HTN, HLD, OA, HIV who presented with abdominal pain nausea and vomiting.  She was found to have elevated transaminases.  Imaging studies suggested dilated bile ducts.  She was hospitalized for further management.    Consultants: [facility]  Procedures: None yet  Antibiotics: Anti-infectives (From admission, onward)   Start     Dose/Rate Route Frequency Ordered Stop   05/02/20 0800  emtricitabine-rilpivir-tenofovir AF (ODEFSEY) 200-25-25 MG per tablet 1 tablet        1 tablet Oral Daily with breakfast 05/02/20 0602        Subjective/Interval History: Patient denies any abdominal pain this morning but did have a few episodes of stools with mucus.  Denies any watery stool.  No nausea or vomiting.  No other complaints.       Assessment/Plan:  Transaminitis Noted to have elevated AST and ALT.  Imaging study showed dilated bile ducts.   MRCP was subsequently done which also showed dilated bile ducts but not as much as the ultrasound.  No stones noted.  Gallbladder was noted to be normal. Hepatitis panel is pending.  LFTs noted to be higher today.  Statin has been discontinued.  Gastroenterology is following.   Will discuss with ID if patient's antiretroviral treatment could be responsible for her abnormal liver function test.  Hematochezia Minor episodes.  None noted in the hospital.  Hemoglobin stable.  Outpatient colonoscopy.   Essential hypertension Blood pressure was quite elevated yesterday.  Anxiety could be contributing.  Seems to be better this morning.  Amlodipine was added to her hydrochlorothiazide.  However her sodium is noted to be lower today along with potassium.  Could be due to IV diuretics and we will discontinue her HCTZ.  Increase the dose of  the amlodipine.  Hyponatremia and hypokalemia  Sodium level noted to be lower today along with potassium.  Potassium will be repleted.  Magnesium 1.5 and will be repleted as well.  Normal saline infusion.  Stop HCTZ.  Alcohol use disorder Consumed 40 ounces of beer on a daily basis along with 2 of the wine coolers.  No significant tremors noted.  Continue CIWA protocol.  Thiamine folic acid.    HIV/leukopenia Followed by infectious disease.  Currently on Odefsey.  Will discuss with ID if this medication can be responsible for her abnormal LFTs.   Reason for leukopenia not entirely clear.  B12 and folic acid levels normal.  DVT Prophylaxis: SCDs Code Status: Full code Family Communication: Discussed with the patient Disposition Plan: Hopefully return home when improved  Status is: Inpatient  Remains inpatient appropriate because:Ongoing diagnostic testing needed not appropriate for outpatient work up and Inpatient level of care appropriate due to severity of illness   Dispo: The patient is from: Home              Anticipated d/c is to: Home              Patient currently is not medically stable to d/c.   Difficult to place patient No      Medications:  Scheduled: . amLODipine  5 mg Oral Daily  . emtricitabine-rilpivir-tenofovir AF  1 tablet Oral Q breakfast  . famotidine  20 mg Oral Daily  . folic acid  1 mg Oral Daily  . hydrochlorothiazide  25 mg Oral  Daily  . multivitamin with minerals  1 tablet Oral Daily  . potassium chloride  40 mEq Oral BID  . thiamine  100 mg Intravenous Daily   Or  . thiamine  100 mg Oral Daily   Continuous: . sodium chloride 75 mL/hr at 05/03/20 0827   KCM:[id number], HYDROmorphone (DILAUDID) injection, LORazepam **OR** LORazepam, ondansetron **OR** ondansetron (ZOFRAN) IV, oxyCODONE   Objective:  Vital Signs  Vitals:   05/02/20 1430 05/02/20 1738 05/02/20 2029 05/03/20 0535  BP: (!) 179/115 (!) 174/114 113/78 (!) 120/91  Pulse: 92  (!) 101 89 79  Resp: 20 18 19 19   Temp: 98.9 F (37.2 C) 98.9 F (37.2 C) 99 F (37.2 C) 98.2 F (36.8 C)  TempSrc: Oral Oral Oral Oral  SpO2: 100% 100% 99% 99%  Weight:      Height:       No intake or output data in the 24 hours ending 05/03/20 1023 Filed Weights   05/01/20 1940 05/02/20 0236  Weight: 63 kg 63.2 kg    General appearance: Awake alert.  In no distress Resp: Clear to auscultation bilaterally.  Normal effort Cardio: S1-S2 is normal regular.  No S3-S4.  No rubs murmurs or bruit GI: Abdomen is soft.  Nontender nondistended.  Bowel sounds are present normal.  No masses organomegaly Extremities: No edema.  Full range of motion of lower extremities. Neurologic: Alert and oriented x3.  No focal neurological deficits.     Lab Results:  Data Reviewed: I have personally reviewed following labs and imaging studies  CBC: Recent Labs  Lab 05/01/20 2044 05/02/20 0814 05/03/20 0303  WBC 2.8* 2.6* 2.4*  HGB 14.6 12.9 12.7  HCT 40.2 37.0 36.4  MCV 83.2 86.7 86.3  PLT 289 255 229    Basic Metabolic Panel: Recent Labs  Lab 05/01/20 2044 05/02/20 0814 05/03/20 0303  NA 129* 134* 129*  K 3.1* 3.9 3.0*  CL 91* 100 96*  CO2 26 24 20*  GLUCOSE 120* 111* 124*  BUN 11 13 8   CREATININE 0.72 0.91 0.83  CALCIUM 9.0 8.4* 8.7*  MG  --   --  1.5*    GFR: Estimated Creatinine Clearance: 72.5 mL/min (by C-G formula based on SCr of 0.83 mg/dL).  Liver Function Tests: Recent Labs  Lab 05/01/20 2044 05/02/20 0814 05/03/20 0303  AST 164* 130* 439*  ALT 139* 116* 202*  ALKPHOS 109 97 115  BILITOT 0.4 0.7 0.9  PROT 8.1 7.1 7.4  ALBUMIN 4.2 3.8 3.9    Recent Labs  Lab 05/01/20 2044  LIPASE 35     Recent Results (from the past 240 hour(s))  Resp Panel by RT-PCR (Flu A&B, Covid) Nasopharyngeal Swab     Status: None   Collection Time: 05/01/20  9:44 PM   Specimen: Nasopharyngeal Swab; Nasopharyngeal(NP) swabs in vial transport medium  Result Value Ref Range  Status   SARS Coronavirus 2 by RT PCR NEGATIVE NEGATIVE Final    Comment: (NOTE) SARS-CoV-2 target nucleic acids are NOT DETECTED.  The SARS-CoV-2 RNA is generally detectable in upper respiratory specimens during the acute phase of infection. The lowest concentration of SARS-CoV-2 viral copies this assay can detect is 138 copies/mL. A negative result does not preclude SARS-Cov-2 infection and should not be used as the sole basis for treatment or other patient management decisions. A negative result may occur with  improper specimen collection/handling, submission of specimen other than nasopharyngeal swab, presence of viral mutation(s) within the areas targeted by this assay,  and inadequate number of viral copies(<138 copies/mL). A negative result must be combined with clinical observations, patient history, and epidemiological information. The expected result is Negative.  Fact Sheet for Patients:  BloggerCourse.com  Fact Sheet for Healthcare Providers:  SeriousBroker.it  This test is no t yet approved or cleared by the [place] FDA and  has been authorized for detection and/or diagnosis of SARS-CoV-2 by FDA under an Emergency Use Authorization (EUA). This EUA will remain  in effect (meaning this test can be used) for the duration of the COVID-19 declaration under Section 564(b)(1) of the Act, 21 U.S.C.section 360bbb-3(b)(1), unless the authorization is terminated  or revoked sooner.       Influenza A by PCR NEGATIVE NEGATIVE Final   Influenza B by PCR NEGATIVE NEGATIVE Final    Comment: (NOTE) The Xpert Xpress SARS-CoV-2/FLU/RSV plus assay is intended as an aid in the diagnosis of influenza from Nasopharyngeal swab specimens and should not be used as a sole basis for treatment. Nasal washings and aspirates are unacceptable for Xpert Xpress SARS-CoV-2/FLU/RSV testing.  Fact Sheet for  Patients: BloggerCourse.com  Fact Sheet for Healthcare Providers: SeriousBroker.it  This test is not yet approved or cleared by the [place] FDA and has been authorized for detection and/or diagnosis of SARS-CoV-2 by FDA under an Emergency Use Authorization (EUA). This EUA will remain in effect (meaning this test can be used) for the duration of the COVID-19 declaration under Section 564(b)(1) of the Act, 21 U.S.C. section 360bbb-3(b)(1), unless the authorization is terminated or revoked.  Performed at [facility], [address], [place], [place] [address]       Radiology Studies: MR 3D Recon At Scanner  Result Date: 05/02/2020 CLINICAL DATA:  Right upper quadrant abdominal pain, nondiagnostic ultrasound, dilated CBD EXAM: MRI ABDOMEN WITHOUT AND WITH CONTRAST (INCLUDING MRCP) TECHNIQUE: Multiplanar multisequence MR imaging of the abdomen was performed both before and after the administration of intravenous contrast. Heavily T2-weighted images of the biliary and pancreatic ducts were obtained, and three-dimensional MRCP images were rendered by post processing. CONTRAST:  62mL GADAVIST GADOBUTROL 1 MMOL/ML IV SOLN COMPARISON:  Ultrasound 05/02/2020 FINDINGS: Lower chest: Lung bases are grossly clear within the limitations of MR imaging. Normal cardiac size. No pericardial effusion. Hepatobiliary: No visible focal liver lesion. There is diffuse hepatic signal dropout with more geographic signal in the posterior right lobe liver which is fairly typical for findings of hepatic steatosis. Regions of focal fatty sparing seen along the gallbladder fossa. No concerning focal liver lesion is evident. Smooth liver surface contour. No significant intrahepatic biliary ductal dilatation. The extrahepatic common bile duct is mildly dilated to 8 mm though is certainly less conspicuous than on the comparison ultrasound imaging. No visible  intraductal filling defects are identified. Normal appearance of the gallbladder without visible filling defect/gallstones. Accounting for some mild motion artifact, no significant postcontrast enhancement of the gallbladder wall. Pancreas: Smooth tapering of the distal common bile duct and proximal pancreatic duct at the level of the pancreatic head. No significant pancreatic ductal dilatation is seen. Uniform pancreatic enhancement without visible lesion. No peripancreatic fluid or inflammation. Spleen:  Normal in size. No concerning splenic lesions. Adrenals/Urinary Tract: Normal adrenal glands. Kidneys are normally located with symmetric enhancement. No suspicious renal lesion. No hydronephrosis. Stomach/Bowel: Visualized portions within the abdomen are unremarkable. Vascular/Lymphatic: No pathologically enlarged lymph nodes identified. No abdominal aortic aneurysm demonstrated. Other:  None. Musculoskeletal: No suspicious bone lesions or abnormal marrow signal. Grossly normal cord  signal within the imaged levels. IMPRESSION: 1. The extrahepatic common bile duct is mildly dilated to 8 mm without visible obstructing stone and with smooth distal tapering. Appearance is certainly less conspicuous than on comparison ultrasound imaging. No visible intraductal filling defects are identified. 2. Normal appearance of the gallbladder without evidence of cholelithiasis or MR features of acute cholecystitis. 3. Hepatic steatosis. Electronically Signed   By: Kreg Shropshire M.D.   On: 05/02/2020 22:41   DG Chest Portable 1 View  Result Date: 05/01/2020 CLINICAL DATA:  Cough and chills. Abdominal pain with nausea and vomiting. EXAM: PORTABLE CHEST 1 VIEW COMPARISON:  04/04/2015 radiographs FINDINGS: The heart size and mediastinal contours are within normal limits. Both lungs are clear. The visualized skeletal structures are unremarkable. IMPRESSION: No active disease. Electronically Signed   By: Gaylyn Rong M.D.    On: 05/01/2020 21:39   MR ABDOMEN MRCP W WO CONTAST  Result Date: 05/02/2020 CLINICAL DATA:  Right upper quadrant abdominal pain, nondiagnostic ultrasound, dilated CBD EXAM: MRI ABDOMEN WITHOUT AND WITH CONTRAST (INCLUDING MRCP) TECHNIQUE: Multiplanar multisequence MR imaging of the abdomen was performed both before and after the administration of intravenous contrast. Heavily T2-weighted images of the biliary and pancreatic ducts were obtained, and three-dimensional MRCP images were rendered by post processing. CONTRAST:  76mL GADAVIST GADOBUTROL 1 MMOL/ML IV SOLN COMPARISON:  Ultrasound 05/02/2020 FINDINGS: Lower chest: Lung bases are grossly clear within the limitations of MR imaging. Normal cardiac size. No pericardial effusion. Hepatobiliary: No visible focal liver lesion. There is diffuse hepatic signal dropout with more geographic signal in the posterior right lobe liver which is fairly typical for findings of hepatic steatosis. Regions of focal fatty sparing seen along the gallbladder fossa. No concerning focal liver lesion is evident. Smooth liver surface contour. No significant intrahepatic biliary ductal dilatation. The extrahepatic common bile duct is mildly dilated to 8 mm though is certainly less conspicuous than on the comparison ultrasound imaging. No visible intraductal filling defects are identified. Normal appearance of the gallbladder without visible filling defect/gallstones. Accounting for some mild motion artifact, no significant postcontrast enhancement of the gallbladder wall. Pancreas: Smooth tapering of the distal common bile duct and proximal pancreatic duct at the level of the pancreatic head. No significant pancreatic ductal dilatation is seen. Uniform pancreatic enhancement without visible lesion. No peripancreatic fluid or inflammation. Spleen:  Normal in size. No concerning splenic lesions. Adrenals/Urinary Tract: Normal adrenal glands. Kidneys are normally located with symmetric  enhancement. No suspicious renal lesion. No hydronephrosis. Stomach/Bowel: Visualized portions within the abdomen are unremarkable. Vascular/Lymphatic: No pathologically enlarged lymph nodes identified. No abdominal aortic aneurysm demonstrated. Other:  None. Musculoskeletal: No suspicious bone lesions or abnormal marrow signal. Grossly normal cord signal within the imaged levels. IMPRESSION: 1. The extrahepatic common bile duct is mildly dilated to 8 mm without visible obstructing stone and with smooth distal tapering. Appearance is certainly less conspicuous than on comparison ultrasound imaging. No visible intraductal filling defects are identified. 2. Normal appearance of the gallbladder without evidence of cholelithiasis or MR features of acute cholecystitis. 3. Hepatic steatosis. Electronically Signed   By: Kreg Shropshire M.D.   On: 05/02/2020 22:41   US Abdomen Limited RUQ (LIVER/GB)  Addendum Date: 05/02/2020   ADDENDUM REPORT: 05/02/2020 22:35 ADDENDUM: Please note findings should state diameter (of common bile duct): 12 mm. (Not 16) Electronically Signed   By: Tish Frederickson M.D.   On: 05/02/2020 22:35   Result Date: 05/02/2020 CLINICAL DATA:  Abdominal pain. EXAM: ULTRASOUND  ABDOMEN LIMITED RIGHT UPPER QUADRANT COMPARISON:  None. FINDINGS: Gallbladder: No gallstones or wall thickening visualized. No sonographic Murphy sign noted by sonographer. Common bile duct: Diameter: 16 mm. Liver: No focal lesion identified. Increased parenchymal echogenicity. Portal vein is patent on color Doppler imaging with normal direction of blood flow towards the liver. Other: None. IMPRESSION: 1. Dilated common bile duct measuring up to 12 mm. Correlate with liver function tests. Consider MRCP for further evaluation if clinically indicated. 2. Hepatic steatosis. Please note limited evaluation for focal hepatic masses in a patient with hepatic steatosis due to decreased penetration of the acoustic ultrasound waves.  Electronically Signed: By: Tish FredericksonMorgane  Naveau M.D. On: 05/01/2020 22:25       LOS: 1 day   Shanty Ginty  [facility] Pager on [email or web address]  05/03/2020, 10:23 AM

## 2020-05-03 NOTE — Progress Notes (Signed)
Initial Nutrition Assessment  INTERVENTION:   Once diet advanced: -Ensure Enlive po BID, each supplement provides 350 kcal and 20 grams of protein  -Multivitamin with minerals daily  NUTRITION DIAGNOSIS:   Increased nutrient needs related to chronic illness (HIV, alcohol abuse) as evidenced by estimated needs.  GOAL:   Patient will meet greater than or equal to 90% of their needs  MONITOR:   PO intake,Supplement acceptance,Diet advancement,Labs,Weight trends,I & O's  REASON FOR ASSESSMENT:   Malnutrition Screening Tool    ASSESSMENT:   55 y.o. female with medical history significant for HTN, HLD, OA, HIV who presented with abdominal pain nausea and vomiting.  She was found to have elevated transaminases.  Imaging studies suggested dilated bile ducts.  Patient currently NPO awaiting HIDA and possible EGD/colonoscopy. Pt states she is hungry and ready to have the procedures over with so she can eat. Tolerated clears yesterday. Endorses poor appetite and last meal was on 3/28. Typically she only eats 1-2 times a day and states these are not full meals. She will eat chicken and vegetables usually. Pt with history of alcohol abuse PTA and this has most likely impacted intakes.  She is agreeable to receiving supplements following diet advancement.   Per weight records, weights have been trending down. Reports UBW of 154 lbs. Unsure of time frame of weight loss. At risk of malnutrition if continues drinking and poor PO.   Medications: Folic acid, Multivitamin with minerals daily, KLOR -CON, Thiamine, IV Mg sulfate  Labs reviewed:  Low Na, K, Mg  NUTRITION - FOCUSED PHYSICAL EXAM:  No depletions noted.  Diet Order:   Diet Order            Diet clear liquid Room service appropriate? Yes; Fluid consistency: Thin  Diet effective now                 EDUCATION NEEDS:   No education needs have been identified at this time  Skin:  Skin Assessment: Reviewed RN  Assessment  Last BM:  3/31  Height:   Ht Readings from Last 1 Encounters:  05/02/20 5\' 6"  (1.676 m)    Weight:   Wt Readings from Last 1 Encounters:  05/02/20 63.2 kg   BMI:  Body mass index is 22.49 kg/m.  Estimated Nutritional Needs:   Kcal:  1800-2000  Protein:  90-100g  Fluid:  1.8L/day  05/04/20, MS, RD, LDN Inpatient Clinical Dietitian Contact information available via Amion

## 2020-05-03 NOTE — Progress Notes (Addendum)
Attending physician's note   I have taken an interval history, reviewed the chart and examined the patient. I agree with the Advanced Practitioner's note, impression, and recommendations as outlined.   Uptrending liver enzymes with AST/ALT 4039/202 (130/116 yesterday).  Normal T bili and ALP.  Acute viral hep panel pending.  WBC 2.4 and stable, otherwise normal CBC.  1) Elevated liver enzymes -MRCP with 8 mm CBD, otherwise unremarkable and no choledocholithiasis -Plan for HIDA CCK today -Continue liver enzyme trend -Viral hepatitis panel pending.  Adding HSV, CMV, EBV, etc.  2) Epigastric pain 3) Nausea/Vomiting 4) Decreased appetite -If HIDA CCK unremarkable, plan for EGD tomorrow -N.p.o. at midnight in anticipation of potential EGD tomorrow  5) Hematochezia Limited, scant hematochezia in the setting of straining to have BM.  Discussed colonoscopy as an inpatient vs getting done as outpatient.  Based on limited symptomatology, prefers the latter, which seems reasonable -Recommend MiraLAX prn to avoid straining to have BM  Geraldyne Barraclough, DO, FACG [phone] office             [facility] Progress Note  CC:  Abdominal pain and elevated LFTs  Subjective:  Feels ok.  No significant pain.  Wants to eat, but understands she cannot for her testing.  No further rectal bleeding.  Objective:  Vital signs in last 24 hours: Temp:  [98.2 F (36.8 C)-99 F (37.2 C)] 98.2 F (36.8 C) (03/31 0535) Pulse Rate:  [79-101] 79 (03/31 0535) Resp:  [18-20] 19 (03/31 0535) BP: (113-179)/(78-119) 120/91 (03/31 0535) SpO2:  [99 %-100 %] 99 % (03/31 0535) Last BM Date: 05/01/20 General:  Alert, Well-developed, in NAD Heart:  Regular rate and rhythm; no murmurs Pulm:  CTAB.  No W/R/R. Abdomen:  Soft, non-distended.  BS present.  Non-tender. Extremities:  Without edema. Neurologic:  Alert and oriented x 4;  grossly normal neurologically. Psych:  Alert and cooperative.  Normal mood and affect.  Lab Results: Recent Labs    05/01/20 2044 05/02/20 0814 05/03/20 0303  WBC 2.8* 2.6* 2.4*  HGB 14.6 12.9 12.7  HCT 40.2 37.0 36.4  PLT 289 255 229   BMET Recent Labs    05/01/20 2044 05/02/20 0814 05/03/20 0303  NA 129* 134* 129*  K 3.1* 3.9 3.0*  CL 91* 100 96*  CO2 26 24 20*  GLUCOSE 120* 111* 124*  BUN 11 13 8   CREATININE 0.72 0.91 0.83  CALCIUM 9.0 8.4* 8.7*   LFT Recent Labs    05/03/20 0303  PROT 7.4  ALBUMIN 3.9  AST 439*  ALT 202*  ALKPHOS 115  BILITOT 0.9   MR 3D Recon At Scanner  Result Date: 05/02/2020 CLINICAL DATA:  Right upper quadrant abdominal pain, nondiagnostic ultrasound, dilated CBD EXAM: MRI ABDOMEN WITHOUT AND WITH CONTRAST (INCLUDING MRCP) TECHNIQUE: Multiplanar multisequence MR imaging of the abdomen was performed both before and after the administration of intravenous contrast. Heavily T2-weighted images of the biliary and pancreatic ducts were obtained, and three-dimensional MRCP images were rendered by post processing. CONTRAST:  6mL GADAVIST GADOBUTROL 1 MMOL/ML IV SOLN COMPARISON:  Ultrasound 05/02/2020 FINDINGS: Lower chest: Lung bases are grossly clear within the limitations of MR imaging. Normal cardiac size. No pericardial effusion. Hepatobiliary: No visible focal liver lesion. There is diffuse hepatic signal dropout with more geographic signal in the posterior right lobe liver which is fairly typical for findings of hepatic steatosis. Regions of focal fatty sparing seen along the gallbladder fossa. No concerning focal liver lesion is evident. Smooth  liver surface contour. No significant intrahepatic biliary ductal dilatation. The extrahepatic common bile duct is mildly dilated to 8 mm though is certainly less conspicuous than on the comparison ultrasound imaging. No visible intraductal filling defects are identified. Normal appearance of the gallbladder without visible filling defect/gallstones. Accounting for some  mild motion artifact, no significant postcontrast enhancement of the gallbladder wall. Pancreas: Smooth tapering of the distal common bile duct and proximal pancreatic duct at the level of the pancreatic head. No significant pancreatic ductal dilatation is seen. Uniform pancreatic enhancement without visible lesion. No peripancreatic fluid or inflammation. Spleen:  Normal in size. No concerning splenic lesions. Adrenals/Urinary Tract: Normal adrenal glands. Kidneys are normally located with symmetric enhancement. No suspicious renal lesion. No hydronephrosis. Stomach/Bowel: Visualized portions within the abdomen are unremarkable. Vascular/Lymphatic: No pathologically enlarged lymph nodes identified. No abdominal aortic aneurysm demonstrated. Other:  None. Musculoskeletal: No suspicious bone lesions or abnormal marrow signal. Grossly normal cord signal within the imaged levels. IMPRESSION: 1. The extrahepatic common bile duct is mildly dilated to 8 mm without visible obstructing stone and with smooth distal tapering. Appearance is certainly less conspicuous than on comparison ultrasound imaging. No visible intraductal filling defects are identified. 2. Normal appearance of the gallbladder without evidence of cholelithiasis or MR features of acute cholecystitis. 3. Hepatic steatosis. Electronically Signed   By: Kreg Shropshire M.D.   On: 05/02/2020 22:41   DG Chest Portable 1 View  Result Date: 05/01/2020 CLINICAL DATA:  Cough and chills. Abdominal pain with nausea and vomiting. EXAM: PORTABLE CHEST 1 VIEW COMPARISON:  04/04/2015 radiographs FINDINGS: The heart size and mediastinal contours are within normal limits. Both lungs are clear. The visualized skeletal structures are unremarkable. IMPRESSION: No active disease. Electronically Signed   By: Gaylyn Rong M.D.   On: 05/01/2020 21:39   MR ABDOMEN MRCP W WO CONTAST  Result Date: 05/02/2020 CLINICAL DATA:  Right upper quadrant abdominal pain,  nondiagnostic ultrasound, dilated CBD EXAM: MRI ABDOMEN WITHOUT AND WITH CONTRAST (INCLUDING MRCP) TECHNIQUE: Multiplanar multisequence MR imaging of the abdomen was performed both before and after the administration of intravenous contrast. Heavily T2-weighted images of the biliary and pancreatic ducts were obtained, and three-dimensional MRCP images were rendered by post processing. CONTRAST:  45mL GADAVIST GADOBUTROL 1 MMOL/ML IV SOLN COMPARISON:  Ultrasound 05/02/2020 FINDINGS: Lower chest: Lung bases are grossly clear within the limitations of MR imaging. Normal cardiac size. No pericardial effusion. Hepatobiliary: No visible focal liver lesion. There is diffuse hepatic signal dropout with more geographic signal in the posterior right lobe liver which is fairly typical for findings of hepatic steatosis. Regions of focal fatty sparing seen along the gallbladder fossa. No concerning focal liver lesion is evident. Smooth liver surface contour. No significant intrahepatic biliary ductal dilatation. The extrahepatic common bile duct is mildly dilated to 8 mm though is certainly less conspicuous than on the comparison ultrasound imaging. No visible intraductal filling defects are identified. Normal appearance of the gallbladder without visible filling defect/gallstones. Accounting for some mild motion artifact, no significant postcontrast enhancement of the gallbladder wall. Pancreas: Smooth tapering of the distal common bile duct and proximal pancreatic duct at the level of the pancreatic head. No significant pancreatic ductal dilatation is seen. Uniform pancreatic enhancement without visible lesion. No peripancreatic fluid or inflammation. Spleen:  Normal in size. No concerning splenic lesions. Adrenals/Urinary Tract: Normal adrenal glands. Kidneys are normally located with symmetric enhancement. No suspicious renal lesion. No hydronephrosis. Stomach/Bowel: Visualized portions within the abdomen are unremarkable.  Vascular/Lymphatic: No pathologically enlarged lymph nodes identified. No abdominal aortic aneurysm demonstrated. Other:  None. Musculoskeletal: No suspicious bone lesions or abnormal marrow signal. Grossly normal cord signal within the imaged levels. IMPRESSION: 1. The extrahepatic common bile duct is mildly dilated to 8 mm without visible obstructing stone and with smooth distal tapering. Appearance is certainly less conspicuous than on comparison ultrasound imaging. No visible intraductal filling defects are identified. 2. Normal appearance of the gallbladder without evidence of cholelithiasis or MR features of acute cholecystitis. 3. Hepatic steatosis. Electronically Signed   By: Kreg Shropshire M.D.   On: 05/02/2020 22:41   US Abdomen Limited RUQ (LIVER/GB)  Addendum Date: 05/02/2020   ADDENDUM REPORT: 05/02/2020 22:35 ADDENDUM: Please note findings should state diameter (of common bile duct): 12 mm. (Not 16) Electronically Signed   By: Tish Frederickson M.D.   On: 05/02/2020 22:35   Result Date: 05/02/2020 CLINICAL DATA:  Abdominal pain. EXAM: ULTRASOUND ABDOMEN LIMITED RIGHT UPPER QUADRANT COMPARISON:  None. FINDINGS: Gallbladder: No gallstones or wall thickening visualized. No sonographic Murphy sign noted by sonographer. Common bile duct: Diameter: 16 mm. Liver: No focal lesion identified. Increased parenchymal echogenicity. Portal vein is patent on color Doppler imaging with normal direction of blood flow towards the liver. Other: None. IMPRESSION: 1. Dilated common bile duct measuring up to 12 mm. Correlate with liver function tests. Consider MRCP for further evaluation if clinically indicated. 2. Hepatic steatosis. Please note limited evaluation for focal hepatic masses in a patient with hepatic steatosis due to decreased penetration of the acoustic ultrasound waves. Electronically Signed: By: Tish Frederickson M.D. On: 05/01/2020 22:25   Assessment / Plan: *2 weeks of upper abdominal discomfort with  episodes of nausea and vomiting, poor appetite.  AST and ALT elevated and ultrasound showed dilated common bile duct.  MRCP essentially negative.  AST up to 439 and ALT 202 today. *Rectal bleeding: Sounds small-volume on a few occasions with straining to have a bowel movement.  Has never had colonoscopy in the past.  Hgb normal/stable. *HIV: Well-controlled.  Follows with infectious disease regularly. *Hyponatremia:  Na+ 129 again. *Hypokalemia:  K+ 3.0 *ETOH abuse:  Needs abstinence.  LCSW has seen her.  -Will check HIDA scan with CCK. -Pending results of HIDA then may need EGD and if we are going to do that then might as well consider colonoscopy at the same time. -Will check HSV, CMV, EBV acute studies. -Acute viral hepatitis panel is pending.    LOS: 1 day   Princella Pellegrini. Zehr  05/03/2020, 9:10 AM

## 2020-05-04 LAB — COMPREHENSIVE METABOLIC PANEL
ALT: 147 U/L — ABNORMAL HIGH (ref 0–44)
AST: 195 U/L — ABNORMAL HIGH (ref 15–41)
Albumin: 3.5 g/dL (ref 3.5–5.0)
Alkaline Phosphatase: 95 U/L (ref 38–126)
Anion gap: 5 (ref 5–15)
BUN: 8 mg/dL (ref 6–20)
CO2: 21 mmol/L — ABNORMAL LOW (ref 22–32)
Calcium: 8.4 mg/dL — ABNORMAL LOW (ref 8.9–10.3)
Chloride: 107 mmol/L (ref 98–111)
Creatinine, Ser: 0.78 mg/dL (ref 0.44–1.00)
GFR, Estimated: >60 mL/min (ref >60)
Glucose, Bld: 117 mg/dL — ABNORMAL HIGH (ref 70–99)
Potassium: 4.5 mmol/L (ref 3.5–5.1)
Sodium: 133 mmol/L — ABNORMAL LOW (ref 135–145)
Total Bilirubin: 0.6 mg/dL (ref 0.3–1.2)
Total Protein: 6.5 g/dL (ref 6.5–8.1)

## 2020-05-04 LAB — HSV(HERPES SIMPLEX VRS) I + II AB-IGM: HSVI/II Comb IgM: <0.91 Ratio (ref 0.00–0.90)

## 2020-05-04 LAB — CBC
HCT: 34.4 % — ABNORMAL LOW (ref 36.0–46.0)
Hemoglobin: 11.7 g/dL — ABNORMAL LOW (ref 12.0–15.0)
MCH: 30.5 pg (ref 26.0–34.0)
MCHC: 34 g/dL (ref 30.0–36.0)
MCV: 89.6 fL (ref 80.0–100.0)
Platelets: 217 10*3/uL (ref 150–400)
RBC: 3.84 MIL/uL — ABNORMAL LOW (ref 3.87–5.11)
RDW: 13.7 % (ref 11.5–15.5)
WBC: 2.6 10*3/uL — ABNORMAL LOW (ref 4.0–10.5)
nRBC: 0 % (ref 0.0–0.2)

## 2020-05-04 LAB — MAGNESIUM: Magnesium: 1.6 mg/dL — ABNORMAL LOW (ref 1.7–2.4)

## 2020-05-04 LAB — CMV IGM: CMV IgM: <30 AU/mL (ref 0.0–29.9)

## 2020-05-04 LAB — EPSTEIN-BARR VIRUS VCA, IGM: EBV VCA IgM: <36 U/mL (ref 0.0–35.9)

## 2020-05-04 MED ORDER — THIAMINE HCL 100 MG PO TABS: 100.0000 mg | ORAL_TABLET | Freq: Every day | ORAL | 0 refills | Status: AC

## 2020-05-04 MED ORDER — AMLODIPINE BESYLATE 10 MG PO TABS: 10.0000 mg | ORAL_TABLET | Freq: Every day | ORAL | 1 refills | Status: DC

## 2020-05-04 MED ORDER — POLYETHYLENE GLYCOL 3350 17 G PO PACK: 17.0000 g | PACK | Freq: Every day | ORAL | 0 refills | Status: AC

## 2020-05-04 MED ORDER — FOLIC ACID 1 MG PO TABS: 1.0000 mg | ORAL_TABLET | Freq: Every day | ORAL | 0 refills | Status: AC

## 2020-05-04 MED ORDER — MAGNESIUM SULFATE 2 GM/50ML IV SOLN
2.0000 g | Freq: Once | INTRAVENOUS | Status: AC
  Administered 2020-05-04: 2 g via INTRAVENOUS
  Filled 2020-05-04: qty 50

## 2020-05-04 MED ORDER — ENSURE ENLIVE PO LIQD: 237.0000 mL | Freq: Two times a day (BID) | ORAL | Status: DC

## 2020-05-04 NOTE — Progress Notes (Signed)
[facility] PROGRESS NOTE   Julia Molina RSW:[id number] DOB: 1965-12-07 DOA: 05/01/2020  PCP: Daiva Eves, Lisette Grinder, MD  Brief History/Interval Summary: 55 y.o. female with medical history significant for HTN, HLD, OA, HIV who presented with abdominal pain nausea and vomiting.  She was found to have elevated transaminases.  Imaging studies suggested dilated bile ducts.  She was hospitalized for further management.    Consultants:  [facility]  Procedures: None yet  Antibiotics: Anti-infectives (From admission, onward)   Start     Dose/Rate Route Frequency Ordered Stop   05/02/20 0800  emtricitabine-rilpivir-tenofovir AF (ODEFSEY) 200-25-25 MG per tablet 1 tablet        1 tablet Oral Daily with breakfast 05/02/20 0602        Subjective/Interval History: Patient mentions that she is feeling better.  Denies any abdominal pain.  No nausea or vomiting.  Feels hungry.      Assessment/Plan:  Transaminitis Noted to have elevated AST and ALT.  Imaging study showed dilated bile ducts.   MRCP was subsequently done which also showed dilated bile ducts but not as much as the ultrasound.  No stones noted.  Gallbladder was noted to be normal.  Hepatitis panel is noted to be negative.  HIDA scan noted to be unremarkable. Statin was discontinued.  LFTs appear to be fluctuating. According to ID it is unlikely that her antiretroviral treatment is responsible for her abnormal LFTs. HSV CMV EBV is all pending. GI was considering doing upper endoscopy.  Hematochezia Minor episodes.  None noted in the hospital.  Hemoglobin stable.  Outpatient colonoscopy.   Essential hypertension Blood pressures were significantly elevated during the initial part of this hospital stay.  Could have been due to acute issues as well as anxiety.  Improved over the last 24 to 48 hours.  Continue amlodipine.  Hydrochlorothiazide was discontinued due to electrolyte imbalances.    Hyponatremia and  hypokalemia  Sodium level improved today.  Potassium is also better.  Magnesium improved but still low at 1.6 and will be supplemented.  HCTZ was discontinued.  Alcohol use disorder Consumed 40 ounces of beer on a daily basis along with 2 of the wine coolers.  No significant withdrawal symptoms noted. Continue CIWA protocol.  Thiamine folic acid.    HIV/leukopenia Followed by infectious disease.  Currently on Odefsey.  ID is following.   Reason for leukopenia not entirely clear.  B12 and folic acid levels normal.  DVT Prophylaxis: SCDs Code Status: Full code Family Communication: Discussed with the patient Disposition Plan: Hopefully return home when improved  Status is: Inpatient  Remains inpatient appropriate because:Ongoing diagnostic testing needed not appropriate for outpatient work up and Inpatient level of care appropriate due to severity of illness   Dispo: The patient is from: Home              Anticipated d/c is to: Home              Patient currently is not medically stable to d/c.   Difficult to place patient No      Medications:  Scheduled: . amLODipine  10 mg Oral Daily  . emtricitabine-rilpivir-tenofovir AF  1 tablet Oral Q breakfast  . folic acid  1 mg Oral Daily  . multivitamin with minerals  1 tablet Oral Daily  . thiamine  100 mg Intravenous Daily   Or  . thiamine  100 mg Oral Daily   Continuous: . sodium chloride 75 mL/hr at 05/03/20 2132  ZOX:[id number], HYDROmorphone (DILAUDID) injection, LORazepam **OR** LORazepam, ondansetron **OR** ondansetron (ZOFRAN) IV, oxyCODONE   Objective:  Vital Signs  Vitals:   05/03/20 0535 05/03/20 1322 05/03/20 2002 05/04/20 0342  BP: (!) 120/91 (!) 137/94 103/80 121/80  Pulse: 79 74 79 68  Resp: Temp: 98.2 F (36.8 C) 99.5 F (37.5 C) 98.5 F (36.9 C) 98.2 F (36.8 C)  TempSrc: Oral Oral    SpO2: 99% 100% 100% 99%  Weight:      Height:        Intake/Output Summary (Last 24 hours) at  05/04/2020 1026 Last data filed at 05/04/2020 0300 Gross per 24 hour  Intake 1226.76 ml  Output --  Net 1226.76 ml   Filed Weights   05/01/20 1940 05/02/20 0236  Weight: 63 kg 63.2 kg    General appearance: Awake alert.  In no distress Resp: Clear to auscultation bilaterally.  Normal effort Cardio: S1-S2 is normal regular.  No S3-S4.  No rubs murmurs or bruit GI: Abdomen is soft.  Nontender nondistended.  Bowel sounds are present normal.  No masses organomegaly Extremities: No edema.  Full range of motion of lower extremities. Neurologic: Alert and oriented x3.  No focal neurological deficits.     Lab Results:  Data Reviewed: I have personally reviewed following labs and imaging studies  CBC: Recent Labs  Lab 05/01/20 2044 05/02/20 0814 05/03/20 0303 05/04/20 0317  WBC 2.8* 2.6* 2.4* 2.6*  HGB 14.6 12.9 12.7 11.7*  HCT 40.2 37.0 36.4 34.4*  MCV 83.2 86.7 86.3 89.6  PLT 289 255 229 217    Basic Metabolic Panel: Recent Labs  Lab 05/01/20 2044 05/02/20 0814 05/03/20 0303 05/04/20 0317  NA 129* 134* 129* 133*  K 3.1* 3.9 3.0* 4.5  CL 91* 100 96* 107  CO2 26 24 20* 21*  GLUCOSE 120* 111* 124* 117*  BUN CREATININE 0.72 0.91 0.83 0.78  CALCIUM 9.0 8.4* 8.7* 8.4*  MG  --   --  1.5* 1.6*    GFR: Estimated Creatinine Clearance: 75.3 mL/min (by C-G formula based on SCr of 0.78 mg/dL).  Liver Function Tests: Recent Labs  Lab 05/01/20 2044 05/02/20 0814 05/03/20 0303 05/04/20 0317  AST 164* 130* 439* 195*  ALT 139* 116* 202* 147*  ALKPHOS 109 97 115 95  BILITOT 0.4 0.7 0.9 0.6  PROT 8.1 7.1 7.4 6.5  ALBUMIN 4.2 3.8 3.9 3.5    Recent Labs  Lab 05/01/20 2044  LIPASE 35     Recent Results (from the past 240 hour(s))  Resp Panel by RT-PCR (Flu A&B, Covid) Nasopharyngeal Swab     Status: None   Collection Time: 05/01/20  9:44 PM   Specimen: Nasopharyngeal Swab; Nasopharyngeal(NP) swabs in vial transport medium  Result Value Ref Range Status    SARS Coronavirus 2 by RT PCR NEGATIVE NEGATIVE Final    Comment: (NOTE) SARS-CoV-2 target nucleic acids are NOT DETECTED.  The SARS-CoV-2 RNA is generally detectable in upper respiratory specimens during the acute phase of infection. The lowest concentration of SARS-CoV-2 viral copies this assay can detect is 138 copies/mL. A negative result does not preclude SARS-Cov-2 infection and should not be used as the sole basis for treatment or other patient management decisions. A negative result may occur with  improper specimen collection/handling, submission of specimen other than nasopharyngeal swab, presence of viral mutation(s) within the areas targeted by this assay, and inadequate number of viral copies(<138 copies/mL). A  negative result must be combined with clinical observations, patient history, and epidemiological information. The expected result is Negative.  Fact Sheet for Patients:  BloggerCourse.com  Fact Sheet for Healthcare Providers:  SeriousBroker.it  This test is no t yet approved or cleared by the [place] FDA and  has been authorized for detection and/or diagnosis of SARS-CoV-2 by FDA under an Emergency Use Authorization (EUA). This EUA will remain  in effect (meaning this test can be used) for the duration of the COVID-19 declaration under Section 564(b)(1) of the Act, 21 U.S.C.section 360bbb-3(b)(1), unless the authorization is terminated  or revoked sooner.       Influenza A by PCR NEGATIVE NEGATIVE Final   Influenza B by PCR NEGATIVE NEGATIVE Final    Comment: (NOTE) The Xpert Xpress SARS-CoV-2/FLU/RSV plus assay is intended as an aid in the diagnosis of influenza from Nasopharyngeal swab specimens and should not be used as a sole basis for treatment. Nasal washings and aspirates are unacceptable for Xpert Xpress SARS-CoV-2/FLU/RSV testing.  Fact Sheet for  Patients: BloggerCourse.com  Fact Sheet for Healthcare Providers: SeriousBroker.it  This test is not yet approved or cleared by the [place] FDA and has been authorized for detection and/or diagnosis of SARS-CoV-2 by FDA under an Emergency Use Authorization (EUA). This EUA will remain in effect (meaning this test can be used) for the duration of the COVID-19 declaration under Section 564(b)(1) of the Act, 21 U.S.C. section 360bbb-3(b)(1), unless the authorization is terminated or revoked.  Performed at [facility], [address], [place], [place] [address]       Radiology Studies: MR 3D Recon At Scanner  Result Date: 05/02/2020 CLINICAL DATA:  Right upper quadrant abdominal pain, nondiagnostic ultrasound, dilated CBD EXAM: MRI ABDOMEN WITHOUT AND WITH CONTRAST (INCLUDING MRCP) TECHNIQUE: Multiplanar multisequence MR imaging of the abdomen was performed both before and after the administration of intravenous contrast. Heavily T2-weighted images of the biliary and pancreatic ducts were obtained, and three-dimensional MRCP images were rendered by post processing. CONTRAST:  36mL GADAVIST GADOBUTROL 1 MMOL/ML IV SOLN COMPARISON:  Ultrasound 05/02/2020 FINDINGS: Lower chest: Lung bases are grossly clear within the limitations of MR imaging. Normal cardiac size. No pericardial effusion. Hepatobiliary: No visible focal liver lesion. There is diffuse hepatic signal dropout with more geographic signal in the posterior right lobe liver which is fairly typical for findings of hepatic steatosis. Regions of focal fatty sparing seen along the gallbladder fossa. No concerning focal liver lesion is evident. Smooth liver surface contour. No significant intrahepatic biliary ductal dilatation. The extrahepatic common bile duct is mildly dilated to 8 mm though is certainly less conspicuous than on the comparison ultrasound imaging. No visible  intraductal filling defects are identified. Normal appearance of the gallbladder without visible filling defect/gallstones. Accounting for some mild motion artifact, no significant postcontrast enhancement of the gallbladder wall. Pancreas: Smooth tapering of the distal common bile duct and proximal pancreatic duct at the level of the pancreatic head. No significant pancreatic ductal dilatation is seen. Uniform pancreatic enhancement without visible lesion. No peripancreatic fluid or inflammation. Spleen:  Normal in size. No concerning splenic lesions. Adrenals/Urinary Tract: Normal adrenal glands. Kidneys are normally located with symmetric enhancement. No suspicious renal lesion. No hydronephrosis. Stomach/Bowel: Visualized portions within the abdomen are unremarkable. Vascular/Lymphatic: No pathologically enlarged lymph nodes identified. No abdominal aortic aneurysm demonstrated. Other:  None. Musculoskeletal: No suspicious bone lesions or abnormal marrow signal. Grossly normal cord signal within the imaged levels. IMPRESSION: 1. The  extrahepatic common bile duct is mildly dilated to 8 mm without visible obstructing stone and with smooth distal tapering. Appearance is certainly less conspicuous than on comparison ultrasound imaging. No visible intraductal filling defects are identified. 2. Normal appearance of the gallbladder without evidence of cholelithiasis or MR features of acute cholecystitis. 3. Hepatic steatosis. Electronically Signed   By: Kreg Shropshire M.D.   On: 05/02/2020 22:41   NM Hepato W/EF  Result Date: 05/03/2020 CLINICAL DATA:  Upper abdominal pain with nausea and vomiting EXAM: NUCLEAR MEDICINE HEPATOBILIARY IMAGING WITH GALLBLADDER EF VIEWS: Anterior right upper quadrant RADIOPHARMACEUTICALS:  5.5 mCi Tc-51m  Choletec IV COMPARISON:  Ultrasound right upper quadrant May 01, 2020 FINDINGS: Liver uptake of radiotracer is unremarkable. There is visualization of gallbladder and small bowel,  indicating patency of the cystic and common bile ducts. The patient consumed 8 ounces of Ensure orally with calculation of the computer generated ejection fraction of radiotracer the gallbladder. The patient did not experience clinical symptoms with the oral Ensure consumption. The computer generated ejection fraction of radiotracer the gallbladder is normal at 56%, normal greater than 33% using the oral agent. IMPRESSION: Study within normal limits. Electronically Signed   By: Bretta Bang III M.D.   On: 05/03/2020 17:25   MR ABDOMEN MRCP W WO CONTAST  Result Date: 05/02/2020 CLINICAL DATA:  Right upper quadrant abdominal pain, nondiagnostic ultrasound, dilated CBD EXAM: MRI ABDOMEN WITHOUT AND WITH CONTRAST (INCLUDING MRCP) TECHNIQUE: Multiplanar multisequence MR imaging of the abdomen was performed both before and after the administration of intravenous contrast. Heavily T2-weighted images of the biliary and pancreatic ducts were obtained, and three-dimensional MRCP images were rendered by post processing. CONTRAST:  8mL GADAVIST GADOBUTROL 1 MMOL/ML IV SOLN COMPARISON:  Ultrasound 05/02/2020 FINDINGS: Lower chest: Lung bases are grossly clear within the limitations of MR imaging. Normal cardiac size. No pericardial effusion. Hepatobiliary: No visible focal liver lesion. There is diffuse hepatic signal dropout with more geographic signal in the posterior right lobe liver which is fairly typical for findings of hepatic steatosis. Regions of focal fatty sparing seen along the gallbladder fossa. No concerning focal liver lesion is evident. Smooth liver surface contour. No significant intrahepatic biliary ductal dilatation. The extrahepatic common bile duct is mildly dilated to 8 mm though is certainly less conspicuous than on the comparison ultrasound imaging. No visible intraductal filling defects are identified. Normal appearance of the gallbladder without visible filling defect/gallstones. Accounting for  some mild motion artifact, no significant postcontrast enhancement of the gallbladder wall. Pancreas: Smooth tapering of the distal common bile duct and proximal pancreatic duct at the level of the pancreatic head. No significant pancreatic ductal dilatation is seen. Uniform pancreatic enhancement without visible lesion. No peripancreatic fluid or inflammation. Spleen:  Normal in size. No concerning splenic lesions. Adrenals/Urinary Tract: Normal adrenal glands. Kidneys are normally located with symmetric enhancement. No suspicious renal lesion. No hydronephrosis. Stomach/Bowel: Visualized portions within the abdomen are unremarkable. Vascular/Lymphatic: No pathologically enlarged lymph nodes identified. No abdominal aortic aneurysm demonstrated. Other:  None. Musculoskeletal: No suspicious bone lesions or abnormal marrow signal. Grossly normal cord signal within the imaged levels. IMPRESSION: 1. The extrahepatic common bile duct is mildly dilated to 8 mm without visible obstructing stone and with smooth distal tapering. Appearance is certainly less conspicuous than on comparison ultrasound imaging. No visible intraductal filling defects are identified. 2. Normal appearance of the gallbladder without evidence of cholelithiasis or MR features of acute cholecystitis. 3. Hepatic steatosis. Electronically Signed   By:  Kreg ShropshirePrice  DeHay M.D.   On: 05/02/2020 22:41       LOS: 2 days   Ama Mcmaster Rito EhrlichKrishnan  [facility] Pager on [email or web address]  05/04/2020, 10:26 AM

## 2020-05-04 NOTE — Discharge Summary (Signed)
[facility]  Physician Discharge Summary   Patient ID: Julia Molina MRN: [id number] DOB/AGE: 55/06/1965 55 y.o.  Admit date: 05/01/2020 Discharge date: 05/05/2020  PCP: Daiva Eves, Lisette Grinder, MD  DISCHARGE DIAGNOSES:  Acute hepatitis, likely due to a viral illness Mild hematochezia requiring outpatient follow-up Essential hypertension Hyponatremia and hypokalemia Alcohol use disorder HIV Leukopenia  RECOMMENDATIONS FOR OUTPATIENT FOLLOW UP: 1. Outpatient follow-up with GI and ID   Home Health: None Equipment/Devices: None  CODE STATUS: Full code  DISCHARGE CONDITION: fair  Diet recommendation: As before  INITIAL HISTORY: 55 y.o.femalewith medical history significant forHTN, HLD, OA, HIV who presented with abdominal pain nausea and vomiting.  She was found to have elevated transaminases.  Imaging studies suggested dilated bile ducts.  She was hospitalized for further management.    Consultations:  [facility]  Procedures:  None    HOSPITAL COURSE:    Acute hepatitis Noted to have elevated AST and ALT.  Imaging study showed dilated bile ducts.   MRCP was subsequently done which also showed dilated bile ducts but not as much as the ultrasound.  No stones noted.  Gallbladder was noted to be normal.  Hepatitis panel is noted to be negative.  HIDA scan noted to be unremarkable. Statin was discontinued.   LFTs noted to be slightly better today.  Thought to be viral in etiology.  Will hepatitis viral panel was negative.  HSV CMV EBV viral pending at discharge.  GI to follow-up.  Upper endoscopy was briefly considered but considering patient's improvement this was not thought to be necessary at this time. Patient symptomatically has improved.  Cleared by gastroenterology this afternoon for discharge.    Hematochezia Minor episodes.  None noted in the hospital.  Hemoglobin stable.  Outpatient colonoscopy.   Essential hypertension Blood pressures  were significantly elevated during the initial part of this hospital stay.  Could have been due to acute issues as well as anxiety.  Improved over the last 24 to 48 hours.  Continue amlodipine.  Hydrochlorothiazide was discontinued due to electrolyte imbalances.    Hyponatremia and hypokalemia  Sodium level improved today.  Potassium is also better.  Magnesium improved but still low at 1.6 and will be supplemented.  HCTZ was discontinued.  Alcohol use disorder Consumed 40 ounces of beer on a daily basis along with 2 of the wine coolers.  No significant withdrawal symptoms noted. Thiamine folic acid.    HIV/leukopenia Followed by infectious disease.  Currently on Odefsey. Reason for leukopenia not entirely clear.  B12 and folic acid levels normal. Outpatient monitoring  Patient is stable.  Cleared by gastroenterology.  Patient wants to go home.  Okay for discharge home today.   PERTINENT LABS:  The results of significant diagnostics from this hospitalization (including imaging, microbiology, ancillary and laboratory) are listed below for reference.    Microbiology: Recent Results (from the past 240 hour(s))  Resp Panel by RT-PCR (Flu A&B, Covid) Nasopharyngeal Swab     Status: None   Collection Time: 05/01/20  9:44 PM   Specimen: Nasopharyngeal Swab; Nasopharyngeal(NP) swabs in vial transport medium  Result Value Ref Range Status   SARS Coronavirus 2 by RT PCR NEGATIVE NEGATIVE Final    Comment: (NOTE) SARS-CoV-2 target nucleic acids are NOT DETECTED.  The SARS-CoV-2 RNA is generally detectable in upper respiratory specimens during the acute phase of infection. The lowest concentration of SARS-CoV-2 viral copies this assay can detect is 138 copies/mL. A negative result does not preclude SARS-Cov-2 infection  and should not be used as the sole basis for treatment or other patient management decisions. A negative result may occur with  improper specimen collection/handling,  submission of specimen other than nasopharyngeal swab, presence of viral mutation(s) within the areas targeted by this assay, and inadequate number of viral copies(<138 copies/mL). A negative result must be combined with clinical observations, patient history, and epidemiological information. The expected result is Negative.  Fact Sheet for Patients:  BloggerCourse.com  Fact Sheet for Healthcare Providers:  SeriousBroker.it  This test is no t yet approved or cleared by the [place] FDA and  has been authorized for detection and/or diagnosis of SARS-CoV-2 by FDA under an Emergency Use Authorization (EUA). This EUA will remain  in effect (meaning this test can be used) for the duration of the COVID-19 declaration under Section 564(b)(1) of the Act, 21 U.S.C.section 360bbb-3(b)(1), unless the authorization is terminated  or revoked sooner.       Influenza A by PCR NEGATIVE NEGATIVE Final   Influenza B by PCR NEGATIVE NEGATIVE Final    Comment: (NOTE) The Xpert Xpress SARS-CoV-2/FLU/RSV plus assay is intended as an aid in the diagnosis of influenza from Nasopharyngeal swab specimens and should not be used as a sole basis for treatment. Nasal washings and aspirates are unacceptable for Xpert Xpress SARS-CoV-2/FLU/RSV testing.  Fact Sheet for Patients: BloggerCourse.com  Fact Sheet for Healthcare Providers: SeriousBroker.it  This test is not yet approved or cleared by the [place] FDA and has been authorized for detection and/or diagnosis of SARS-CoV-2 by FDA under an Emergency Use Authorization (EUA). This EUA will remain in effect (meaning this test can be used) for the duration of the COVID-19 declaration under Section 564(b)(1) of the Act, 21 U.S.C. section 360bbb-3(b)(1), unless the authorization is terminated or revoked.  Performed at [facility], [address] [place], [place] [address]      Labs:  COVID-19 Labs   Lab Results  Component Value Date   SARSCOV2NAA NEGATIVE 05/01/2020      Basic Metabolic Panel: Recent Labs  Lab 05/01/20 2044 05/02/20 0814 05/03/20 0303 05/04/20 0317  NA 129* 134* 129* 133*  K 3.1* 3.9 3.0* 4.5  CL 91* 100 96* 107  CO2 26 24 20* 21*  GLUCOSE 120* 111* 124* 117*  BUN 11 13 8 8   CREATININE 0.72 0.91 0.83 0.78  CALCIUM 9.0 8.4* 8.7* 8.4*  MG  --   --  1.5* 1.6*   Liver Function Tests: Recent Labs  Lab 05/01/20 2044 05/02/20 0814 05/03/20 0303 05/04/20 0317  AST 164* 130* 439* 195*  ALT 139* 116* 202* 147*  ALKPHOS 109 97 115 95  BILITOT 0.4 0.7 0.9 0.6  PROT 8.1 7.1 7.4 6.5  ALBUMIN 4.2 3.8 3.9 3.5   Recent Labs  Lab 05/01/20 2044  LIPASE 35   CBC: Recent Labs  Lab 05/01/20 2044 05/02/20 0814 05/03/20 0303 05/04/20 0317  WBC 2.8* 2.6* 2.4* 2.6*  HGB 14.6 12.9 12.7 11.7*  HCT 40.2 37.0 36.4 34.4*  MCV 83.2 86.7 86.3 89.6  PLT 289 255 229 217     IMAGING STUDIES MR 3D Recon At Scanner  Result Date: 05/02/2020 CLINICAL DATA:  Right upper quadrant abdominal pain, nondiagnostic ultrasound, dilated CBD EXAM: MRI ABDOMEN WITHOUT AND WITH CONTRAST (INCLUDING MRCP) TECHNIQUE: Multiplanar multisequence MR imaging of the abdomen was performed both before and after the administration of intravenous contrast. Heavily T2-weighted images of the biliary and pancreatic ducts were obtained, and  three-dimensional MRCP images were rendered by post processing. CONTRAST:  7mL GADAVIST GADOBUTROL 1 MMOL/ML IV SOLN COMPARISON:  Ultrasound 05/02/2020 FINDINGS: Lower chest: Lung bases are grossly clear within the limitations of MR imaging. Normal cardiac size. No pericardial effusion. Hepatobiliary: No visible focal liver lesion. There is diffuse hepatic signal dropout with more geographic signal in the posterior right lobe liver which is fairly typical for findings of hepatic steatosis.  Regions of focal fatty sparing seen along the gallbladder fossa. No concerning focal liver lesion is evident. Smooth liver surface contour. No significant intrahepatic biliary ductal dilatation. The extrahepatic common bile duct is mildly dilated to 8 mm though is certainly less conspicuous than on the comparison ultrasound imaging. No visible intraductal filling defects are identified. Normal appearance of the gallbladder without visible filling defect/gallstones. Accounting for some mild motion artifact, no significant postcontrast enhancement of the gallbladder wall. Pancreas: Smooth tapering of the distal common bile duct and proximal pancreatic duct at the level of the pancreatic head. No significant pancreatic ductal dilatation is seen. Uniform pancreatic enhancement without visible lesion. No peripancreatic fluid or inflammation. Spleen:  Normal in size. No concerning splenic lesions. Adrenals/Urinary Tract: Normal adrenal glands. Kidneys are normally located with symmetric enhancement. No suspicious renal lesion. No hydronephrosis. Stomach/Bowel: Visualized portions within the abdomen are unremarkable. Vascular/Lymphatic: No pathologically enlarged lymph nodes identified. No abdominal aortic aneurysm demonstrated. Other:  None. Musculoskeletal: No suspicious bone lesions or abnormal marrow signal. Grossly normal cord signal within the imaged levels. IMPRESSION: 1. The extrahepatic common bile duct is mildly dilated to 8 mm without visible obstructing stone and with smooth distal tapering. Appearance is certainly less conspicuous than on comparison ultrasound imaging. No visible intraductal filling defects are identified. 2. Normal appearance of the gallbladder without evidence of cholelithiasis or MR features of acute cholecystitis. 3. Hepatic steatosis. Electronically Signed   By: Kreg Shropshire M.D.   On: 05/02/2020 22:41   NM Hepato W/EF  Result Date: 05/03/2020 CLINICAL DATA:  Upper abdominal pain with  nausea and vomiting EXAM: NUCLEAR MEDICINE HEPATOBILIARY IMAGING WITH GALLBLADDER EF VIEWS: Anterior right upper quadrant RADIOPHARMACEUTICALS:  5.5 mCi Tc-9m  Choletec IV COMPARISON:  Ultrasound right upper quadrant May 01, 2020 FINDINGS: Liver uptake of radiotracer is unremarkable. There is visualization of gallbladder and small bowel, indicating patency of the cystic and common bile ducts. The patient consumed 8 ounces of Ensure orally with calculation of the computer generated ejection fraction of radiotracer the gallbladder. The patient did not experience clinical symptoms with the oral Ensure consumption. The computer generated ejection fraction of radiotracer the gallbladder is normal at 56%, normal greater than 33% using the oral agent. IMPRESSION: Study within normal limits. Electronically Signed   By: Bretta Bang III M.D.   On: 05/03/2020 17:25   DG Chest Portable 1 View  Result Date: 05/01/2020 CLINICAL DATA:  Cough and chills. Abdominal pain with nausea and vomiting. EXAM: PORTABLE CHEST 1 VIEW COMPARISON:  04/04/2015 radiographs FINDINGS: The heart size and mediastinal contours are within normal limits. Both lungs are clear. The visualized skeletal structures are unremarkable. IMPRESSION: No active disease. Electronically Signed   By: Gaylyn Rong M.D.   On: 05/01/2020 21:39   MR ABDOMEN MRCP W WO CONTAST  Result Date: 05/02/2020 CLINICAL DATA:  Right upper quadrant abdominal pain, nondiagnostic ultrasound, dilated CBD EXAM: MRI ABDOMEN WITHOUT AND WITH CONTRAST (INCLUDING MRCP) TECHNIQUE: Multiplanar multisequence MR imaging of the abdomen was performed both before and after the administration of intravenous contrast. Heavily  T2-weighted images of the biliary and pancreatic ducts were obtained, and three-dimensional MRCP images were rendered by post processing. CONTRAST:  7mL GADAVIST GADOBUTROL 1 MMOL/ML IV SOLN COMPARISON:  Ultrasound 05/02/2020 FINDINGS: Lower chest: Lung  bases are grossly clear within the limitations of MR imaging. Normal cardiac size. No pericardial effusion. Hepatobiliary: No visible focal liver lesion. There is diffuse hepatic signal dropout with more geographic signal in the posterior right lobe liver which is fairly typical for findings of hepatic steatosis. Regions of focal fatty sparing seen along the gallbladder fossa. No concerning focal liver lesion is evident. Smooth liver surface contour. No significant intrahepatic biliary ductal dilatation. The extrahepatic common bile duct is mildly dilated to 8 mm though is certainly less conspicuous than on the comparison ultrasound imaging. No visible intraductal filling defects are identified. Normal appearance of the gallbladder without visible filling defect/gallstones. Accounting for some mild motion artifact, no significant postcontrast enhancement of the gallbladder wall. Pancreas: Smooth tapering of the distal common bile duct and proximal pancreatic duct at the level of the pancreatic head. No significant pancreatic ductal dilatation is seen. Uniform pancreatic enhancement without visible lesion. No peripancreatic fluid or inflammation. Spleen:  Normal in size. No concerning splenic lesions. Adrenals/Urinary Tract: Normal adrenal glands. Kidneys are normally located with symmetric enhancement. No suspicious renal lesion. No hydronephrosis. Stomach/Bowel: Visualized portions within the abdomen are unremarkable. Vascular/Lymphatic: No pathologically enlarged lymph nodes identified. No abdominal aortic aneurysm demonstrated. Other:  None. Musculoskeletal: No suspicious bone lesions or abnormal marrow signal. Grossly normal cord signal within the imaged levels. IMPRESSION: 1. The extrahepatic common bile duct is mildly dilated to 8 mm without visible obstructing stone and with smooth distal tapering. Appearance is certainly less conspicuous than on comparison ultrasound imaging. No visible intraductal filling  defects are identified. 2. Normal appearance of the gallbladder without evidence of cholelithiasis or MR features of acute cholecystitis. 3. Hepatic steatosis. Electronically Signed   By: Kreg ShropshirePrice  DeHay M.D.   On: 05/02/2020 22:41   US Abdomen Limited RUQ (LIVER/GB)  Addendum Date: 05/02/2020   ADDENDUM REPORT: 05/02/2020 22:35 ADDENDUM: Please note findings should state diameter (of common bile duct): 12 mm. (Not 16) Electronically Signed   By: Tish FredericksonMorgane  Naveau M.D.   On: 05/02/2020 22:35   Result Date: 05/02/2020 CLINICAL DATA:  Abdominal pain. EXAM: ULTRASOUND ABDOMEN LIMITED RIGHT UPPER QUADRANT COMPARISON:  None. FINDINGS: Gallbladder: No gallstones or wall thickening visualized. No sonographic Murphy sign noted by sonographer. Common bile duct: Diameter: 16 mm. Liver: No focal lesion identified. Increased parenchymal echogenicity. Portal vein is patent on color Doppler imaging with normal direction of blood flow towards the liver. Other: None. IMPRESSION: 1. Dilated common bile duct measuring up to 12 mm. Correlate with liver function tests. Consider MRCP for further evaluation if clinically indicated. 2. Hepatic steatosis. Please note limited evaluation for focal hepatic masses in a patient with hepatic steatosis due to decreased penetration of the acoustic ultrasound waves. Electronically Signed: By: Tish FredericksonMorgane  Naveau M.D. On: 05/01/2020 22:25    DISCHARGE EXAMINATION: Please review progress note from earlier today  DISPOSITION: Home  Discharge Instructions    Call MD for:  difficulty breathing, headache or visual disturbances   Complete by: As directed    Call MD for:  extreme fatigue   Complete by: As directed    Call MD for:  persistant dizziness or light-headedness   Complete by: As directed    Call MD for:  persistant nausea and vomiting   Complete by: As  directed    Call MD for:  severe uncontrolled pain   Complete by: As directed    Call MD for:  temperature >100.4   Complete by:  As directed    Diet - low sodium heart healthy   Complete by: As directed    Discharge instructions   Complete by: As directed    Please have your PCP check your Liver tests in 1 week. Please do not take your cholesterol medicine till cleared to do so by your PCP.  You were cared for by a hospitalist during your hospital stay. If you have any questions about your discharge medications or the care you received while you were in the hospital after you are discharged, you can call the unit and asked to speak with the hospitalist on call if the hospitalist that took care of you is not available. Once you are discharged, your primary care physician will handle any further medical issues. Please note that NO REFILLS for any discharge medications will be authorized once you are discharged, as it is imperative that you return to your primary care physician (or establish a relationship with a primary care physician if you do not have one) for your aftercare needs so that they can reassess your need for medications and monitor your lab values. If you do not have a primary care physician, you can call [phone] for a physician referral.   Increase activity slowly   Complete by: As directed          Allergies as of 05/04/2020   No Known Allergies     Medication List    STOP taking these medications   hydrochlorothiazide 25 MG tablet Commonly known as: HYDRODIURIL   pravastatin 20 MG tablet Commonly known as: PRAVACHOL     TAKE these medications   amLODipine 10 MG tablet Commonly known as: NORVASC Take 1 tablet (10 mg total) by mouth daily.   aspirin-sod bicarb-citric acid 325 MG Tbef tablet Commonly known as: ALKA-SELTZER Take 325 mg by mouth once.   folic acid 1 MG tablet Commonly known as: FOLVITE Take 1 tablet (1 mg total) by mouth daily.   Odefsey 200-25-25 MG Tabs tablet Generic drug: emtricitabine-rilpivir-tenofovir AF Take 1 tablet by mouth daily with breakfast.   polyethylene  glycol 17 g packet Commonly known as: MIRALAX / GLYCOLAX Take 17 g by mouth daily.   thiamine 100 MG tablet Take 1 tablet (100 mg total) by mouth daily.         Follow-up Information    Zehr, Princella Pellegrini, PA-C Follow up on 05/22/2020.   Specialty: Gastroenterology Why: 10:00AM Contact information: [address] [place] [address] [phone]        Daiva Eves, Lisette Grinder, MD. Schedule an appointment as soon as possible for a visit in 1 week(s).   Specialty: Infectious Diseases Contact information: [address] [place] [address] [phone]               TOTAL DISCHARGE TIME: 35 minutes  Tamico Mundo Rito Ehrlich  [facility] Pager on [email or web address]  05/05/2020, 12:13 PM

## 2020-05-04 NOTE — Progress Notes (Addendum)
Attending physician's note   I have taken an interval history, reviewed the chart and discussed on rounds. I agree with the Advanced Practitioner's note, impression, and recommendations as outlined.  Plan for outpatient follow-up as outlined.  GI service will otherwise sign off at this time.  Please do not hesitate to contact us with additional questions or concerns.  Hashir Deleeuw, DO, FACG [phone] office              Gastroenterology Progress Note  CC:  Abdominal pain and elevated LFTs  Subjective: Feels good.  Says that her stomach is growling and she would like to eat.    Objective:  Vital signs in last 24 hours: Temp:  [98.2 F (36.8 C)-99.5 F (37.5 C)] 98.2 F (36.8 C) (04/01 0342) Pulse Rate:  [68-79] 68 (04/01 0342) Resp:  [16-18] 16 (04/01 0342) BP: (103-137)/(80-94) 121/80 (04/01 0342) SpO2:  [99 %-100 %] 99 % (04/01 0342) Last BM Date: 05/03/20 General:  Alert, Well-developed, in NAD Heart:  Regular rate and rhythm; no murmurs Pulm:  CTAB.  No W/R/R. Abdomen:  Soft, non-distended.  BS present.  Non-tender. Extremities:  Without edema. Neurologic:  Alert and oriented x 4;  grossly normal neurologically. Psych:  Alert and cooperative. Normal mood and affect.  Intake/Output from previous day: 03/31 0701 - 04/01 0700 In: 1226.8 [I.V.:1226.8] Out: -   Lab Results: Recent Labs    05/02/20 0814 05/03/20 0303 05/04/20 0317  WBC 2.6* 2.4* 2.6*  HGB 12.9 12.7 11.7*  HCT 37.0 36.4 34.4*  PLT 255 229 217   BMET Recent Labs    05/02/20 0814 05/03/20 0303 05/04/20 0317  NA 134* 129* 133*  K 3.9 3.0* 4.5  CL 100 96* 107  CO2 24 20* 21*  GLUCOSE 111* 124* 117*  BUN 13 8 8   CREATININE 0.91 0.83 0.78  CALCIUM 8.4* 8.7* 8.4*   LFT Recent Labs    05/04/20 0317  PROT 6.5  ALBUMIN 3.5  AST 195*  ALT 147*  ALKPHOS 95  BILITOT 0.6   Hepatitis Panel Recent Labs    05/03/20 0303  HEPBSAG NON REACTIVE  HCVAB NON REACTIVE   HEPAIGM NON REACTIVE  HEPBIGM NON REACTIVE    MR 3D Recon At Scanner  Result Date: 05/02/2020 CLINICAL DATA:  Right upper quadrant abdominal pain, nondiagnostic ultrasound, dilated CBD EXAM: MRI ABDOMEN WITHOUT AND WITH CONTRAST (INCLUDING MRCP) TECHNIQUE: Multiplanar multisequence MR imaging of the abdomen was performed both before and after the administration of intravenous contrast. Heavily T2-weighted images of the biliary and pancreatic ducts were obtained, and three-dimensional MRCP images were rendered by post processing. CONTRAST:  96mL GADAVIST GADOBUTROL 1 MMOL/ML IV SOLN COMPARISON:  Ultrasound 05/02/2020 FINDINGS: Lower chest: Lung bases are grossly clear within the limitations of MR imaging. Normal cardiac size. No pericardial effusion. Hepatobiliary: No visible focal liver lesion. There is diffuse hepatic signal dropout with more geographic signal in the posterior right lobe liver which is fairly typical for findings of hepatic steatosis. Regions of focal fatty sparing seen along the gallbladder fossa. No concerning focal liver lesion is evident. Smooth liver surface contour. No significant intrahepatic biliary ductal dilatation. The extrahepatic common bile duct is mildly dilated to 8 mm though is certainly less conspicuous than on the comparison ultrasound imaging. No visible intraductal filling defects are identified. Normal appearance of the gallbladder without visible filling defect/gallstones. Accounting for some mild motion artifact, no significant postcontrast enhancement of the gallbladder wall. Pancreas: Smooth tapering of the distal  common bile duct and proximal pancreatic duct at the level of the pancreatic head. No significant pancreatic ductal dilatation is seen. Uniform pancreatic enhancement without visible lesion. No peripancreatic fluid or inflammation. Spleen:  Normal in size. No concerning splenic lesions. Adrenals/Urinary Tract: Normal adrenal glands. Kidneys are normally  located with symmetric enhancement. No suspicious renal lesion. No hydronephrosis. Stomach/Bowel: Visualized portions within the abdomen are unremarkable. Vascular/Lymphatic: No pathologically enlarged lymph nodes identified. No abdominal aortic aneurysm demonstrated. Other:  None. Musculoskeletal: No suspicious bone lesions or abnormal marrow signal. Grossly normal cord signal within the imaged levels. IMPRESSION: 1. The extrahepatic common bile duct is mildly dilated to 8 mm without visible obstructing stone and with smooth distal tapering. Appearance is certainly less conspicuous than on comparison ultrasound imaging. No visible intraductal filling defects are identified. 2. Normal appearance of the gallbladder without evidence of cholelithiasis or MR features of acute cholecystitis. 3. Hepatic steatosis. Electronically Signed   By: Kreg Shropshire M.D.   On: 05/02/2020 22:41   NM Hepato W/EF  Result Date: 05/03/2020 CLINICAL DATA:  Upper abdominal pain with nausea and vomiting EXAM: NUCLEAR MEDICINE HEPATOBILIARY IMAGING WITH GALLBLADDER EF VIEWS: Anterior right upper quadrant RADIOPHARMACEUTICALS:  5.5 mCi Tc-24m  Choletec IV COMPARISON:  Ultrasound right upper quadrant May 01, 2020 FINDINGS: Liver uptake of radiotracer is unremarkable. There is visualization of gallbladder and small bowel, indicating patency of the cystic and common bile ducts. The patient consumed 8 ounces of Ensure orally with calculation of the computer generated ejection fraction of radiotracer the gallbladder. The patient did not experience clinical symptoms with the oral Ensure consumption. The computer generated ejection fraction of radiotracer the gallbladder is normal at 56%, normal greater than 33% using the oral agent. IMPRESSION: Study within normal limits. Electronically Signed   By: Bretta Bang III M.D.   On: 05/03/2020 17:25   MR ABDOMEN MRCP W WO CONTAST  Result Date: 05/02/2020 CLINICAL DATA:  Right upper quadrant  abdominal pain, nondiagnostic ultrasound, dilated CBD EXAM: MRI ABDOMEN WITHOUT AND WITH CONTRAST (INCLUDING MRCP) TECHNIQUE: Multiplanar multisequence MR imaging of the abdomen was performed both before and after the administration of intravenous contrast. Heavily T2-weighted images of the biliary and pancreatic ducts were obtained, and three-dimensional MRCP images were rendered by post processing. CONTRAST:  50mL GADAVIST GADOBUTROL 1 MMOL/ML IV SOLN COMPARISON:  Ultrasound 05/02/2020 FINDINGS: Lower chest: Lung bases are grossly clear within the limitations of MR imaging. Normal cardiac size. No pericardial effusion. Hepatobiliary: No visible focal liver lesion. There is diffuse hepatic signal dropout with more geographic signal in the posterior right lobe liver which is fairly typical for findings of hepatic steatosis. Regions of focal fatty sparing seen along the gallbladder fossa. No concerning focal liver lesion is evident. Smooth liver surface contour. No significant intrahepatic biliary ductal dilatation. The extrahepatic common bile duct is mildly dilated to 8 mm though is certainly less conspicuous than on the comparison ultrasound imaging. No visible intraductal filling defects are identified. Normal appearance of the gallbladder without visible filling defect/gallstones. Accounting for some mild motion artifact, no significant postcontrast enhancement of the gallbladder wall. Pancreas: Smooth tapering of the distal common bile duct and proximal pancreatic duct at the level of the pancreatic head. No significant pancreatic ductal dilatation is seen. Uniform pancreatic enhancement without visible lesion. No peripancreatic fluid or inflammation. Spleen:  Normal in size. No concerning splenic lesions. Adrenals/Urinary Tract: Normal adrenal glands. Kidneys are normally located with symmetric enhancement. No suspicious renal lesion. No hydronephrosis. Stomach/Bowel:  Visualized portions within the abdomen are  unremarkable. Vascular/Lymphatic: No pathologically enlarged lymph nodes identified. No abdominal aortic aneurysm demonstrated. Other:  None. Musculoskeletal: No suspicious bone lesions or abnormal marrow signal. Grossly normal cord signal within the imaged levels. IMPRESSION: 1. The extrahepatic common bile duct is mildly dilated to 8 mm without visible obstructing stone and with smooth distal tapering. Appearance is certainly less conspicuous than on comparison ultrasound imaging. No visible intraductal filling defects are identified. 2. Normal appearance of the gallbladder without evidence of cholelithiasis or MR features of acute cholecystitis. 3. Hepatic steatosis. Electronically Signed   By: Kreg Shropshire M.D.   On: 05/02/2020 22:41   Assessment / Plan: *2 weeks of upper abdominal discomfort with episodes of nausea and vomiting, poor appetite.AST and ALT elevated and ultrasound showed dilated common bile duct.MRCP essentially negative.  AST up to 439 and ALT 202 yesterday but down again today.  HIDA scan negative.  Acute viral hep panel and CMV negative.  Suspect non-specific viral illness. *Rectal bleeding:Sounds small-volume on a few occasions with straining to have a bowel movement. Has never had colonoscopy in the past.  Hgb normal/stable. *QMG:[id number]. Follows with infectious disease regularly. *Hyponatremia:  Na+ 133 today. *Hypokalemia:  Resolved.  K+ 4.5 *ETOH abuse:  Needs abstinence.  LCSW has seen her.  -I have placed her on a regular diet.  If she tolerates that then she can be discharged later today from a GI standpoint.  I have put a follow-up in our office in her discharge information.  At that time we will schedule colonoscopy. -Needs Miralax daily to keep stools soft and we discussed this again today. -Will follow-up results of remaining viral labs.    LOS: 2 days   Princella Pellegrini. Zehr  05/04/2020, 9:32 AM

## 2020-05-10 ENCOUNTER — Telehealth (INDEPENDENT_AMBULATORY_CARE_PROVIDER_SITE_OTHER): Payer: Self-pay | Admitting: Infectious Disease

## 2020-05-10 ENCOUNTER — Encounter: Payer: Self-pay | Admitting: Infectious Disease

## 2020-05-10 ENCOUNTER — Other Ambulatory Visit: Payer: Self-pay

## 2020-05-10 DIAGNOSIS — B20 Human immunodeficiency virus [HIV] disease: Secondary | ICD-10-CM

## 2020-05-10 DIAGNOSIS — K759 Inflammatory liver disease, unspecified: Secondary | ICD-10-CM | POA: Insufficient documentation

## 2020-05-10 DIAGNOSIS — I10 Essential (primary) hypertension: Secondary | ICD-10-CM

## 2020-05-10 HISTORY — DX: Inflammatory liver disease, unspecified: K75.9

## 2020-05-10 NOTE — Progress Notes (Signed)
Virtual Visit via Video Note  I connected with [name] on 05/10/20 at 10:15 AM EDT by a video enabled telemedicine application and verified that I am speaking with the correct person using two identifiers.  Location: Patient: Home  Provider: [facility]   I discussed the limitations of evaluation and management by telemedicine and the availability of in person appointments. The patient expressed understanding and agreed to proceed.  History of Present Illness:  Julia Molina is a 55 year old black woman living with HIV that has been perfectly controlled on Odefsey.  She was admitted to the hospital recently with nausea and vomiting and found to have elevated liver function test.  She was checked for viral hepatitis and these tests were negative.  She had a HIDA scan which was unremarkable.  Statin was discontinued.  Her went MRCP by GI pending.  Liver function tests improve there was thought that she might be suffering from a viral illness that was causing hepatitis she had CMV IgM checked which was negative and a viral capsid IgM that was checked also negative.  Liver function tests have improved.  She told me that she wonders if it could be due to alcohol though when she told me how much alcohol she drank she said she drank roughly 2 malt liquors per day she says she has now stopped drinking altogether.  She was frustrated with the cost of what she thought was a blood pressure medicine that is not covered by the HIV medication assistance program.  This turns out to be folic acid.  I note that her folic acid level was actually completely normal on 31 March so I do not know that she needs this anyway.  She is otherwise in good spirits.  She would like to come to the clinic on 14 April.  I am not available that day but I have booked her with Rexene Alberts she can have some labs the same day she also has follow-up with [facility] shortly thereafter  Past Medical History:  Diagnosis Date  .  Benign essential HTN 10/03/2015  . Bilateral hip pain 06/15/2017  . Hepatitis 05/10/2020  . High cholesterol   . HIV (human immunodeficiency virus infection) (HCC)   . Joint pain 06/15/2017  . Myalgia 06/15/2017  . Osteoarthritis 08/05/2016    Past Surgical History:  Procedure Laterality Date  . CESAREAN SECTION      No family history on file.    Social History   Socioeconomic History  . Marital status: Single    Spouse name: Not on file  . Number of children: Not on file  . Years of education: Not on file  . Highest education level: Not on file  Occupational History  . Not on file  Tobacco Use  . Smoking status: Former Smoker    Packs/day: 0.20    Types: Cigars    Start date: 12/06/1989  . Smokeless tobacco: Never Used  Vaping Use  . Vaping Use: Never used  Substance and Sexual Activity  . Alcohol use: No  . Drug use: Not Currently    Types: Marijuana  . Sexual activity: Not on file  Other Topics Concern  . Not on file  Social History Narrative  . Not on file   Social Determinants of Health   Financial Resource Strain: Not on file  Food Insecurity: Not on file  Transportation Needs: Not on file  Physical Activity: Not on file  Stress: Not on file  Social Connections: Not on  file    No Known Allergies   Current Outpatient Medications:  .  aspirin-sod bicarb-citric acid (ALKA-SELTZER) 325 MG TBEF tablet, Take 325 mg by mouth once., Disp: , Rfl:  .  emtricitabine-rilpivir-tenofovir AF (ODEFSEY) 200-25-25 MG TABS tablet, Take 1 tablet by mouth daily with breakfast., Disp: 30 tablet, Rfl: 5 .  folic acid (FOLVITE) 1 MG tablet, Take 1 tablet (1 mg total) by mouth daily., Disp: 30 tablet, Rfl: 0 .  polyethylene glycol (MIRALAX / GLYCOLAX) 17 g packet, Take 17 g by mouth daily., Disp: 30 each, Rfl: 0 .  thiamine 100 MG tablet, Take 1 tablet (100 mg total) by mouth daily., Disp: 30 tablet, Rfl: 0 .  amLODipine (NORVASC) 10 MG tablet, Take 1 tablet (10 mg total) by  mouth daily. (Patient not taking: Reported on 05/10/2020), Disp: 30 tablet, Rfl: 1   Observations/Objective:  Verle was in no acute distress appear to be doing well on video feed.    Assessment and Plan:  Hepatitis: Not clear what the cause of this was check a CMP when she comes back on the 14th  Hypertension continue amlodipine  Alcohol use: She is says she is stopped altogether can reassess in the clinic.  HIV disease continue Odefsey  Follow Up Instructions:    I discussed the assessment and treatment plan with the patient. The patient was provided an opportunity to ask questions and all were answered. The patient agreed with the plan and demonstrated an understanding of the instructions.   The patient was advised to call back or seek an in-person evaluation if the symptoms worsen or if the condition fails to improve as anticipated.  I spent greater than 30 minutes with the patient including greater than 50% of time in face to face counsel of the patient and in coordination of their care.    Acey Lav, MD

## 2020-05-16 ENCOUNTER — Other Ambulatory Visit (HOSPITAL_COMMUNITY): Payer: Self-pay

## 2020-05-16 ENCOUNTER — Telehealth: Payer: Self-pay

## 2020-05-16 NOTE — Telephone Encounter (Signed)
[facility] Patient Advocate Encounter ? ?Insurance verification completed.   ? ?The patient is uninsured and will need patient assistance for medication. ? ?We can complete the application and will need to meet with the patient for signatures and income documentation. ? ?Mancel Lardizabal, CPhT ?Specialty Pharmacy Patient Advocate ?[facility] ?Phone: [phone] ?Fax:  [phone]  ?

## 2020-05-17 ENCOUNTER — Encounter: Payer: Self-pay | Admitting: Infectious Diseases

## 2020-05-17 ENCOUNTER — Other Ambulatory Visit: Payer: Self-pay

## 2020-05-17 ENCOUNTER — Ambulatory Visit (INDEPENDENT_AMBULATORY_CARE_PROVIDER_SITE_OTHER): Payer: Self-pay | Admitting: Infectious Diseases

## 2020-05-17 VITALS — BP 109/74 | HR 82 | Temp 98.7°F | Ht 66.0 in | Wt 147.0 lb

## 2020-05-17 DIAGNOSIS — B20 Human immunodeficiency virus [HIV] disease: Secondary | ICD-10-CM

## 2020-05-17 DIAGNOSIS — R7401 Elevation of levels of liver transaminase levels: Secondary | ICD-10-CM

## 2020-05-17 MED ORDER — ODEFSEY 200-25-25 MG PO TABS: 1.0000 | ORAL_TABLET | Freq: Every day | ORAL | 5 refills | Status: DC

## 2020-05-17 NOTE — Assessment & Plan Note (Addendum)
The cause of her recent transaminitis is unclear.  Many of her viral studies were negative.  Acute hepatitis viral panel was also negative.  Possibility that this was due to alcohol. Ultrasound was unremarkable aside from some hepatic steatosis.  We did discuss dietary changes and alcohol limitation to improve this condition. She had no other findings of synthetic liver dysfunction during hospitalization and her total bilirubin count was normal.  She did have a acute drop with her white blood cell count potentially related to this current process. We will repeat CBC and CMP today.  Check syphilis for completeness.  Follow-up as currently scheduled with GI team.

## 2020-05-17 NOTE — Patient Instructions (Addendum)
Nice to meet you!  Please plan to come back in the next 4 months or so for a pap smear with me to update your cervical cancer screening. I can also help you with a mammogram as well.   Please stop by the lab on your way out to ensure your blood work matches you feeling better.   No medication changes today - I refilled your Odefsey.

## 2020-05-17 NOTE — Progress Notes (Signed)
Name: Julia Molina  DOB: [birth year] MRN: [id number] PCP: Julia Molina, Julia Grinder, MD   Subjective    Subjective:   Chief Complaint  Patient presents with  . Follow-up    Declined condoms      HPI: [name] is here for follow-up after recent hospitalization due to hepatitis of unknown cause.  She says she feels much better with complete resolution of any symptoms she had at that time.  She feels like it must of been related to the alcohol use she had prior to hospitalization.  She has not had any alcohol since discharge.  We gained a little weight.  Eating and drinking well and sleeping okay.  She has a follow-up with GI later this week.  Been a while since she had a Pap smear, no history of previous abnormal results.  No sexual partners for many years.  She had her third dose of Pfizer vaccine in November 2021 and is not interested in any further shots at this time.   Depression screen [facility] 2/9 05/17/2020  Decreased Interest 0  Down, Depressed, Hopeless 0  PHQ - 2 Score 0    Review of Systems  Constitutional: Negative for chills and fever.  HENT: Negative for tinnitus.   Eyes: Negative for blurred vision and photophobia.  Respiratory: Negative for cough and sputum production.   Cardiovascular: Negative for chest pain.  Gastrointestinal: Negative for diarrhea, nausea and vomiting.  Genitourinary: Negative for dysuria.  Skin: Negative for rash.  Neurological: Negative for headaches.  Psychiatric/Behavioral: Negative for depression and substance abuse.    Past Medical History:  Diagnosis Date  . Benign essential HTN 10/03/2015  . Bilateral hip pain 06/15/2017  . Hepatitis 05/10/2020  . High cholesterol   . HIV (human immunodeficiency virus infection) (HCC)   . Joint pain 06/15/2017  . Myalgia 06/15/2017  . Osteoarthritis 08/05/2016    Outpatient Medications Prior to Visit  Medication Sig Dispense Refill  . amLODipine (NORVASC) 10 MG tablet Take 1 tablet (10 mg total) by mouth  daily. 30 tablet 1  . Multiple Vitamin (MULTIVITAMIN ADULT PO) Take by mouth.    . polyethylene glycol (MIRALAX / GLYCOLAX) 17 g packet Take 17 g by mouth daily. 30 each 0  . emtricitabine-rilpivir-tenofovir AF (ODEFSEY) 200-25-25 MG TABS tablet Take 1 tablet by mouth daily with breakfast. 30 tablet 5  . aspirin-sod bicarb-citric acid (ALKA-SELTZER) 325 MG TBEF tablet Take 325 mg by mouth once. (Patient not taking: Reported on 05/17/2020)    . folic acid (FOLVITE) 1 MG tablet Take 1 tablet (1 mg total) by mouth daily. (Patient not taking: Reported on 05/17/2020) 30 tablet 0  . thiamine 100 MG tablet Take 1 tablet (100 mg total) by mouth daily. 30 tablet 0   No facility-administered medications prior to visit.     No Known Allergies  Social History   Tobacco Use  . Smoking status: Former Smoker    Packs/day: 0.20    Types: Cigars    Start date: 12/06/1989  . Smokeless tobacco: Never Used  . Tobacco comment: black n' milds occ   Vaping Use  . Vaping Use: Never used  Substance Use Topics  . Alcohol use: Not Currently  . Drug use: Not Currently    Types: Marijuana     Social History   Substance and Sexual Activity  Sexual Activity Not on file     Objective:   Vitals:   05/17/20 1023  BP: 109/74  Pulse: 82  Temp:  98.7 F (37.1 C)  TempSrc: Oral  SpO2: 98%  Weight: 147 lb (66.7 kg)  Height: 5\' 6"  (1.676 m)   Body mass index is 23.73 kg/m.  Physical Exam Vitals reviewed.  Pulmonary:     Effort: Pulmonary effort is normal. No respiratory distress.  Abdominal:     General: There is no distension.     Palpations: Abdomen is soft.     Tenderness: There is no abdominal tenderness.  Skin:    General: Skin is warm and dry.     Capillary Refill: Capillary refill takes less than 2 seconds.  Neurological:     Mental Status: She is oriented to person, place, and time.  Psychiatric:        Mood and Affect: Mood normal.        Behavior: Behavior normal.        Thought  Content: Thought content normal.        Judgment: Judgment normal.     Lab Results Lab Results  Component Value Date   WBC 2.6 (L) 05/04/2020   HGB 11.7 (L) 05/04/2020   HCT 34.4 (L) 05/04/2020   MCV 89.6 05/04/2020   PLT 217 05/04/2020    Lab Results  Component Value Date   CREATININE 0.78 05/04/2020   BUN 8 05/04/2020   NA 133 (L) 05/04/2020   K 4.5 05/04/2020   CL 107 05/04/2020   CO2 21 (L) 05/04/2020    Lab Results  Component Value Date   ALT 147 (H) 05/04/2020   AST 195 (H) 05/04/2020   ALKPHOS 95 05/04/2020   BILITOT 0.6 05/04/2020    Lab Results  Component Value Date   CHOL 232 (H) 03/16/2019   HDL 69 03/16/2019   LDLCALC 133 (H) 03/16/2019   TRIG 163 (H) 03/16/2019   CHOLHDL 3.4 03/16/2019   HIV 1 RNA Quant (copies/mL)  Date Value  03/16/2019 <20 DETECTED (A)  01/20/2018 <20 NOT DETECTED  06/02/2017 <20 DETECTED (A)   CD4 T Cell Abs (/uL)  Date Value  03/16/2019 1,083  01/20/2018 1,020  06/02/2017 1,130     Assessment & Plan:   Problem List Items Addressed This Visit      Unprioritized   Transaminitis - Primary    The cause of her recent transaminitis is unclear.  Many of her viral studies were negative.  Acute hepatitis viral panel was also negative.  Possibility that this was due to alcohol. Ultrasound was unremarkable aside from some hepatic steatosis.  We did discuss dietary changes and alcohol limitation to improve this condition. She had no other findings of synthetic liver dysfunction during hospitalization and her total bilirubin count was normal.  She did have a acute drop with her white blood cell count potentially related to this current process. We will repeat CBC and CMP today.  Check syphilis for completeness.  Follow-up as currently scheduled with GI team.      Relevant Orders   COMPLETE METABOLIC PANEL WITH GFR   HIV disease (HCC)    She has had historically perfect control of her HIV with viral load undetectable and CD4  count over thousand in February 2021.  She is continue to take her Odefsey correctly every day with food.  We will update pertinent labs today. We will get her back for a Pap smear in the next 4 months or so.  Last study done in 2015 with normal cytology negative HPV.  No history of previous abnormal results, will help with mammogram at  that visit too.      Relevant Medications   emtricitabine-rilpivir-tenofovir AF (ODEFSEY) 200-25-25 MG TABS tablet   Other Relevant Orders   HIV-1 RNA quant-no reflex-bld   T-helper cell (CD4)- ([facility] clinic only)   RPR   CBC      Rexene Alberts, MSN, NP-C [facility] Pager: [phone] Office: [phone]  05/17/20  11:08 AM

## 2020-05-17 NOTE — Assessment & Plan Note (Signed)
She has had historically perfect control of her HIV with viral load undetectable and CD4 count over thousand in February 2021.  She is continue to take her Odefsey correctly every day with food.  We will update pertinent labs today. We will get her back for a Pap smear in the next 4 months or so.  Last study done in 2015 with normal cytology negative HPV.  No history of previous abnormal results, will help with mammogram at that visit too.

## 2020-05-18 LAB — T-HELPER CELL (CD4) - (RCID CLINIC ONLY)
CD4 % Helper T Cell: 48 % (ref 33–65)
CD4 T Cell Abs: 818 /uL (ref 400–1790)

## 2020-05-20 LAB — COMPLETE METABOLIC PANEL WITH GFR
AG Ratio: 1.4 (calc) (ref 1.0–2.5)
ALT: 37 U/L — ABNORMAL HIGH (ref 6–29)
AST: 35 U/L (ref 10–35)
Albumin: 4.2 g/dL (ref 3.6–5.1)
Alkaline phosphatase (APISO): 77 U/L (ref 37–153)
BUN: 10 mg/dL (ref 7–25)
CO2: 22 mmol/L (ref 20–32)
Calcium: 9.4 mg/dL (ref 8.6–10.4)
Chloride: 108 mmol/L (ref 98–110)
Creat: 0.85 mg/dL (ref 0.50–1.05)
GFR, Est African American: 90 mL/min/{1.73_m2} (ref >=60)
GFR, Est Non African American: 78 mL/min/{1.73_m2} (ref >=60)
Globulin: 3.1 g/dL (calc) (ref 1.9–3.7)
Glucose, Bld: 100 mg/dL — ABNORMAL HIGH (ref 65–99)
Potassium: 4.1 mmol/L (ref 3.5–5.3)
Sodium: 140 mmol/L (ref 135–146)
Total Bilirubin: 0.3 mg/dL (ref 0.2–1.2)
Total Protein: 7.3 g/dL (ref 6.1–8.1)

## 2020-05-20 LAB — CBC
HCT: 36.7 % (ref 35.0–45.0)
Hemoglobin: 12.3 g/dL (ref 11.7–15.5)
MCH: 29.8 pg (ref 27.0–33.0)
MCHC: 33.5 g/dL (ref 32.0–36.0)
MCV: 88.9 fL (ref 80.0–100.0)
MPV: 9 fL (ref 7.5–12.5)
Platelets: 415 Thousand/uL — ABNORMAL HIGH (ref 140–400)
RBC: 4.13 Million/uL (ref 3.80–5.10)
RDW: 14.4 % (ref 11.0–15.0)
WBC: 5.6 Thousand/uL (ref 3.8–10.8)

## 2020-05-20 LAB — HIV-1 RNA QUANT-NO REFLEX-BLD
HIV 1 RNA Quant: <20 {copies}/mL — ABNORMAL HIGH
HIV-1 RNA Quant, Log: <1.3 {Log_copies}/mL — ABNORMAL HIGH

## 2020-05-20 LAB — RPR: RPR Ser Ql: NONREACTIVE

## 2020-05-21 ENCOUNTER — Telehealth: Payer: Self-pay

## 2020-05-21 NOTE — Telephone Encounter (Signed)
-----   Message from Blanchard Kelch, NP sent at 05/21/2020  9:53 AM EDT ----- Please call Julia Molina to let her know that her liver function tests are almost all now back to normal - only one that is a few points out of range. This is excellent.  Her viral load is undetectable and immune system continues to look good.   Please have her follow up with Dr Daiva Eves in 4 months to check in with her liver again.

## 2020-05-21 NOTE — Telephone Encounter (Signed)
RN spoke to patient to relay that per Rexene Alberts, NP her liver function tests are almost back to normal, that there is just one slightly out of range. Advised her that her viral load is undetectable and her immune system continues to look good. Patient verbalized understanding and has no further questions.   Patient scheduled for follow up with Dr. Daiva Eves 09/27/20.    Sandie Ano, RN

## 2020-05-22 ENCOUNTER — Ambulatory Visit: Payer: Self-pay | Admitting: Gastroenterology

## 2020-08-01 ENCOUNTER — Other Ambulatory Visit: Payer: Self-pay | Admitting: Family

## 2020-08-01 DIAGNOSIS — B20 Human immunodeficiency virus [HIV] disease: Secondary | ICD-10-CM

## 2020-09-12 ENCOUNTER — Telehealth: Payer: Self-pay

## 2020-09-12 NOTE — Telephone Encounter (Signed)
Patient called requesting Blood Pressure medication, said she was recently  at [facility] long and the medication Dr. Daiva Eves had her on was replace with a different one. Patient could not tell me the medication name and just said it should be in her chart, pharmacy [facility] at [phone] [facility] specialty. Would like a return call at [phone]

## 2020-09-12 NOTE — Telephone Encounter (Signed)
Patient is requesting if you could refill her amlodipine. I have also suggested to the patient to get established with a pcp.

## 2020-09-13 ENCOUNTER — Other Ambulatory Visit: Payer: Self-pay

## 2020-09-13 MED ORDER — AMLODIPINE BESYLATE 10 MG PO TABS: 10.0000 mg | ORAL_TABLET | Freq: Every day | ORAL | 11 refills | Status: DC

## 2020-09-13 NOTE — Telephone Encounter (Signed)
I have attempted to contact the patient to let her know her amlodipine  has been sent to the pharmacy and Dr. Clinton Gallant recommendation is that patient establish with a pcp. Patient's appointments on 09/27/20 will also need to be adjusted. Patient can not have her pap and visit with Dr. Daiva Eves on the same day. So it will probably be best for her to reschedule the pap with [facility].  Pachia Strum T Pricilla Loveless

## 2020-09-13 NOTE — Telephone Encounter (Signed)
Patient informed that amlodipine has been sent to her pharmacy and that she will need to establish with a pcp.  I have also pushed patient's pap appointment out as well and patient is aware.

## 2020-09-27 ENCOUNTER — Ambulatory Visit: Payer: Self-pay | Admitting: Infectious Disease

## 2020-09-27 ENCOUNTER — Ambulatory Visit: Payer: Self-pay | Admitting: Infectious Diseases

## 2020-10-24 ENCOUNTER — Ambulatory Visit: Payer: Self-pay | Admitting: Infectious Diseases

## 2020-10-24 ENCOUNTER — Telehealth: Payer: Self-pay

## 2020-10-24 DIAGNOSIS — Z113 Encounter for screening for infections with a predominantly sexual mode of transmission: Secondary | ICD-10-CM | POA: Insufficient documentation

## 2020-10-24 DIAGNOSIS — R21 Rash and other nonspecific skin eruption: Secondary | ICD-10-CM | POA: Insufficient documentation

## 2020-10-24 DIAGNOSIS — Z124 Encounter for screening for malignant neoplasm of cervix: Secondary | ICD-10-CM | POA: Insufficient documentation

## 2020-10-24 NOTE — Assessment & Plan Note (Deleted)
Routine cervicovaginal swab for screening today.

## 2020-10-24 NOTE — Assessment & Plan Note (Deleted)
Unexplained episode back in March 2022 with spontaneous resolution.  Repeat LFTs today for monitoring.

## 2020-10-24 NOTE — Assessment & Plan Note (Deleted)
S/p menopausal woman with well controlled HIV on Odefsey, sexually active with female partners, condom use +. Normal pelvic exam.   Cervical brushing collected for cytology and HPV.  Discussed recommended screening interval for women living with HIV disease meant to be lifelong and at an interval of Q1-3 years pending results. Acceptable to space out Q3y with 3 consecutively normal exams. Further recommendations for Julia Molina to follow today's results. Results will be communicated to the patient via mychart.

## 2020-10-24 NOTE — Telephone Encounter (Signed)
Patient called to report a rash on the palms of her hands. Patient is concerned about Monkeypox. Doesn't report being exposed and declines having unprotected sex recently. Patient is scheduled for a visit with Rexene Alberts, NP for a Pap smear today. Will forward to her to make aware and possibly evaluate.   Conchita Truxillo Loyola Mast, RN

## 2020-10-24 NOTE — Assessment & Plan Note (Deleted)
New problem -  RPR, HMPX testing done today.

## 2020-10-24 NOTE — Progress Notes (Deleted)
Subjective :   No chief complaint on file.     Julia Molina is a 55 y.o. female here for gynecologic exam for cervical cancer screening.   Called earlier today to report a new rash on the palms of her hands that she was concerned may be human monkeypox rash. No known exposure and no unprotected sex recently - last sexual encounter ***.   Missed her last routine HIV OV with Dr. Daiva Eves last month. Has been taking her Odefsey regularly without missed doses.   Review of Systems: Current GYN complaints or concerns: ***.   Patient denies any abdominal/pelvic pain, problems with bowel movements, urination, vaginal discharge or intercourse.   Past Medical History:  Diagnosis Date   Benign essential HTN 10/03/2015   Bilateral hip pain 06/15/2017   Hepatitis 05/10/2020   High cholesterol    HIV (human immunodeficiency virus infection) (HCC)    Joint pain 06/15/2017   Myalgia 06/15/2017   Osteoarthritis 08/05/2016    Gynecologic History: No obstetric history on file.  Patient's last menstrual period was 06/14/2012. Contraception: post menopausal status Last Pap: 2015. Results were: normal Anal Intercourse: {yes/no} Last Mammogram: ***. Results were: {norm/abn:16337}    Objective :   Physical Exam - chaperone present  Constitutional: Well developed, well nourished, no acute distress. She is alert and oriented x3.  Pelvic: External genitalia is normal in appearance. The vagina is normal in appearance. The cervix is bulbous and easily visualized. No CMT, normal expected cervical mucus present. Bimanual exam reveals uterus that is felt to be normal size, shape, and contour. No adnexal masses or tenderness noted. Breasts: symmetrical in contour, shape and texture. No palpable masses/nodules. No nipple discharge.  Psych: She has a normal mood and affect.      Assessment & Plan:    Patient Active Problem List   Diagnosis Date Noted   Screening for cervical cancer 10/24/2020    Rash of both hands 10/24/2020   Routine screening for STI (sexually transmitted infection) 10/24/2020   Abdominal pain, epigastric    RUQ abdominal pain 05/02/2020   Hypokalemia 05/02/2020   Hyponatremia 05/02/2020   Nausea & vomiting 05/02/2020   Transaminitis 05/02/2020   Myalgia 06/15/2017   Joint pain 06/15/2017   Bilateral hip pain 06/15/2017   Osteoarthritis 08/05/2016   Benign essential HTN 10/03/2015   Major depression, chronic 02/28/2014   Unspecified vitamin D deficiency 03/25/2012   Boils 03/24/2012   Hyperlipidemia 01/07/2012   Smoker 07/02/2011   Amenorrhea 03/05/2011   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  Repeat LFTs today for monitoring.       Screening for cervical cancer    S/p menopausal woman with well controlled HIV on Odefsey, sexually active with female partners, condom use +. Normal pelvic exam.   Cervical brushing collected for cytology and HPV.  Discussed recommended screening interval for women living with HIV disease meant to be lifelong and at an interval of Q1-3 years pending results. Acceptable to space out Q3y with 3 consecutively normal exams. Further recommendations for Julia Molina to follow today's results. Results will be communicated to the patient via mychart.       Routine screening for STI (  sexually transmitted infection)    Routine cervicovaginal swab for screening today.       Rash of both hands - Primary    New problem -  RPR, HMPX testing done today.       HIV disease (HCC)    Missed routine care visit follow up.  Adherence check reveals she has been doing well on Odefsey, taking it correctly with chewable meal and avoiding PPI's/acid reducers.  Update VL today. STI  screening, pap smear today.  Offered flu shot -   RTC in 24m to see Dr. Daiva Eves          Rexene Alberts, MSN, NP-C [facility] for Infectious Disease [facility] Office: [phone] Pager: [phone]  10/24/20 10:20 AM

## 2020-10-24 NOTE — Assessment & Plan Note (Deleted)
Missed routine care visit follow up.  Adherence check reveals she has been doing well on Odefsey, taking it correctly with chewable meal and avoiding PPI's/acid reducers.  Update VL today. STI screening, pap smear today.  Offered flu shot -   RTC in 69m to see Dr. Daiva Eves

## 2021-03-18 ENCOUNTER — Other Ambulatory Visit: Payer: Self-pay

## 2021-03-18 ENCOUNTER — Emergency Department (HOSPITAL_BASED_OUTPATIENT_CLINIC_OR_DEPARTMENT_OTHER): Payer: Self-pay

## 2021-03-18 ENCOUNTER — Emergency Department (HOSPITAL_BASED_OUTPATIENT_CLINIC_OR_DEPARTMENT_OTHER)
Admission: EM | Admit: 2021-03-18 | Discharge: 2021-03-18 | Disposition: A | Discharge status: Home / Self Care | Payer: Self-pay | Attending: Emergency Medicine | Admitting: Emergency Medicine

## 2021-03-18 ENCOUNTER — Encounter (HOSPITAL_BASED_OUTPATIENT_CLINIC_OR_DEPARTMENT_OTHER): Payer: Self-pay | Admitting: *Deleted

## 2021-03-18 DIAGNOSIS — Z21 Asymptomatic human immunodeficiency virus [HIV] infection status: Secondary | ICD-10-CM | POA: Insufficient documentation

## 2021-03-18 DIAGNOSIS — Z7982 Long term (current) use of aspirin: Secondary | ICD-10-CM | POA: Insufficient documentation

## 2021-03-18 DIAGNOSIS — U071 COVID-19: Secondary | ICD-10-CM | POA: Insufficient documentation

## 2021-03-18 LAB — RESP PANEL BY RT-PCR (FLU A&B, COVID) ARPGX2
Influenza A by PCR: NEGATIVE
Influenza B by PCR: NEGATIVE
SARS Coronavirus 2 by RT PCR: POSITIVE — AB

## 2021-03-18 MED ORDER — BENZONATATE 100 MG PO CAPS
200.0000 mg | ORAL_CAPSULE | Freq: Once | ORAL | Status: AC
  Administered 2021-03-18: 200 mg via ORAL
  Filled 2021-03-18: qty 2

## 2021-03-18 NOTE — ED Provider Notes (Signed)
[facility] EMERGENCY DEPARTMENT Provider Note   CSN: [id number] Arrival date & time: 03/18/21  1005     History  Chief Complaint  Patient presents with   Cough    Julia Molina is a 56 y.o. female with a past medical history of HIV status presenting today with complaint of a cough.  She reports that for the week she has been having some cough occasionally productive but usually "rough and dry."  Continues to smoke cigarettes.  Reports that her HIV has been undetectable and CD4 counts have been normal.  She works in [employer] and was told that she needed to come get checked for pneumonia before she returned to work.  Denies any fevers, chills, leg swelling, history of DVT, recent surgery or travel.   Cough     Home Medications Prior to Admission medications   Medication Sig Start Date End Date Taking? Authorizing Provider  amLODipine (NORVASC) 10 MG tablet Take 1 tablet (10 mg total) by mouth daily. 09/13/20   Randall Hiss, MD  aspirin-sod bicarb-citric acid (ALKA-SELTZER) 325 MG TBEF tablet Take 325 mg by mouth once. Patient not taking: Reported on 05/17/2020    [provider]  emtricitabine-rilpivir-tenofovir AF (ODEFSEY) 200-25-25 MG TABS tablet Take 1 tablet by mouth daily with breakfast. 05/17/20   Blanchard Kelch, NP  Multiple Vitamin (MULTIVITAMIN ADULT PO) Take by mouth.    [provider]  polyethylene glycol (MIRALAX / GLYCOLAX) 17 g packet Take 17 g by mouth daily. 05/04/20   Osvaldo Shipper, MD  thiamine 100 MG tablet Take 1 tablet (100 mg total) by mouth daily. 05/05/20   Osvaldo Shipper, MD      Allergies    Patient has no known allergies.    Review of Systems   Review of Systems  Respiratory:  Positive for cough.   See HPI Physical Exam Updated Vital Signs BP (!) 136/94 (BP Location: Right Arm)    Pulse 90    Temp 98.7 F (37.1 C) (Oral)    Resp 18    Ht 5\' 6"  (1.676 m)    Wt 70.3 kg    LMP 06/14/2012    SpO2 99%    BMI  25.02 kg/m  Physical Exam Vitals and nursing note reviewed.  Constitutional:      Appearance: Normal appearance. She is not ill-appearing.  HENT:     Head: Normocephalic and atraumatic.     Mouth/Throat:     Mouth: Mucous membranes are moist.     Pharynx: Oropharynx is clear.  Eyes:     General: No scleral icterus.    Conjunctiva/sclera: Conjunctivae normal.  Cardiovascular:     Rate and Rhythm: Normal rate and regular rhythm.  Pulmonary:     Effort: Pulmonary effort is normal. No respiratory distress.     Breath sounds: No wheezing.  Skin:    General: Skin is warm and dry.     Findings: No rash.  Neurological:     Mental Status: She is alert.  Psychiatric:        Mood and Affect: Mood normal.    ED Results / Procedures / Treatments   Labs (all labs ordered are listed, but only abnormal results are displayed) Labs Reviewed  RESP PANEL BY RT-PCR (FLU A&B, COVID) ARPGX2    EKG None  Radiology DG Chest 2 View  Result Date: 03/18/2021 CLINICAL DATA:  2 day history of cough. EXAM: CHEST - 2 VIEW COMPARISON:  05/01/2020 FINDINGS:  The lungs are clear without focal pneumonia, edema, pneumothorax or pleural effusion. The cardiopericardial silhouette is within normal limits for size. The visualized bony structures of the thorax show no acute abnormality. IMPRESSION: No active cardiopulmonary disease. Electronically Signed   By: Kennith Center M.D.   On: 03/18/2021 10:47    Procedures Procedures    Medications Ordered in ED Medications - No data to display  ED Course/ Medical Decision Making/ A&P                           Medical Decision Making Amount and/or Complexity of Data Reviewed Radiology: ordered.  Risk Prescription drug management.   57 year old female with a past medical history of HIV presenting due to cough.  Differential includes but is not limited to pulmonary embolus, pneumonia, pulmonary edema, viral or bacterial URI or strep throat.  Co morbidities  that complicate the patient evaluation include: HIV, smoking history  Additional history obtained from internal and external records: Well-controlled HIV status   Workup:  Imaging Studies ordered:  I ordered a chest x-ray.  This was reviewed by me and I agree with the radiologist that there are no findings.  3. Cardiac Monitoring:  The patient was maintained on a cardiac monitor.  I personally viewed and interpreted the cardiac monitored which showed a sustained normal sinus rhythm with a normal rate.  4. Test Considered: D-dimer however patient is low likelihood [place]  Was COVID swabbed and this was positive.   Treatment:  Medications:  I ordered medication including 1 dose of Tessalon for her cough.  Due to her insurance, she would prefer to treat with over-the-counter remedies because all she has used thus far as Tylenol.  Dispo:  Problem List / ED Course:  COVID-19 social determinants of health include: Poor insurance coverage   Dispostion:  I reevaluated the patient and she is still hemodynamically stable.  I believe she will benefit from over-the-counter use of Mucinex, Delsym or any other cold/flu medication that helps her symptoms.  Return precautions were discussed and she has been given a work note. To be discharged in good condition.   Final Clinical Impression(s) / ED Diagnoses Final diagnoses:  COVID-19    Rx / DC Orders Results and diagnoses were explained to the patient. Return precautions discussed in full. Patient had no additional questions and expressed complete understanding.   This chart was dictated using voice recognition software.  Despite best efforts to proofread,  errors can occur which can change the documentation meaning.    Woodroe Chen 03/18/21 1226    Milagros Loll, MD 03/20/21 [phone]

## 2021-03-18 NOTE — ED Triage Notes (Signed)
For the past week having body aches, HA and now has cough. Having non-prod cough. Is concerned due to being HIV positive would like to be evaluated.

## 2021-03-18 NOTE — ED Notes (Signed)
Patient given discharge instructions. Questions were answered. Patient verbalized understanding of discharge instructions and care at home.  

## 2021-03-18 NOTE — Discharge Instructions (Addendum)
You tested positive for COVID today.  You have a work note for the next 5 days.  Delsym is a good option for your cough.  Do your best to quit smoking.

## 2021-03-28 ENCOUNTER — Telehealth: Payer: Self-pay

## 2021-03-28 NOTE — Telephone Encounter (Signed)
Patient currenty working with [place]. Staff unable to reach patient by phone to schedule a visit and check on patient with recent covid positive results. Staff will reach out to [facility] to for assistance to reach patient for appointment. Patient also due to Pap Smear with Doren Custard, NP Eugenia Mcalpine

## 2021-03-28 NOTE — Telephone Encounter (Signed)
Patient returned call, accepts appointment for pap and follow up. She says she is feeling better since being diagnosed with covid.   Patient will need transportation to her appointment, unsure if [facility] will be able to provide on 3/16. Patient will attempt to have family member take her to appointment and will call to reschedule if necessary.  Beryle Flock, RN

## 2021-04-02 ENCOUNTER — Other Ambulatory Visit: Payer: Self-pay | Admitting: Infectious Diseases

## 2021-04-02 DIAGNOSIS — B20 Human immunodeficiency virus [HIV] disease: Secondary | ICD-10-CM

## 2021-04-18 ENCOUNTER — Telehealth: Payer: Self-pay

## 2021-04-18 ENCOUNTER — Ambulatory Visit: Payer: Self-pay | Admitting: Infectious Diseases

## 2021-04-18 ENCOUNTER — Ambulatory Visit: Payer: Self-pay | Admitting: Infectious Disease

## 2021-04-18 NOTE — Telephone Encounter (Signed)
Called patient to reschedule missed appointment this morning. No answer and phone clicked off without going to a voicemailbox. Unable to leave a message. ? ?Wyvonne Lenz, RN  ?

## 2021-04-29 ENCOUNTER — Other Ambulatory Visit: Payer: Self-pay

## 2021-04-29 DIAGNOSIS — B20 Human immunodeficiency virus [HIV] disease: Secondary | ICD-10-CM

## 2021-04-30 ENCOUNTER — Ambulatory Visit: Payer: Self-pay | Admitting: Infectious Disease

## 2021-05-06 ENCOUNTER — Ambulatory Visit: Payer: Self-pay | Admitting: Infectious Disease

## 2021-05-20 ENCOUNTER — Telehealth: Payer: Self-pay

## 2021-05-20 ENCOUNTER — Other Ambulatory Visit: Payer: Self-pay | Admitting: Infectious Disease

## 2021-05-20 DIAGNOSIS — B20 Human immunodeficiency virus [HIV] disease: Secondary | ICD-10-CM

## 2021-05-20 MED ORDER — ODEFSEY 200-25-25 MG PO TABS: 1.0000 | ORAL_TABLET | Freq: Every day | ORAL | 11 refills | Status: DC

## 2021-05-20 NOTE — Telephone Encounter (Signed)
Notified patient that refills were sent, advised that she needs to take [facility] with food and avoid any antacids. Patient verbalized understanding and has no further questions.  ? ?Sandie Ano, RN ? ?

## 2021-05-20 NOTE — Telephone Encounter (Signed)
Pt called stating that she is going into a treatment facility next week and will need a refill on her medication odefsey sent to [facility] in [place]. ?

## 2021-05-29 ENCOUNTER — Ambulatory Visit: Payer: Self-pay | Admitting: Infectious Disease

## 2021-08-23 ENCOUNTER — Encounter: Payer: Self-pay | Admitting: Infectious Disease

## 2021-11-12 ENCOUNTER — Ambulatory Visit: Payer: Self-pay | Admitting: Infectious Disease

## 2021-12-09 ENCOUNTER — Encounter: Payer: Self-pay | Admitting: Physician Assistant

## 2021-12-09 ENCOUNTER — Other Ambulatory Visit: Payer: Self-pay

## 2021-12-09 ENCOUNTER — Ambulatory Visit (INDEPENDENT_AMBULATORY_CARE_PROVIDER_SITE_OTHER): Payer: Self-pay | Admitting: Physician Assistant

## 2021-12-09 ENCOUNTER — Ambulatory Visit: Payer: Self-pay | Admitting: Pharmacist

## 2021-12-09 VITALS — BP 180/111 | HR 84 | Temp 98.1°F | Resp 18 | Wt 146.4 lb

## 2021-12-09 DIAGNOSIS — B20 Human immunodeficiency virus [HIV] disease: Secondary | ICD-10-CM

## 2021-12-09 DIAGNOSIS — Z23 Encounter for immunization: Secondary | ICD-10-CM

## 2021-12-09 DIAGNOSIS — I10 Essential (primary) hypertension: Secondary | ICD-10-CM

## 2021-12-09 DIAGNOSIS — Z79899 Other long term (current) drug therapy: Secondary | ICD-10-CM

## 2021-12-09 MED ORDER — AMLODIPINE BESYLATE 10 MG PO TABS: 10.0000 mg | ORAL_TABLET | Freq: Every day | ORAL | 1 refills | Status: DC

## 2021-12-09 MED ORDER — ODEFSEY 200-25-25 MG PO TABS: 1.0000 | ORAL_TABLET | Freq: Every day | ORAL | 11 refills | Status: DC

## 2021-12-09 NOTE — Progress Notes (Signed)
Subjective:    Patient ID: Julia Molina, female    DOB: 12-16-1965, 56 y.o.   MRN: [id number]  Chief Complaint  Patient presents with   Follow-up    HIV management     HPI:  Julia Molina is a 56 y.o. female presents today for follow up regarding HIV-1.  Last visit 05/17/20 VL undetectable CD4 818. She has been adherent to [facility], taking with meals and has had not missed doses.  She tolerates well. She does not have a PCP, has renewed RW. HTN: uncontrolled currently, does not monitor at home.  She ran out of amlodipine 10 mg 3 days ago.  She denies family hx of cardiac events.  She has no personal history of cardiac events.  Current BP 159/109 on repeat 180/111. Underwent right upper molar extraction right before visit.  She has been unable to eat much due to 2 months of tooth discomfort. Lost 10 lbs per patient.   She is currently working a [employer] 7 ato 4 pm. She is drinking beer 40 oz per day. Smoking cigar 1 per day x 1 year. Walking daily >20 minutes per day. Not sexually active since last visit, has no concern for STIs. She cooks most of her meals.  She has plenty of family and friend support locally.  Originally from [place], [place]. Lives at home with her mother. NO pets.    No Known Allergies    Outpatient Medications Prior to Visit  Medication Sig Dispense Refill   aspirin-sod bicarb-citric acid (ALKA-SELTZER) 325 MG TBEF tablet Take 325 mg by mouth once. (Patient not taking: Reported on 05/17/2020)     Multiple Vitamin (MULTIVITAMIN ADULT PO) Take by mouth.     polyethylene glycol (MIRALAX / GLYCOLAX) 17 g packet Take 17 g by mouth daily. 30 each 0   thiamine 100 MG tablet Take 1 tablet (100 mg total) by mouth daily. 30 tablet 0   amLODipine (NORVASC) 10 MG tablet Take 1 tablet (10 mg total) by mouth daily. 30 tablet 11   emtricitabine-rilpivir-tenofovir AF (ODEFSEY) 200-25-25 MG TABS tablet Take 1 tablet by mouth daily with breakfast. 30 tablet 11   No  facility-administered medications prior to visit.     Past Medical History:  Diagnosis Date   Benign essential HTN 10/03/2015   Bilateral hip pain 06/15/2017   Hepatitis 05/10/2020   High cholesterol    HIV (human immunodeficiency virus infection) (HCC)    Joint pain 06/15/2017   Myalgia 06/15/2017   Osteoarthritis 08/05/2016     Past Surgical History:  Procedure Laterality Date   CESAREAN SECTION         Review of Systems  Constitutional:  Positive for appetite change (due to upper right molar pain, extracted today). Negative for activity change, fatigue, fever and unexpected weight change.  HENT:  Positive for dental problem and facial swelling (right side upper mandible due to molar extraction). Negative for sinus pressure and sinus pain.   Eyes:  Negative for photophobia and visual disturbance.  Respiratory:  Negative for cough, chest tightness, shortness of breath and wheezing.   Cardiovascular:  Negative for chest pain, palpitations and leg swelling.  Gastrointestinal:  Negative for abdominal pain, constipation, nausea and vomiting.  Genitourinary: Negative.   Musculoskeletal:  Negative for arthralgias, gait problem, myalgias and neck stiffness.  Skin: Negative.   Allergic/Immunologic: Negative for immunocompromised state.  Neurological:  Negative for dizziness, speech difficulty, light-headedness and headaches.  Hematological:  Negative for adenopathy.  Psychiatric/Behavioral:  Negative.        Objective:    BP (!) 180/111   Pulse 84   Temp 98.1 F (36.7 C)   Resp 18   Wt 146 lb 6.4 oz (66.4 kg)   LMP 06/14/2012   SpO2 100%   BMI 23.63 kg/m  Nursing note and vital signs reviewed.  Physical Exam Vitals reviewed.  Constitutional:      General: She is not in acute distress.    Appearance: Normal appearance. She is normal weight. She is not ill-appearing, toxic-appearing or diaphoretic.  HENT:     Head: Normocephalic.     Nose: Nose normal. No congestion or  rhinorrhea.     Mouth/Throat:     Mouth: Mucous membranes are moist.     Dentition: Abnormal dentition. Dental tenderness, gingival swelling and dental caries present.      Comments: Extracted right upper molar, surrounding mucosal edema, tenderness noted. No drainage, some blood at site Eyes:     Extraocular Movements: Extraocular movements intact.     Conjunctiva/sclera: Conjunctivae normal.     Pupils: Pupils are equal, round, and reactive to light.  Neck:     Vascular: No carotid bruit.  Cardiovascular:     Rate and Rhythm: Normal rate.     Pulses: Normal pulses.     Heart sounds: Normal heart sounds. No murmur heard.    No gallop.  Pulmonary:     Effort: Pulmonary effort is normal.     Breath sounds: Normal breath sounds.  Musculoskeletal:        General: Normal range of motion.     Cervical back: Normal range of motion and neck supple.     Right lower leg: No edema.     Left lower leg: No edema.  Lymphadenopathy:     Cervical: No cervical adenopathy.  Skin:    General: Skin is warm and dry.  Neurological:     General: No focal deficit present.     Mental Status: She is alert and oriented to person, place, and time.  Psychiatric:        Mood and Affect: Mood normal.        Behavior: Behavior normal.        Thought Content: Thought content normal.        Judgment: Judgment normal.         05/17/2020   10:27 AM 02/04/2018   10:02 AM 06/15/2017   10:35 AM 11/21/2014    2:57 PM 02/28/2014    3:01 PM  Depression screen PHQ 2/9  Decreased Interest 0 0 0 0 0  Down, Depressed, Hopeless 0 0 0 0 0  PHQ - 2 Score 0 0 0 0 0       Assessment & Plan:  HTN: Follow up in 9month for BP recheck on amlodipine 10 mg and re-assess if effective management while looking for a PCP; discussed modifiable risk factors, smoking cessation, alcohol limits, DASH diet, exercise.  HIV-follow up with Dr. Tommy Medal in 4-6 months -adherent and tolerating current ART regimen with Odefsey Pap smear  last completed 2015 schedule with Julia Madeira, NP in our clinic at checkout Refilled amlodipine 10 mg once daily Refilled Odefsey Flu vaccine today Covid vaccine next visit To improve blood pressure you must quit smoking the small cigars, limit alchol intake to no more than 12 oz per day, exercise vigorous walking (as you already do) at least 20 minutes daily, avoid salty foods.  Patient Active Problem List  Diagnosis Date Noted   Screening for cervical cancer 10/24/2020   Rash of both hands 10/24/2020   Routine screening for STI (sexually transmitted infection) 10/24/2020   Abdominal pain, epigastric    RUQ abdominal pain 05/02/2020   Hypokalemia 05/02/2020   Hyponatremia 05/02/2020   Nausea & vomiting 05/02/2020   Transaminitis 05/02/2020   Myalgia 06/15/2017   Joint pain 06/15/2017   Bilateral hip pain 06/15/2017   Osteoarthritis 08/05/2016   Benign essential HTN 10/03/2015   Major depression, chronic 02/28/2014   Unspecified vitamin D deficiency 03/25/2012   Boils 03/24/2012   Hyperlipidemia 01/07/2012   Smoker 07/02/2011   Amenorrhea 03/05/2011   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/05/2011   ELEVATED BLOOD PRESSURE 10/06/2008   ANEMIA-IRON DEFICIENCY 11/27/2006   HIV disease (HCC) 11/02/2006     Problem List Items Addressed This Visit       Other   HIV disease (HCC)   Relevant Medications   emtricitabine-rilpivir-tenofovir AF (ODEFSEY) 200-25-25 MG TABS tablet   Other Relevant Orders   COMPLETE METABOLIC PANEL WITH GFR   CBC with Differential/Platelet   Lipid panel   Hemoglobin A1c   HIV-1 RNA quant-no reflex-bld   T-helper cells (CD4) count (not at [facility])   Other Visit Diagnoses     Primary hypertension    -  Primary   Relevant Medications   amLODipine (NORVASC) 10 MG tablet   Other Relevant Orders   COMPLETE METABOLIC PANEL WITH GFR   CBC with Differential/Platelet   Lipid panel   Hemoglobin A1c   HIV-1 RNA quant-no reflex-bld   T-helper cells  (CD4) count (not at [facility])   Pharmacologic therapy       Relevant Orders   Lipid panel   Hemoglobin A1c   Need for immunization against influenza       Relevant Orders   Flu Vaccine QUAD 24mo+IM (Fluarix, Fluzone & Alfiuria Quad PF) (Completed)        I am having Kandance R. Fiumara maintain her aspirin-sod bicarb-citric acid, thiamine, polyethylene glycol, Multiple Vitamin (MULTIVITAMIN ADULT PO), amLODipine, and Odefsey.   Meds ordered this encounter  Medications   amLODipine (NORVASC) 10 MG tablet    Sig: Take 1 tablet (10 mg total) by mouth daily.    Dispense:  30 tablet    Refill:  1    Order Specific Question:   Supervising Provider    Answer:   VAN DAM, CORNELIUS N [[id number]]   emtricitabine-rilpivir-tenofovir AF (ODEFSEY) 200-25-25 MG TABS tablet    Sig: Take 1 tablet by mouth daily with breakfast.    Dispense:  30 tablet    Refill:  11    Additional refills at 3/16 appt    Order Specific Question:   Supervising Provider    Answer:   Zenaida Niece DAM, CORNELIUS N [[id number]]     Follow-up: Return in about 1 month (around 01/08/2022) for BP recheck with Tresa Endo.

## 2021-12-09 NOTE — Patient Instructions (Addendum)
Follow up in 78month for BP recheck on amlodipine 10 mg and re-assess if effective management while looking for a PCP HIV-follow up with Dr. Tommy Medal in 4-6 months  Pap smear last completed 2015 schedule with Janene Madeira, NP in our clinic at checkout Refilled amlodipine 10 mg once daily Refilled Odefsey Flu vaccine today Covid vaccine next visit To improve blood pressure you must quit smoking the small cigars, limit alchol intake to no more than 12 oz per day, exercise vigorous walking (as you already do) at least 20 minutes daily, avoid salty foods.

## 2021-12-10 LAB — T-HELPER CELLS (CD4) COUNT (NOT AT ARMC)
CD4 % Helper T Cell: 49 % (ref 33–65)
CD4 T Cell Abs: 670 /uL (ref 400–1790)

## 2021-12-11 LAB — CBC WITH DIFFERENTIAL/PLATELET
Absolute Monocytes: 414 cells/uL (ref 200–950)
Basophils Absolute: 32 cells/uL (ref 0–200)
Basophils Relative: 0.7 %
Eosinophils Absolute: 143 cells/uL (ref 15–500)
Eosinophils Relative: 3.1 %
HCT: 41.9 % (ref 35.0–45.0)
Hemoglobin: 14.5 g/dL (ref 11.7–15.5)
Lymphs Abs: 1546 cells/uL (ref 850–3900)
MCH: 30.9 pg (ref 27.0–33.0)
MCHC: 34.6 g/dL (ref 32.0–36.0)
MCV: 89.1 fL (ref 80.0–100.0)
MPV: 9.3 fL (ref 7.5–12.5)
Monocytes Relative: 9 %
Neutro Abs: 2466 cells/uL (ref 1500–7800)
Neutrophils Relative %: 53.6 %
Platelets: 285 10*3/uL (ref 140–400)
RBC: 4.7 10*6/uL (ref 3.80–5.10)
RDW: 13.4 % (ref 11.0–15.0)
Total Lymphocyte: 33.6 %
WBC: 4.6 10*3/uL (ref 3.8–10.8)

## 2021-12-11 LAB — HEMOGLOBIN A1C
Hgb A1c MFr Bld: 6.4 % of total Hgb — ABNORMAL HIGH (ref <5.7)
Mean Plasma Glucose: 137 mg/dL
eAG (mmol/L): 7.6 mmol/L

## 2021-12-11 LAB — COMPLETE METABOLIC PANEL WITH GFR
AG Ratio: 1.4 (calc) (ref 1.0–2.5)
ALT: 57 U/L — ABNORMAL HIGH (ref 6–29)
AST: 60 U/L — ABNORMAL HIGH (ref 10–35)
Albumin: 4.7 g/dL (ref 3.6–5.1)
Alkaline phosphatase (APISO): 73 U/L (ref 37–153)
BUN: 10 mg/dL (ref 7–25)
CO2: 23 mmol/L (ref 20–32)
Calcium: 9.6 mg/dL (ref 8.6–10.4)
Chloride: 104 mmol/L (ref 98–110)
Creat: 0.61 mg/dL (ref 0.50–1.03)
Globulin: 3.3 g/dL (calc) (ref 1.9–3.7)
Glucose, Bld: 97 mg/dL (ref 65–99)
Potassium: 4.6 mmol/L (ref 3.5–5.3)
Sodium: 137 mmol/L (ref 135–146)
Total Bilirubin: 0.3 mg/dL (ref 0.2–1.2)
Total Protein: 8 g/dL (ref 6.1–8.1)
eGFR: 105 mL/min/{1.73_m2} (ref >=60)

## 2021-12-11 LAB — LIPID PANEL
Cholesterol: 284 mg/dL — ABNORMAL HIGH (ref <200)
HDL: 95 mg/dL (ref >=50)
LDL Cholesterol (Calc): 159 mg/dL (calc) — ABNORMAL HIGH
Non-HDL Cholesterol (Calc): 189 mg/dL (calc) — ABNORMAL HIGH (ref <130)
Total CHOL/HDL Ratio: 3 (calc) (ref <5.0)
Triglycerides: 160 mg/dL — ABNORMAL HIGH (ref <150)

## 2021-12-11 LAB — HIV-1 RNA QUANT-NO REFLEX-BLD
HIV 1 RNA Quant: NOT DETECTED Copies/mL
HIV-1 RNA Quant, Log: NOT DETECTED Log cps/mL

## 2021-12-12 ENCOUNTER — Telehealth: Payer: Self-pay

## 2021-12-12 NOTE — Telephone Encounter (Signed)
Attempted to call patient regarding lab results. Not able to reach her with either number listed. Will send mychart message requesting cal back to review results. Voicemail not set up. [name], RMA

## 2021-12-12 NOTE — Telephone Encounter (Signed)
-----   Message from Horton Finer, [place] sent at 12/12/2021  9:19 AM EST -----  Please notify Julia Molina her hemoglobin A1c is in the prediabetic range at 6.4 and her cholesterol is elevated.  We need to add statin therapy to help reduce cholesterol. It is recommended to reduce greasy, fatty and heavy carbohydrates foods.  Eat mostly leafy green vegetables and lean meat.  Your liver enzymes are elevated as well, which more than likely is related to alcohol intake.  You need to stop drinking alcohol and we can recheck this at your next visit with me.   CD4 shows a strong immune system at 670 and VL is undetectable. All other labs are within normal limits.

## 2021-12-13 NOTE — Telephone Encounter (Signed)
Second attempt made to reach patient regarding results. Not able to reach her at this time. Voicemail is not setup  [name], RMA

## 2021-12-13 NOTE — Telephone Encounter (Signed)
Patient returned phone call and I relayed all lab results to the patient Patient would like the statin sent to [facility] listed on her chart.

## 2021-12-16 ENCOUNTER — Other Ambulatory Visit: Payer: Self-pay | Admitting: Physician Assistant

## 2021-12-16 MED ORDER — PITAVASTATIN CALCIUM 4 MG PO TABS: 4.0000 mg | ORAL_TABLET | Freq: Every day | ORAL | 2 refills | Status: DC

## 2022-01-06 ENCOUNTER — Telehealth: Payer: Self-pay

## 2022-01-06 NOTE — Telephone Encounter (Signed)
Patient called to inform Arvilla Meres, PA that she has a bump underneath her right breast that she would like looked at appointment on 12/6. Patient thinks it is a boil, it has been there for nearly one month and hasn't busted.    Kingsly Kloepfer Lesli Albee, CMA

## 2022-01-08 ENCOUNTER — Other Ambulatory Visit: Payer: Self-pay

## 2022-01-08 ENCOUNTER — Ambulatory Visit (INDEPENDENT_AMBULATORY_CARE_PROVIDER_SITE_OTHER): Payer: Self-pay | Admitting: Physician Assistant

## 2022-01-08 ENCOUNTER — Encounter: Payer: Self-pay | Admitting: Physician Assistant

## 2022-01-08 VITALS — BP 132/89 | HR 78 | Temp 98.0°F | Resp 16 | Wt 141.6 lb

## 2022-01-08 DIAGNOSIS — I10 Essential (primary) hypertension: Secondary | ICD-10-CM

## 2022-01-08 DIAGNOSIS — N61 Mastitis without abscess: Secondary | ICD-10-CM

## 2022-01-08 DIAGNOSIS — R748 Abnormal levels of other serum enzymes: Secondary | ICD-10-CM

## 2022-01-08 MED ORDER — AMLODIPINE BESYLATE 10 MG PO TABS: 10.0000 mg | ORAL_TABLET | Freq: Every day | ORAL | 3 refills | Status: DC

## 2022-01-08 MED ORDER — DOXYCYCLINE HYCLATE 100 MG PO TABS: 100.0000 mg | ORAL_TABLET | Freq: Two times a day (BID) | ORAL | 0 refills | Status: AC

## 2022-01-08 MED ORDER — MUPIROCIN 2 % EX OINT: 1.0000 | TOPICAL_OINTMENT | Freq: Two times a day (BID) | CUTANEOUS | 0 refills | Status: DC

## 2022-01-08 NOTE — Progress Notes (Signed)
Subjective:    Patient ID: Julia Molina, female    DOB: [birth year], 56 y.o.   MRN: [id number]  Chief Complaint  Patient presents with   Follow-up    HTN and abscess on left breast     HPI:  Julia Molina is a 56 y.o. female who presents today for follow up regarding HTN and "draining boil on left breast."  She reports taking both amlodipine and pitavastatin provided last visit 12/08/21 daily and tolerating well. She is drinking a quart of beer daily. She is working on reducing consumption. She denies any cardiac symptoms, BV, neck pain, HA, CP, dizziness, SOB, back pain, edema.  In addition she reports red, hot, draining lesion beneath her left breast.  While applying warm compress lesion opened and began draining purulent fluid. She has used tylenol for pain, though "very tender to touch" 8/10 pain. She has been unable to move or work due to pain. She denies allergies to any antibiotics.   No Known Allergies    Outpatient Medications Prior to Visit  Medication Sig Dispense Refill   Pitavastatin Calcium 4 MG TABS Take 1 tablet (4 mg total) by mouth daily. 90 tablet 2   aspirin-sod bicarb-citric acid (ALKA-SELTZER) 325 MG TBEF tablet Take 325 mg by mouth once. (Patient not taking: Reported on 05/17/2020)     emtricitabine-rilpivir-tenofovir AF (ODEFSEY) 200-25-25 MG TABS tablet Take 1 tablet by mouth daily with breakfast. 30 tablet 11   Multiple Vitamin (MULTIVITAMIN ADULT PO) Take by mouth.     polyethylene glycol (MIRALAX / GLYCOLAX) 17 g packet Take 17 g by mouth daily. 30 each 0   thiamine 100 MG tablet Take 1 tablet (100 mg total) by mouth daily. 30 tablet 0   amLODipine (NORVASC) 10 MG tablet Take 1 tablet (10 mg total) by mouth daily. 30 tablet 1   No facility-administered medications prior to visit.     Past Medical History:  Diagnosis Date   Benign essential HTN 10/03/2015   Bilateral hip pain 06/15/2017   Hepatitis 05/10/2020   High cholesterol    HIV (human  immunodeficiency virus infection) (HCC)    Joint pain 06/15/2017   Myalgia 06/15/2017   Osteoarthritis 08/05/2016     Past Surgical History:  Procedure Laterality Date   CESAREAN SECTION         Review of Systems  Constitutional:  Negative for appetite change and fever.  HENT: Negative.    Respiratory:  Negative for chest tightness and shortness of breath.   Cardiovascular:  Negative for chest pain, palpitations and leg swelling.  Gastrointestinal:  Negative for abdominal pain, diarrhea and nausea.  Musculoskeletal:  Negative for neck pain.  Skin:  Positive for wound (left breast).  Neurological:  Negative for dizziness, weakness and headaches.  Hematological:  Negative for adenopathy.      Objective:    BP 132/89   Pulse 78   Temp 98 F (36.7 C)   Resp 16   Wt 141 lb 9.6 oz (64.2 kg)   LMP 06/14/2012   BMI 22.85 kg/m  Nursing note and vital signs reviewed.  Physical Exam Vitals (repeated BP and was w/i normal range 132/89, considerable pain due to abscess) reviewed.  Constitutional:      General: She is not in acute distress.    Appearance: Normal appearance. She is normal weight. She is not ill-appearing.  HENT:     Head: Normocephalic.  Cardiovascular:     Rate and Rhythm: Normal rate and  regular rhythm.     Pulses: Normal pulses.     Heart sounds: Normal heart sounds. No murmur heard.    No friction rub. No gallop.  Pulmonary:     Effort: Pulmonary effort is normal.     Breath sounds: Normal breath sounds.  Skin:    Findings: Lesion (located at along posterior aspect of left breast, lesion approximatley 3 cm diameter, ttp, surrounding erythema, serous fluid and purulent discharge noted, indurated, non fluctuant. Applied triple antiobitic ointment to nonadherent gauze as replacement ban) present.  Neurological:     General: No focal deficit present.     Mental Status: She is alert and oriented to person, place, and time.  Psychiatric:        Mood and  Affect: Mood normal.        Behavior: Behavior normal.        Thought Content: Thought content normal.         05/17/2020   10:27 AM 02/04/2018   10:02 AM 06/15/2017   10:35 AM 11/21/2014    2:57 PM 02/28/2014    3:01 PM  Depression screen PHQ 2/9  Decreased Interest 0 0 0 0 0  Down, Depressed, Hopeless 0 0 0 0 0  PHQ - 2 Score 0 0 0 0 0       Assessment & Plan:  HTN: well controlled to amlodipine 10 mg, tolerating well and adherent. Repeated BP 132/89. Must follow up with PCP for ongoing management Elevated liver enzymes: Discussed alcohol use and need to reduce amount daily, has hx of abuse, willing and actively reducing intake Cellulitis left breast: actively draining, dressed with triple abx and non adherent dressing. Start doxy 100 mg bid x 10 days, mupirocin ointment for future use. Warm compresses, keep hands away and clean when applying new dressing Follow up with Dr. Daiva Eves for ongoing mngt of HIV-1   Patient Active Problem List   Diagnosis Date Noted   Screening for cervical cancer 10/24/2020   Rash of both hands 10/24/2020   Routine screening for STI (sexually transmitted infection) 10/24/2020   Abdominal pain, epigastric    RUQ abdominal pain 05/02/2020   Hypokalemia 05/02/2020   Hyponatremia 05/02/2020   Nausea & vomiting 05/02/2020   Transaminitis 05/02/2020   Myalgia 06/15/2017   Joint pain 06/15/2017   Bilateral hip pain 06/15/2017   Osteoarthritis 08/05/2016   Benign essential HTN 10/03/2015   Major depression, chronic 02/28/2014   Unspecified vitamin D deficiency 03/25/2012   Boils 03/24/2012   Hyperlipidemia 01/07/2012   Smoker 07/02/2011   Amenorrhea 03/05/2011   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/05/2011   ELEVATED BLOOD PRESSURE 10/06/2008   ANEMIA-IRON DEFICIENCY 11/27/2006   HIV disease (HCC) 11/02/2006     Problem List Items Addressed This Visit   None Visit Diagnoses     Cellulitis of left breast    -  Primary   Relevant Medications    doxycycline (VIBRA-TABS) 100 MG tablet   mupirocin ointment (BACTROBAN) 2 %   Other Relevant Orders   Anaerobic and Aerobic Culture   Primary hypertension       Relevant Medications   amLODipine (NORVASC) 10 MG tablet   Elevated liver enzymes            I am having Ron R. Hy start on doxycycline and mupirocin ointment. I am also having her maintain her aspirin-sod bicarb-citric acid, thiamine, polyethylene glycol, Multiple Vitamin (MULTIVITAMIN ADULT PO), Odefsey, Pitavastatin Calcium, and amLODipine.   Meds ordered  this encounter  Medications   doxycycline (VIBRA-TABS) 100 MG tablet    Sig: Take 1 tablet (100 mg total) by mouth 2 (two) times daily for 10 days.    Dispense:  20 tablet    Refill:  0    Order Specific Question:   Supervising Provider    Answer:   VAN DAM, CORNELIUS N [[id number]]   mupirocin ointment (BACTROBAN) 2 %    Sig: Apply 1 Application topically 2 (two) times daily.    Dispense:  22 g    Refill:  0    Order Specific Question:   Supervising Provider    Answer:   VAN DAM, CORNELIUS N [[id number]]   amLODipine (NORVASC) 10 MG tablet    Sig: Take 1 tablet (10 mg total) by mouth daily.    Dispense:  30 tablet    Refill:  3    Order Specific Question:   Supervising Provider    Answer:   VAN DAM, CORNELIUS N [[id number]]     Follow-up: Return if symptoms worsen or fail to improve.

## 2022-01-08 NOTE — Patient Instructions (Addendum)
Start doxycycline 100 mg twice daily x 10 days for abscess Mupirocin ointment for future wounds Culture sent to find out specific bacteria Keep clean  Warm compresses 3 times daily Blood pressure : follow up with primary amlodipine 10 mg daily, increased today most likely due to pain Must reduce alcohol intake  Continue pitavastatin Follow up with provider for ongoing care

## 2022-01-09 MED ORDER — MUPIROCIN 2 % EX OINT: 1.0000 | TOPICAL_OINTMENT | Freq: Two times a day (BID) | CUTANEOUS | 0 refills | Status: AC

## 2022-01-09 NOTE — Addendum Note (Signed)
Addended by: Horton Finer on: 01/09/2022 02:33 PM   Modules accepted: Orders

## 2022-01-14 LAB — ANAEROBIC AND AEROBIC CULTURE
MICRO NUMBER:: 14282806
MICRO NUMBER:: 14282807
SPECIMEN QUALITY:: ADEQUATE
SPECIMEN QUALITY:: ADEQUATE

## 2022-07-10 ENCOUNTER — Telehealth: Payer: Self-pay

## 2022-07-10 DIAGNOSIS — I10 Essential (primary) hypertension: Secondary | ICD-10-CM

## 2022-07-10 MED ORDER — AMLODIPINE BESYLATE 10 MG PO TABS: 10.0000 mg | ORAL_TABLET | Freq: Every day | ORAL | 0 refills | Status: DC

## 2022-07-10 NOTE — Telephone Encounter (Signed)
Patient called requesting refill of amlodipine. She has not established with a PCP yet. Advised that one refill could be sent, but that further management would need to come from primary care. Gave her phone number for [facility].   Sandie Ano, RN

## 2022-09-22 ENCOUNTER — Telehealth: Payer: Self-pay

## 2022-09-22 ENCOUNTER — Other Ambulatory Visit: Payer: Self-pay

## 2022-09-22 DIAGNOSIS — I10 Essential (primary) hypertension: Secondary | ICD-10-CM

## 2022-09-22 MED ORDER — AMLODIPINE BESYLATE 10 MG PO TABS: 10.0000 mg | ORAL_TABLET | Freq: Every day | ORAL | 1 refills | Status: DC

## 2022-09-22 NOTE — Telephone Encounter (Signed)
Rx sent 

## 2022-09-22 NOTE — Telephone Encounter (Signed)
Ok to refill Amlodipine until she gets in with a pcp. Patient recently got health insurance and is now able to establish with a pcp. Julia Molina T Pricilla Loveless

## 2022-10-28 ENCOUNTER — Ambulatory Visit: Payer: Self-pay | Admitting: Infectious Disease

## 2022-11-27 ENCOUNTER — Other Ambulatory Visit: Payer: Self-pay | Admitting: Infectious Disease

## 2022-11-27 ENCOUNTER — Telehealth: Payer: Self-pay

## 2022-11-27 DIAGNOSIS — I10 Essential (primary) hypertension: Secondary | ICD-10-CM

## 2022-11-27 DIAGNOSIS — E7849 Other hyperlipidemia: Secondary | ICD-10-CM

## 2022-11-27 MED ORDER — AMLODIPINE BESYLATE 10 MG PO TABS: 10.0000 mg | ORAL_TABLET | Freq: Every day | ORAL | 0 refills | Status: DC

## 2022-11-27 MED ORDER — PITAVASTATIN CALCIUM 4 MG PO TABS: 4.0000 mg | ORAL_TABLET | Freq: Every day | ORAL | 0 refills | Status: AC

## 2022-11-27 NOTE — Telephone Encounter (Signed)
Patient called requesting refills of amlodipine and pitavastatin. She has not yet established with a PCP.   Sandie Ano, RN

## 2022-11-27 NOTE — Addendum Note (Signed)
Addended by: Linna Hoff D on: 11/27/2022 02:32 PM   Modules accepted: Orders

## 2022-12-16 ENCOUNTER — Ambulatory Visit: Payer: Self-pay | Admitting: Infectious Disease

## 2022-12-25 ENCOUNTER — Ambulatory Visit: Payer: Self-pay | Admitting: Infectious Disease

## 2023-01-06 ENCOUNTER — Other Ambulatory Visit: Payer: Self-pay | Admitting: Infectious Disease

## 2023-01-06 DIAGNOSIS — I10 Essential (primary) hypertension: Secondary | ICD-10-CM

## 2023-01-06 DIAGNOSIS — E7849 Other hyperlipidemia: Secondary | ICD-10-CM

## 2023-01-06 NOTE — Telephone Encounter (Signed)
Additional refills will need to be sent to PCP.

## 2023-01-07 ENCOUNTER — Other Ambulatory Visit: Payer: Self-pay

## 2023-01-07 ENCOUNTER — Ambulatory Visit: Payer: Self-pay | Admitting: Infectious Disease

## 2023-01-07 DIAGNOSIS — B20 Human immunodeficiency virus [HIV] disease: Secondary | ICD-10-CM

## 2023-01-07 MED ORDER — ODEFSEY 200-25-25 MG PO TABS: 1.0000 | ORAL_TABLET | Freq: Every day | ORAL | 1 refills | Status: DC

## 2023-02-16 ENCOUNTER — Encounter: Payer: Self-pay | Admitting: Infectious Disease

## 2023-02-16 DIAGNOSIS — K7010 Alcoholic hepatitis without ascites: Secondary | ICD-10-CM

## 2023-02-16 HISTORY — DX: Alcoholic hepatitis without ascites: K70.10

## 2023-02-17 ENCOUNTER — Ambulatory Visit: Payer: 59 | Admitting: Infectious Disease

## 2023-02-17 DIAGNOSIS — E785 Hyperlipidemia, unspecified: Secondary | ICD-10-CM

## 2023-02-17 DIAGNOSIS — B20 Human immunodeficiency virus [HIV] disease: Secondary | ICD-10-CM

## 2023-02-17 DIAGNOSIS — F17200 Nicotine dependence, unspecified, uncomplicated: Secondary | ICD-10-CM

## 2023-02-17 DIAGNOSIS — K7010 Alcoholic hepatitis without ascites: Secondary | ICD-10-CM

## 2023-02-17 DIAGNOSIS — Z113 Encounter for screening for infections with a predominantly sexual mode of transmission: Secondary | ICD-10-CM

## 2023-03-01 IMAGING — CR DG CHEST 2V
2 series · 2 of 2 positions shown · non-contrast
Comparison: 05/01/2020

CLINICAL DATA: 2 day history of cough.

EXAM:
CHEST - 2 VIEW

[w chest pa]
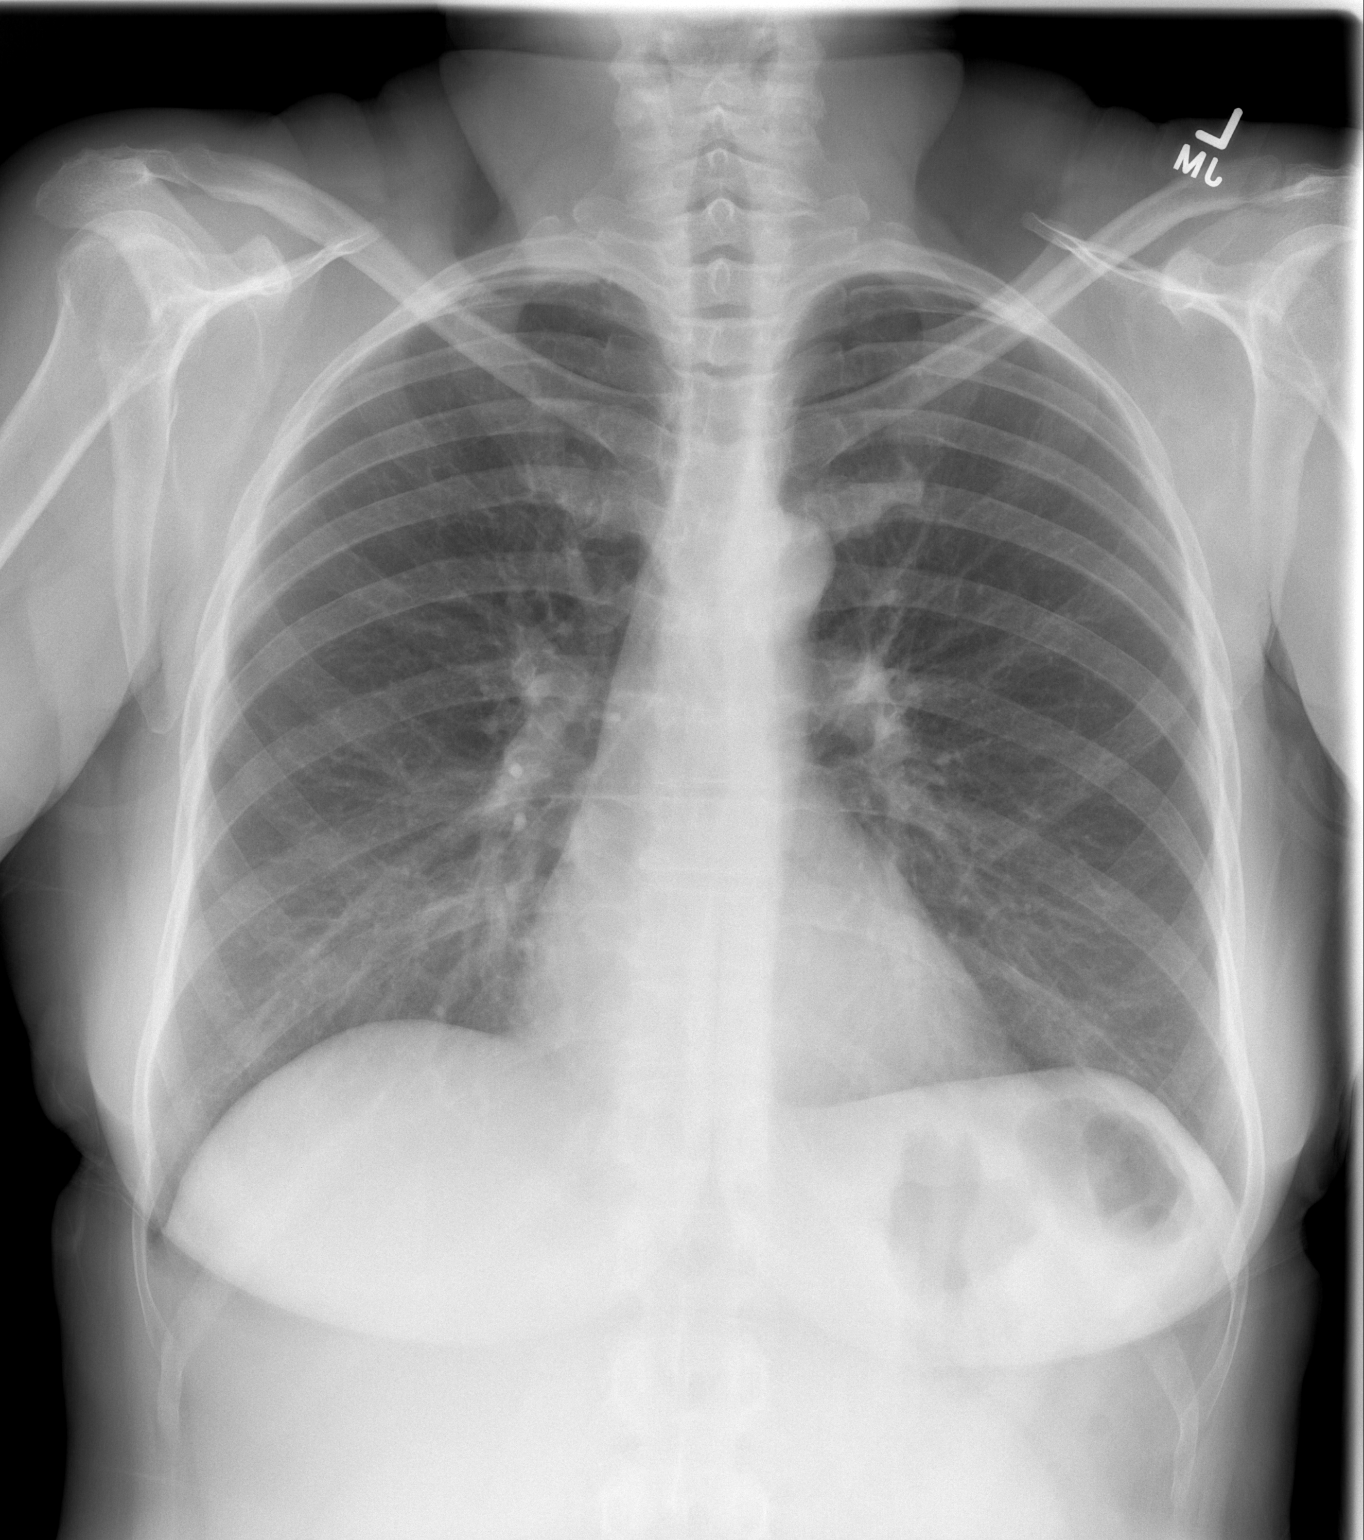

[w chest lat]
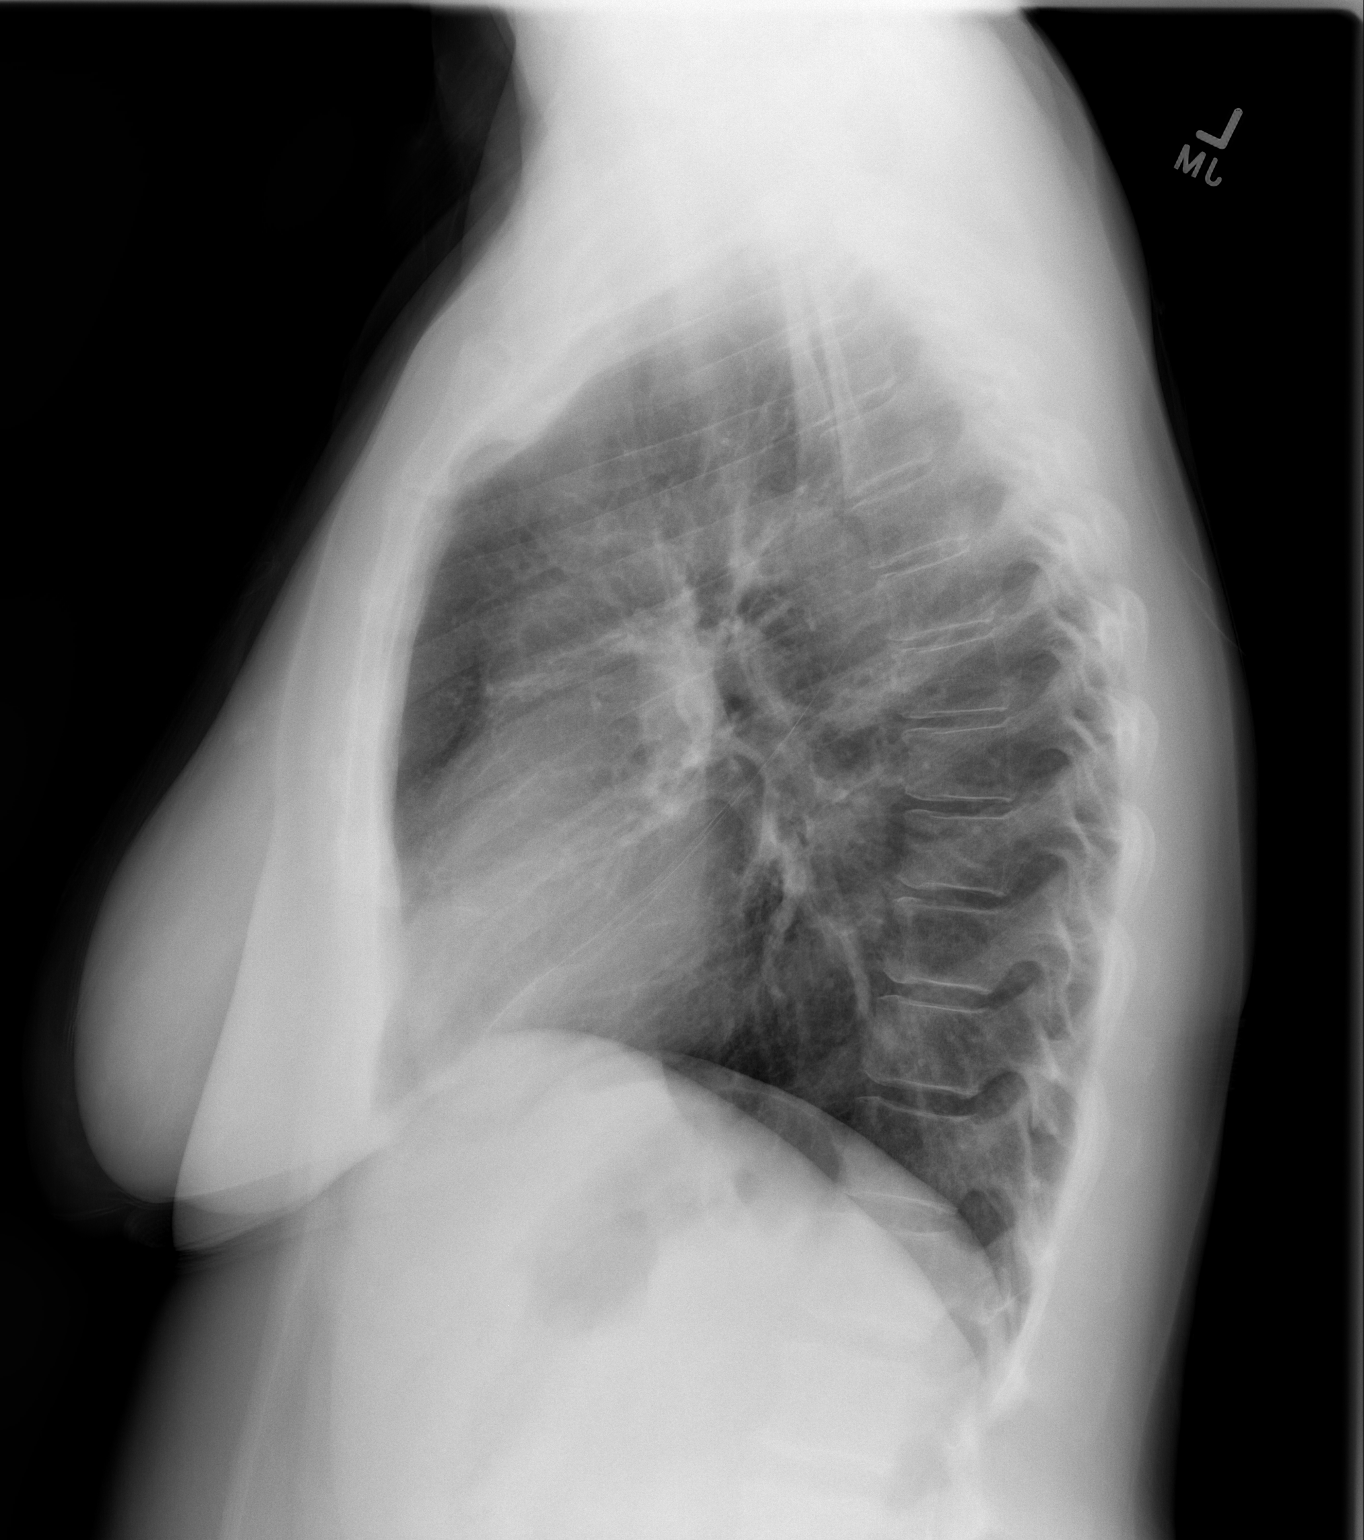

[2 of 2 positions shown; findings below may reference images not displayed]

FINDINGS: The lungs are clear without focal pneumonia, edema, pneumothorax or
pleural effusion. The cardiopericardial silhouette is within normal
limits for size. The visualized bony structures of the thorax show
no acute abnormality.
IMPRESSION: No active cardiopulmonary disease.

## 2023-03-05 ENCOUNTER — Other Ambulatory Visit: Payer: Self-pay

## 2023-03-05 DIAGNOSIS — I10 Essential (primary) hypertension: Secondary | ICD-10-CM

## 2023-03-05 MED ORDER — AMLODIPINE BESYLATE 10 MG PO TABS: 10.0000 mg | ORAL_TABLET | Freq: Every day | ORAL | 0 refills | Status: DC

## 2023-03-06 ENCOUNTER — Other Ambulatory Visit: Payer: Self-pay

## 2023-03-06 DIAGNOSIS — I10 Essential (primary) hypertension: Secondary | ICD-10-CM

## 2023-03-06 MED ORDER — AMLODIPINE BESYLATE 10 MG PO TABS: 10.0000 mg | ORAL_TABLET | Freq: Every day | ORAL | 0 refills | Status: AC

## 2023-03-18 ENCOUNTER — Ambulatory Visit: Payer: 59 | Admitting: Infectious Disease

## 2023-03-29 ENCOUNTER — Encounter: Payer: Self-pay | Admitting: Infectious Disease

## 2023-03-29 DIAGNOSIS — Z7185 Encounter for immunization safety counseling: Secondary | ICD-10-CM | POA: Insufficient documentation

## 2023-03-29 DIAGNOSIS — L0292 Furuncle, unspecified: Secondary | ICD-10-CM | POA: Insufficient documentation

## 2023-03-29 DIAGNOSIS — Z124 Encounter for screening for malignant neoplasm of cervix: Secondary | ICD-10-CM | POA: Insufficient documentation

## 2023-03-29 HISTORY — DX: Encounter for immunization safety counseling: Z71.85

## 2023-03-29 HISTORY — DX: Furuncle, unspecified: L02.92

## 2023-03-29 HISTORY — DX: Encounter for screening for malignant neoplasm of cervix: Z12.4

## 2023-03-30 ENCOUNTER — Ambulatory Visit: Payer: 59 | Admitting: Infectious Disease

## 2023-03-30 DIAGNOSIS — B20 Human immunodeficiency virus [HIV] disease: Secondary | ICD-10-CM

## 2023-03-30 DIAGNOSIS — Z113 Encounter for screening for infections with a predominantly sexual mode of transmission: Secondary | ICD-10-CM

## 2023-03-30 DIAGNOSIS — L0292 Furuncle, unspecified: Secondary | ICD-10-CM

## 2023-03-30 DIAGNOSIS — Z124 Encounter for screening for malignant neoplasm of cervix: Secondary | ICD-10-CM

## 2023-03-30 DIAGNOSIS — Z7185 Encounter for immunization safety counseling: Secondary | ICD-10-CM

## 2023-03-30 DIAGNOSIS — K7010 Alcoholic hepatitis without ascites: Secondary | ICD-10-CM

## 2023-03-30 DIAGNOSIS — F17200 Nicotine dependence, unspecified, uncomplicated: Secondary | ICD-10-CM

## 2023-04-08 ENCOUNTER — Telehealth: Payer: Self-pay

## 2023-04-08 DIAGNOSIS — B20 Human immunodeficiency virus [HIV] disease: Secondary | ICD-10-CM

## 2023-04-08 MED ORDER — ODEFSEY 200-25-25 MG PO TABS
1.0000 | ORAL_TABLET | Freq: Every day | ORAL | 0 refills | Status: DC
Start: 2023-04-08 — End: 2023-04-09

## 2023-04-08 NOTE — Telephone Encounter (Signed)
 Patient called requesting refill of Odefsey. Discussed that she will need to keep 3/17 appointment for additional fills. Patient verbalized understanding and has no further questions.   Sandie Ano, RN

## 2023-04-09 MED ORDER — ODEFSEY 200-25-25 MG PO TABS: 1.0000 | ORAL_TABLET | Freq: Every day | ORAL | 0 refills | Status: DC

## 2023-04-09 NOTE — Telephone Encounter (Signed)
 Pt requested refills for Odefsey be sent to [facility] in [facility].  Refills sent. Canceled prescription sent to [facility].  [name], RMA

## 2023-04-09 NOTE — Addendum Note (Signed)
 Addended by: [name] on: 04/09/2023 01:04 PM   Modules accepted: Orders

## 2023-04-20 ENCOUNTER — Ambulatory Visit: Payer: 59 | Admitting: Infectious Disease

## 2023-05-05 ENCOUNTER — Ambulatory Visit: Admitting: Infectious Disease

## 2023-05-25 ENCOUNTER — Telehealth: Payer: Self-pay

## 2023-05-25 ENCOUNTER — Other Ambulatory Visit: Payer: Self-pay

## 2023-05-25 DIAGNOSIS — B20 Human immunodeficiency virus [HIV] disease: Secondary | ICD-10-CM

## 2023-05-25 MED ORDER — ODEFSEY 200-25-25 MG PO TABS: 1.0000 | ORAL_TABLET | Freq: Every day | ORAL | 0 refills | Status: AC

## 2023-05-25 NOTE — Telephone Encounter (Signed)
 Patient called requesting a refill on Amlodipine  and her Odefsey .  Patient reports only being out of Odefsey  3 days. We have not seen her since 2023. She is scheduled for a follow up in May and has not got her insurance straight.  Do you want to refill the medications? I so not see where she has been filling the medication regularly.  Baily Hovanec Adel Holt, CMA

## 2023-05-25 NOTE — Telephone Encounter (Signed)
 Patient reports taken the medication consistently. She has only missed the last 3 days due to needing refills.  Medication sent to the pharmacy for her

## 2023-06-03 NOTE — Progress Notes (Signed)
 The ASCVD Risk score (Arnett DK, et al., 2019) failed to calculate for the following reasons:   The systolic blood pressure is missing  Arlon Bergamo, BSN, Charity fundraiser

## 2023-06-15 NOTE — Progress Notes (Deleted)
   Subjective:  Chief complaint: follow-up for HIV disease on medications   Patient ID: Julia Molina, female    DOB: 10/10/1965, 58 y.o.   MRN: [id number]  HPI   Past Medical History:  Diagnosis Date   Alcoholic hepatitis 02/16/2023   Benign essential HTN 10/03/2015   Bilateral hip pain 06/15/2017   Boil 03/29/2023   Cervical cancer screening 03/29/2023   Hepatitis 05/10/2020   High cholesterol    HIV (human immunodeficiency virus infection) (HCC)    Joint pain 06/15/2017   Myalgia 06/15/2017   Osteoarthritis 08/05/2016   Vaccine counseling 03/29/2023    Past Surgical History:  Procedure Laterality Date   CESAREAN SECTION      No family history on file.    Social History   Socioeconomic History   Marital status: Single    Spouse name: Not on file   Number of children: Not on file   Years of education: Not on file   Highest education level: Not on file  Occupational History   Not on file  Tobacco Use   Smoking status: Former    Current packs/day: 0.20    Average packs/day: 0.2 packs/day for 33.5 years (6.7 ttl pk-yrs)    Types: Cigars, Cigarettes    Start date: 12/06/1989   Smokeless tobacco: Never   Tobacco comments:    black n' milds occ   Vaping Use   Vaping status: Never Used  Substance and Sexual Activity   Alcohol use: Not Currently   Drug use: Not Currently    Types: Marijuana   Sexual activity: Not on file  Other Topics Concern   Not on file  Social History Narrative   Not on file   Social Drivers of Health   Financial Resource Strain: Not on file  Food Insecurity: Not on file  Transportation Needs: Not on file  Physical Activity: Not on file  Stress: Not on file  Social Connections: Not on file    No Known Allergies   Current Outpatient Medications:    amLODipine  (NORVASC ) 10 MG tablet, Take 1 tablet (10 mg total) by mouth daily., Disp: 30 tablet, Rfl: 0   aspirin-sod bicarb-citric acid (ALKA-SELTZER) 325 MG TBEF tablet, Take 325  mg by mouth once. (Patient not taking: Reported on 05/17/2020), Disp: , Rfl:    emtricitabine -rilpivir-tenofovir  AF (ODEFSEY ) 200-25-25 MG TABS tablet, Take 1 tablet by mouth daily with breakfast., Disp: 30 tablet, Rfl: 0   Multiple Vitamin (MULTIVITAMIN ADULT PO), Take by mouth., Disp: , Rfl:    mupirocin  ointment (BACTROBAN ) 2 %, Apply 1 Application topically 2 (two) times daily., Disp: 22 g, Rfl: 0   Pitavastatin  Calcium  4 MG TABS, Take 1 tablet (4 mg total) by mouth daily., Disp: 30 tablet, Rfl: 0   polyethylene glycol (MIRALAX  / GLYCOLAX ) 17 g packet, Take 17 g by mouth daily., Disp: 30 each, Rfl: 0   thiamine  100 MG tablet, Take 1 tablet (100 mg total) by mouth daily., Disp: 30 tablet, Rfl: 0   Review of Systems     Objective:   Physical Exam        Assessment & Plan:

## 2023-06-16 ENCOUNTER — Ambulatory Visit: Admitting: Infectious Disease

## 2023-06-16 DIAGNOSIS — Z113 Encounter for screening for infections with a predominantly sexual mode of transmission: Secondary | ICD-10-CM

## 2023-06-16 DIAGNOSIS — Z7185 Encounter for immunization safety counseling: Secondary | ICD-10-CM

## 2023-06-16 DIAGNOSIS — E785 Hyperlipidemia, unspecified: Secondary | ICD-10-CM

## 2023-06-16 DIAGNOSIS — Z124 Encounter for screening for malignant neoplasm of cervix: Secondary | ICD-10-CM

## 2023-06-16 DIAGNOSIS — B20 Human immunodeficiency virus [HIV] disease: Secondary | ICD-10-CM

## 2023-06-16 DIAGNOSIS — K7010 Alcoholic hepatitis without ascites: Secondary | ICD-10-CM

## 2023-07-08 ENCOUNTER — Telehealth: Payer: Self-pay

## 2023-07-08 NOTE — Telephone Encounter (Signed)
 Attempt to Re-Engage in Care  No office visit or HIV labs completed at [facility] within the last 12 months. Patient is considered out of care.   Last [facility] Visit: 01-08-22  Last HIV Viral Load:  HIV 1 RNA Quant  Date Value Ref Range Status  12/09/2021 Not Detected Copies/mL Final    Last CD4 Count:  CD4 T Cell Abs  Date Value Ref Range Status  12/09/2021 670 400 - 1,790 /uL Final    Medication Dispense History:   Dispensed Days Supply Quantity Provider Pharmacy  ODEFSEY  (FTC200/RPV25/TAF25) TAB 05/26/2023 30 30 each Ernie Heal, Jerelyn Money, MD [facility]...  ODEFSEY  (FTC200/RPV25/TAF25) TAB 04/14/2023 30 30 each Ernie Heal, Jerelyn Money, MD [facility]...  ODEFSEY  (FTC200/RPV25/TAF25) TAB 02/24/2023 30 30 each Ernie Heal, Jerelyn Money, MD [facility]...    Interventions: Called Rosalind to offer follow up appointment, no answer and voicemail full. Will send MyChart message.   Duration of Services: 5 minutes  Kendricks Reap, BSN, Charity fundraiser

## 2023-07-11 ENCOUNTER — Other Ambulatory Visit: Payer: Self-pay | Admitting: Infectious Disease

## 2023-07-11 DIAGNOSIS — B20 Human immunodeficiency virus [HIV] disease: Secondary | ICD-10-CM

## 2023-08-12 NOTE — Telephone Encounter (Signed)
 Patient returned call, scheduled with Dr. Fleeta Rothman in next available slot (8/11).   Julia Molina has been without her Odefsey  for about 30 days and has noticed that she has been breaking out in hives on her arms when she scratches since being out of medication.   Trinton Prewitt, BSN, RN

## 2023-08-13 ENCOUNTER — Ambulatory Visit: Admitting: Internal Medicine

## 2023-08-13 NOTE — Telephone Encounter (Addendum)
 Patient called stating she felt bad without her Odefsey . Patient was offered a sooner appointment with Dr. Overton today and transportation also set for the patient to come today. I attempted to reach the patient several times to let he know we had gotten transportation set for her to come. Patient did not answer.   After speaking to the patient after her appointment time she reported that transportation showed up but she didn't know they were coming. Patient did not show for the appointment today.  Joyceann Kruser ONEIDA Ligas, CMA

## 2023-09-12 NOTE — Progress Notes (Signed)
 [facility] Nephrology Progress Note  Follow up for chronic hyponatremia.  Hospital Day: 10  Subjective: Interval History: Tmax 102.2, tachycardic and hypotensive. Chronic fatigue, somnolent.  Review of Systems: negative except as noted above  Objective:   Vital signs for last 24 hours: Temp:  [98.4 F (36.9 C)-102.2 F (39 C)] 99.5 F (37.5 C) Heart Rate:  [86-123] 94 Resp:  [18] 18 BP: (101-126)/(66-91) 101/66 Today's weight: 61.7 kg (136 lb 0.4 oz)  Intake/Output last 24 hours:  Intake/Output Summary (Last 24 hours) at 09/12/2023 1244 Last data filed at 09/12/2023 1126 Gross per 24 hour  Intake 420 ml  Output 500 ml  Net -80 ml    Medications: Scheduled Meds: emtricitab-rilpivir-tenofo ala, 1 tablet, oral, Daily enoxaparin, 40 mg, subcutaneous, At Bedtime folic acid , 1 mg, oral, Daily iohexoL, 80 mL, intravenous, Once in imaging metoprolol tartrate, 50 mg, oral, BID omeprazole, 20 mg, oral, Daily 0600 sodium chloride , 2 g, oral, TID with meals multivitamin, 1 tablet, oral, Daily thiamine , 100 mg, oral, Daily    PRN Meds: .  acetaminophen  .  benzonatate  .  dextrose .  dextrose .  hydrALAZINE  .  LORazepam  .  melatonin .  ondansetron  .  ondansetron    Physical Exam: General appearance: More somnolent, arousable. Head: normocephalic, atraumatic Eyes: Sclera anicteric, no conjunctival pallor Lungs: Decreased bilateral breath sounds present. Heart: s1s2 present, regular rate and rhythm, no murmurs Abdomen: is flat, soft, no tenderness, bowel sounds present. Extremities: extremities normal, atraumatic, no cyanosis or edema Skin: Skin color, texture, turgor normal. No rashes or lesions Neurologic: Lethargic and somnolent.   Labs Recent Results (from the past 48 hours)  Basic Metabolic Panel   Collection Time: 09/11/23  9:45 AM  Result Value Ref Range   Sodium 124 (L) 136 - 145 mmol/L   Potassium 3.8 3.4 - 4.5 mmol/L   Chloride 93 (L) 98 - 107 mmol/L   CO2  24 21 - 31 mmol/L   Anion Gap 7 6 - 14 mmol/L   Glucose, Random 123 (H) 70 - 99 mg/dL   Blood Urea Nitrogen (BUN) 5 (L) 7 - 25 mg/dL   Creatinine 9.56 (L) 9.39 - 1.20 mg/dL   eGFR >09 >40 fO/fpw/8.26f7   Calcium  9.0 8.6 - 10.3 mg/dL   BUN/Creatinine Ratio 11.6 10.0 - 20.0  Ammonia   Collection Time: 09/12/23 11:18 AM  Result Value Ref Range   Ammonia 64 18 - 72 umol/L  Comprehensive Metabolic Panel   Collection Time: 09/12/23 11:18 AM  Result Value Ref Range   Sodium 126 (L) 136 - 145 mmol/L   Potassium 4.0 3.4 - 4.5 mmol/L   Chloride 96 (L) 98 - 107 mmol/L   CO2 22 21 - 31 mmol/L   Anion Gap 8 6 - 14 mmol/L   Glucose, Random 172 (H) 70 - 99 mg/dL   Blood Urea Nitrogen (BUN) 11 7 - 25 mg/dL   Creatinine 9.38 9.39 - 1.20 mg/dL   eGFR >09 >40 fO/fpw/8.26f7   Albumin 3.1 (L) 3.5 - 5.7 g/dL   Total Protein 7.5 6.4 - 8.9 g/dL   Bilirubin, Total 0.3 0.3 - 1.0 mg/dL   Alkaline Phosphatase (ALP) 64 34 - 104 U/L   Aspartate Aminotransferase (AST) 22 13 - 39 U/L   Alanine Aminotransferase (ALT) 23 7 - 52 U/L   Calcium  9.0 8.6 - 10.3 mg/dL   BUN/Creatinine Ratio     Corrected Calcium  9.7 mg/dL  CBC without Differential   Collection Time: 09/12/23 11:18 AM  Result Value Ref Range   WBC 32.29 (H) 4.40 - 11.00 10*3/uL   RBC 3.91 (L) 4.10 - 5.10 10*6/uL   Hemoglobin 11.9 (L) 12.3 - 15.3 g/dL   Hematocrit 65.3 (L) 64.0 - 44.6 %   Mean Corpuscular Volume (MCV) 88.4 80.0 - 96.0 fL   Mean Corpuscular Hemoglobin (MCH) 30.4 27.5 - 33.2 pg   Mean Corpuscular Hemoglobin Conc (MCHC) 34.3 33.0 - 37.0 g/dL   Red Cell Distribution Width (RDW) 13.6 12.3 - 17.0 %   Platelet Count (PLT) 294 150 - 450 10*3/uL   Mean Platelet Volume (MPV) 7.0 6.8 - 10.2 fL     Imaging: Radiology Results (last 21 days)     Procedure Component Value Units Date/Time   CT Abdomen Pelvis W Contrast [[id number]] Collected: 09/03/23 0559   Order Status: Completed Updated: 09/03/23 0611   Narrative:     EXAM: CT  ABDOMEN AND PELVIS WITH CONTRAST 09/03/2023 04:03:30 AM  TECHNIQUE: CT of the abdomen and pelvis was performed with the administration of intravenous contrast. Multiplanar reformatted images are provided for review. Automated exposure control, iterative reconstruction, and/or weight based adjustment of the mA/kV was utilized to reduce the radiation dose to as low as reasonably achievable.  COMPARISON: MRI of the abdomen from 05/02/2020.  CLINICAL HISTORY: Abdominal pain, acute, nonlocalized.  FINDINGS:  LOWER CHEST: Progressive Bilateral posterior lower lobe atelectasis versus airspace disease. No pleural effusion identified. Ground glass attenuating nodule within the previous 7 mm nodule within the left upper lobe is blurred by motion artifact image 5/5.  LIVER: Subjective hepatic steatosis. No suspicious liver abnormality.  GALLBLADDER AND BILE DUCTS: Gallbladder appears decompressed with gallbladder wall enhancement and mild thickening measuring up to 4 mm image 33/2. The common bile duct is prominent, measuring up to 7 mm in thickness. No calcified common bile duct stones or significant intrahepatic bile duct dilatation.  SPLEEN: No acute abnormality.  PANCREAS: No acute abnormality.  ADRENAL GLANDS: No acute abnormality.  KIDNEYS, URETERS AND BLADDER: No stones in the kidneys or ureters. No hydronephrosis. No perinephric or periureteral stranding. Foley catheter identified within the urinary bladder. Gas within the lumen of the bladder is likely related to instrumentation.  GI AND BOWEL: The appendix is visualized and appears normal. No pathologic dilatation of the large or small bowel loops. No bowel wall thickening.  PERITONEUM AND RETROPERITONEUM: No free fluid or fluid collections. Small fat-containing umbilical hernia. No signs of pneumoperitoneum.  VASCULATURE: Mild aortic atherosclerosis.  LYMPH NODES: No abdominal or pelvic  adenopathy.  REPRODUCTIVE ORGANS: Posterior myometrial calcification likely related to degenerative fibroid. No adnexal mass.  BONES AND SOFT TISSUES: No acute osseous abnormality. No focal soft tissue abnormality.    Impression:     1. No definite acute findings identified to account for the patient's abdominal pain. 2. . Bilateral posterior lower lobe consolidation/atelectasis which appears progressive compared with 09/02/23 Correlate for any clinical signs or symptoms of pneumonia or aspiration. 3. Decompressed gallbladder Gallbladder wall enhancement and mild thickening, likely related to incomplete distention. Correlate for any clinical signs or symptoms of acute cholecystitis. 4. prominent common bile duct measuring up to 7 mm. No calcified common bile duct stones or significant intrahepatic bile duct dilatation. 5. Aortic atherosclerotic calcification.  Electronically signed by: Waddell Calk MD 09/03/2023 06:09 AM EDT RP Workstation: [id number]    CT Chest WO Contrast [[id number]] Collected: 09/02/23 1725   Order Status: Completed Updated: 09/02/23 1733   Narrative:     CLINICAL DATA:  History  of HIV. Not on meds. Weight loss. Abnormal chest radiograph. Generalized weakness for the last week.  EXAM: CT CHEST WITHOUT CONTRAST  TECHNIQUE: Multidetector CT imaging of the chest was performed following the standard protocol without IV contrast.  RADIATION DOSE REDUCTION: This exam was performed according to the departmental dose-optimization program which includes automated exposure control, adjustment of the mA and/or kV according to patient size and/or use of iterative reconstruction technique.  COMPARISON:  Chest radiograph 09/02/2023 and 03/18/2021  FINDINGS: Cardiovascular: No significant vascular findings. Normal heart size. No pericardial effusion.  Mediastinum/Nodes: No enlarged mediastinal or axillary lymph nodes. Thyroid gland, trachea, and esophagus  demonstrate no significant findings.  Lungs/Pleura: Emphysematous changes and scarring in the lung apices with scattered bullous changes in the anterior lungs. Focal linear scarring in the mid lungs. Patchy airspace infiltrates in both lung bases. This may represent edema or pneumonia. No pleural effusion or pneumothorax. Several scattered pulmonary nodules are present. Most prominent is in the right middle lung, series 4, image 56, measuring 2.5 mm diameter.  Upper Abdomen: No acute abnormalities.  Musculoskeletal: No chest wall mass or suspicious bone lesions identified.    Impression:     1. Scattered emphysematous changes and scarring in the lungs. 2. Linear scarring in the lung bases likely corresponds to chest radiographic abnormality. 3. Airspace infiltrates in both lung bases may represent edema or pneumonia. 4. 2.5 mm right middle lung nodule. Scattered smaller nodules. No follow-up needed if patient is low-risk (and has no known or suspected primary neoplasm). Non-contrast chest CT can be considered in 12 months if patient is high-risk. This recommendation follows the consensus statement: Guidelines for Management of Incidental Pulmonary Nodules Detected on CT Images: From the Fleischner Society 2017; Radiology 2017; 284:228-243.   Electronically Signed   By: Elsie Gravely M.D.   On: 09/02/2023 17:30    CT Head WO Contrast [[id number]] Collected: 09/02/23 1726   Order Status: Completed Updated: 09/02/23 1731   Narrative:     CLINICAL DATA:  Mental status change, unknown cause  EXAM: CT HEAD WITHOUT CONTRAST  TECHNIQUE: Contiguous axial images were obtained from the base of the skull through the vertex without intravenous contrast.  RADIATION DOSE REDUCTION: This exam was performed according to the departmental dose-optimization program which includes automated exposure control, adjustment of the mA and/or kV according to patient size and/or use of  iterative reconstruction technique.  COMPARISON:  None Available.  FINDINGS: Brain: No acute intracranial abnormality. Specifically, no hemorrhage, hydrocephalus, mass lesion, acute infarction, or significant intracranial injury.  Vascular: No hyperdense vessel or unexpected calcification.  Skull: No acute calvarial abnormality.  Sinuses/Orbits: No acute findings  Other: None    Impression:     No acute intracranial abnormality.   Electronically Signed   By: Franky Crease M.D.   On: 09/02/2023 17:29    XR Chest 1 View [[id number]] Collected: 09/02/23 1439   Order Status: Completed Updated: 09/02/23 1442   Narrative:     EXAM: 1 VIEW XRAY OF THE CHEST 09/02/2023 02:31:00 PM  COMPARISON: 03/18/2021  CLINICAL HISTORY: Weakness.  FINDINGS:  LUNGS AND PLEURA: New linear opacity within the right lower lung compatible with atelectasis or scar. No pulmonary edema. No pleural effusion. No pneumothorax.  HEART AND MEDIASTINUM: No acute abnormality of the cardiac and mediastinal silhouettes.  BONES AND SOFT TISSUES: No acute osseous abnormality.    Impression:     1. New linear opacity within the right lower lung compatible with atelectasis or scar.  Electronically signed by: Waddell Calk MD 09/02/2023 02:40 PM EDT RP Workstation: [id number]           Assessment/Plan: 1) Hyponatremia-likely multifactorial from decreased solute intake, anorexia, failure to thrive, p.o. fluids.  Reached nadir sodium of 115, trended up to 122 following 3% saline infusion and reached a peak sodium of 135 yesterday with dosing of tolvaptan.  Worsening sodium today following transient IV fluids resuscitation for volume depletion due to desalination.  Improving on salt tabs. 2) Failure to thrive-continue supplements, supportive care. 3) Alcohol use disorder- CIWA protocol, monitor for withdrawal symptoms. 4) Community-acquired pneumonia-on antibiotics 5) HIV/AIDS-low CD4 count.   Started on intravenous emtricitabine , tenofovir ,rilpivir therapy. 6) Hypokalemia-improved with oral repletion. 7) Hypomagnesemia-resolved following IV repletion 2 g mag sulfate bolus. 8) Hypertension with diastolic elevation in blood pressure-May need to increase metoprolol dose with addition of salt tabs. 9) Sepsis-unclear source, worsening leukocytosis. ? Pancultures. 10)Alcohol use disorder.   Howell King MD, FASN 09/12/2023 12:44 PM

## 2023-09-12 NOTE — Progress Notes (Signed)
   09/12/23 1440  Reason Eval/Treat Not Completed  Reason Eval/Treat Not Completed Pt refused (Upon arrival, patient turns back to PTA and ignores all questions despite introduction and max encourgement to participate with therapy. Patient noted with whole body tremors and let patient know we will follow up per POC.)

## 2023-09-13 NOTE — Progress Notes (Signed)
 [facility] Progress Note  Follow up for chronic hyponatremia.  Hospital Day: 11  Subjective: Interval History: Tmax 102.9, tachycardic and blood pressure improved.  Afebrile this morning. More awake and responsive this morning. Cough with sputum production improving.  Review of Systems: negative except as noted above  Objective:   Vital signs for last 24 hours: Temp:  [98.8 F (37.1 C)-102.9 F (39.4 C)] 98.8 F (37.1 C) Heart Rate:  [95-125] 95 Resp:  [18] 18 BP: (116-135)/(84-98) 118/84 Today's weight: 61.7 kg (136 lb 0.4 oz)  Intake/Output last 24 hours:  Intake/Output Summary (Last 24 hours) at 09/13/2023 1157 Last data filed at 09/13/2023 1115 Gross per 24 hour  Intake 757 ml  Output 1 ml  Net 756 ml    Medications: Scheduled Meds: emtricitab-rilpivir-tenofo ala, 1 tablet, oral, Daily cefepime, 2 g, intravenous, Q8H enoxaparin, 40 mg, subcutaneous, At Bedtime folic acid , 1 mg, oral, Daily iohexoL, 80 mL, intravenous, Once in imaging metoprolol tartrate, 50 mg, oral, BID omeprazole, 20 mg, oral, Daily 0600 sodium chloride , 2 g, oral, TID with meals multivitamin, 1 tablet, oral, Daily thiamine , 100 mg, oral, Daily vancomycin, 1,000 mg, intravenous, Q12H Pharmacy to Dose Vancomycin, , miscellaneous, Pharmacy to Manage    PRN Meds: .  acetaminophen  .  benzonatate  .  dextrose .  dextrose .  hydrALAZINE  .  LORazepam  .  melatonin .  ondansetron  .  ondansetron    Physical Exam: General appearance: More somnolent, arousable. Head: normocephalic, atraumatic Eyes: Sclera anicteric, no conjunctival pallor Lungs: Decreased bilateral breath sounds present. Heart: s1s2 present, regular rate and rhythm, no murmurs Abdomen: is flat, soft, no tenderness, bowel sounds present. Extremities: extremities normal, atraumatic, no cyanosis or edema Skin: Skin color, texture, turgor normal. No rashes or lesions Neurologic: More awake and alert  today.   Labs Recent Results (from the past 48 hours)  Ammonia   Collection Time: 09/12/23 11:18 AM  Result Value Ref Range   Ammonia 64 18 - 72 umol/L  Comprehensive Metabolic Panel   Collection Time: 09/12/23 11:18 AM  Result Value Ref Range   Sodium 126 (L) 136 - 145 mmol/L   Potassium 4.0 3.4 - 4.5 mmol/L   Chloride 96 (L) 98 - 107 mmol/L   CO2 22 21 - 31 mmol/L   Anion Gap 8 6 - 14 mmol/L   Glucose, Random 172 (H) 70 - 99 mg/dL   Blood Urea Nitrogen (BUN) 11 7 - 25 mg/dL   Creatinine 9.38 9.39 - 1.20 mg/dL   eGFR >09 >40 fO/fpw/8.26f7   Albumin 3.1 (L) 3.5 - 5.7 g/dL   Total Protein 7.5 6.4 - 8.9 g/dL   Bilirubin, Total 0.3 0.3 - 1.0 mg/dL   Alkaline Phosphatase (ALP) 64 34 - 104 U/L   Aspartate Aminotransferase (AST) 22 13 - 39 U/L   Alanine Aminotransferase (ALT) 23 7 - 52 U/L   Calcium  9.0 8.6 - 10.3 mg/dL   BUN/Creatinine Ratio     Corrected Calcium  9.7 mg/dL  CBC without Differential   Collection Time: 09/12/23 11:18 AM  Result Value Ref Range   WBC 32.29 (H) 4.40 - 11.00 10*3/uL   RBC 3.91 (L) 4.10 - 5.10 10*6/uL   Hemoglobin 11.9 (L) 12.3 - 15.3 g/dL   Hematocrit 65.3 (L) 64.0 - 44.6 %   Mean Corpuscular Volume (MCV) 88.4 80.0 - 96.0 fL   Mean Corpuscular Hemoglobin (MCH) 30.4 27.5 - 33.2 pg   Mean Corpuscular Hemoglobin Conc (MCHC) 34.3 33.0 - 37.0 g/dL   Red Cell  Distribution Width (RDW) 13.6 12.3 - 17.0 %   Platelet Count (PLT) 294 150 - 450 10*3/uL   Mean Platelet Volume (MPV) 7.0 6.8 - 10.2 fL  Urinalysis with Reflex to Microscopic   Collection Time: 09/12/23 11:40 PM  Result Value Ref Range   Color, Urine Yellow Yellow   Clarity, Urine Clear Clear   Specific Gravity, Urine 1.031 (H) 1.005 - 1.025   pH, Urine 7.0 5.0 - 8.0   Protein, Urine 100 (A) Negative, 10 , 20  mg/dL   Glucose, Urine Negative Negative, 30 , 50  mg/dL   Ketones, Urine Negative Negative, Trace mg/dL   Bilirubin, Urine Negative Negative   Blood, Urine Negative Negative, Trace    Nitrite, Urine Negative Negative   Leukocyte Esterase, Urine Negative Negative, 25   Urobilinogen, Urine Normal <2.0 mg/dL   WBC, Urine 0-5 <6 /HPF   RBC, Urine 3-5 (A) 0 - 2 /HPF   Bacteria, Urine None Seen None Seen, Rare /HPF   Squamous Epithelial Cells, Urine 0-5 0 - 5 /HPF  Comprehensive Metabolic Panel   Collection Time: 09/13/23  6:08 AM  Result Value Ref Range   Sodium 131 (L) 136 - 145 mmol/L   Potassium 3.6 3.4 - 4.5 mmol/L   Chloride 101 98 - 107 mmol/L   CO2 24 21 - 31 mmol/L   Anion Gap 6 6 - 14 mmol/L   Glucose, Random 120 (H) 70 - 99 mg/dL   Blood Urea Nitrogen (BUN) 14 7 - 25 mg/dL   Creatinine 9.46 (L) 9.39 - 1.20 mg/dL   eGFR >09 >40 fO/fpw/8.26f7   Albumin 2.8 (L) 3.5 - 5.7 g/dL   Total Protein 7.1 6.4 - 8.9 g/dL   Bilirubin, Total 0.4 0.3 - 1.0 mg/dL   Alkaline Phosphatase (ALP) 84 34 - 104 U/L   Aspartate Aminotransferase (AST) 16 13 - 39 U/L   Alanine Aminotransferase (ALT) 17 7 - 52 U/L   Calcium  8.5 (L) 8.6 - 10.3 mg/dL   BUN/Creatinine Ratio 26.4 (H) 10.0 - 20.0   Corrected Calcium  9.5 mg/dL  CBC without Differential   Collection Time: 09/13/23  6:08 AM  Result Value Ref Range   WBC 33.25 (H) 4.40 - 11.00 10*3/uL   RBC 3.46 (L) 4.10 - 5.10 10*6/uL   Hemoglobin 10.8 (L) 12.3 - 15.3 g/dL   Hematocrit 68.1 (L) 64.0 - 44.6 %   Mean Corpuscular Volume (MCV) 92.0 80.0 - 96.0 fL   Mean Corpuscular Hemoglobin (MCH) 31.3 27.5 - 33.2 pg   Mean Corpuscular Hemoglobin Conc (MCHC) 34.0 33.0 - 37.0 g/dL   Red Cell Distribution Width (RDW) 13.5 12.3 - 17.0 %   Platelet Count (PLT) 269 150 - 450 10*3/uL   Mean Platelet Volume (MPV) 7.3 6.8 - 10.2 fL     Imaging: Radiology Results (last 21 days)     Procedure Component Value Units Date/Time   CT Abdomen Pelvis W Contrast [[id number]] Collected: 09/03/23 0559   Order Status: Completed Updated: 09/03/23 0611   Narrative:     EXAM: CT ABDOMEN AND PELVIS WITH CONTRAST 09/03/2023 04:03:30  AM  TECHNIQUE: CT of the abdomen and pelvis was performed with the administration of intravenous contrast. Multiplanar reformatted images are provided for review. Automated exposure control, iterative reconstruction, and/or weight based adjustment of the mA/kV was utilized to reduce the radiation dose to as low as reasonably achievable.  COMPARISON: MRI of the abdomen from 05/02/2020.  CLINICAL HISTORY: Abdominal pain, acute, nonlocalized.  FINDINGS:  LOWER  CHEST: Progressive Bilateral posterior lower lobe atelectasis versus airspace disease. No pleural effusion identified. Ground glass attenuating nodule within the previous 7 mm nodule within the left upper lobe is blurred by motion artifact image 5/5.  LIVER: Subjective hepatic steatosis. No suspicious liver abnormality.  GALLBLADDER AND BILE DUCTS: Gallbladder appears decompressed with gallbladder wall enhancement and mild thickening measuring up to 4 mm image 33/2. The common bile duct is prominent, measuring up to 7 mm in thickness. No calcified common bile duct stones or significant intrahepatic bile duct dilatation.  SPLEEN: No acute abnormality.  PANCREAS: No acute abnormality.  ADRENAL GLANDS: No acute abnormality.  KIDNEYS, URETERS AND BLADDER: No stones in the kidneys or ureters. No hydronephrosis. No perinephric or periureteral stranding. Foley catheter identified within the urinary bladder. Gas within the lumen of the bladder is likely related to instrumentation.  GI AND BOWEL: The appendix is visualized and appears normal. No pathologic dilatation of the large or small bowel loops. No bowel wall thickening.  PERITONEUM AND RETROPERITONEUM: No free fluid or fluid collections. Small fat-containing umbilical hernia. No signs of pneumoperitoneum.  VASCULATURE: Mild aortic atherosclerosis.  LYMPH NODES: No abdominal or pelvic adenopathy.  REPRODUCTIVE ORGANS: Posterior myometrial calcification  likely related to degenerative fibroid. No adnexal mass.  BONES AND SOFT TISSUES: No acute osseous abnormality. No focal soft tissue abnormality.    Impression:     1. No definite acute findings identified to account for the patient's abdominal pain. 2. . Bilateral posterior lower lobe consolidation/atelectasis which appears progressive compared with 09/02/23 Correlate for any clinical signs or symptoms of pneumonia or aspiration. 3. Decompressed gallbladder Gallbladder wall enhancement and mild thickening, likely related to incomplete distention. Correlate for any clinical signs or symptoms of acute cholecystitis. 4. prominent common bile duct measuring up to 7 mm. No calcified common bile duct stones or significant intrahepatic bile duct dilatation. 5. Aortic atherosclerotic calcification.  Electronically signed by: Waddell Calk MD 09/03/2023 06:09 AM EDT RP Workstation: [id number]    CT Chest WO Contrast [[id number]] Collected: 09/02/23 1725   Order Status: Completed Updated: 09/02/23 1733   Narrative:     CLINICAL DATA:  History of HIV. Not on meds. Weight loss. Abnormal chest radiograph. Generalized weakness for the last week.  EXAM: CT CHEST WITHOUT CONTRAST  TECHNIQUE: Multidetector CT imaging of the chest was performed following the standard protocol without IV contrast.  RADIATION DOSE REDUCTION: This exam was performed according to the departmental dose-optimization program which includes automated exposure control, adjustment of the mA and/or kV according to patient size and/or use of iterative reconstruction technique.  COMPARISON:  Chest radiograph 09/02/2023 and 03/18/2021  FINDINGS: Cardiovascular: No significant vascular findings. Normal heart size. No pericardial effusion.  Mediastinum/Nodes: No enlarged mediastinal or axillary lymph nodes. Thyroid gland, trachea, and esophagus demonstrate no significant findings.  Lungs/Pleura: Emphysematous  changes and scarring in the lung apices with scattered bullous changes in the anterior lungs. Focal linear scarring in the mid lungs. Patchy airspace infiltrates in both lung bases. This may represent edema or pneumonia. No pleural effusion or pneumothorax. Several scattered pulmonary nodules are present. Most prominent is in the right middle lung, series 4, image 56, measuring 2.5 mm diameter.  Upper Abdomen: No acute abnormalities.  Musculoskeletal: No chest wall mass or suspicious bone lesions identified.    Impression:     1. Scattered emphysematous changes and scarring in the lungs. 2. Linear scarring in the lung bases likely corresponds to chest radiographic abnormality. 3. Airspace infiltrates in  both lung bases may represent edema or pneumonia. 4. 2.5 mm right middle lung nodule. Scattered smaller nodules. No follow-up needed if patient is low-risk (and has no known or suspected primary neoplasm). Non-contrast chest CT can be considered in 12 months if patient is high-risk. This recommendation follows the consensus statement: Guidelines for Management of Incidental Pulmonary Nodules Detected on CT Images: From the Fleischner Society 2017; Radiology 2017; 284:228-243.   Electronically Signed   By: Elsie Gravely M.D.   On: 09/02/2023 17:30    CT Head WO Contrast [[id number]] Collected: 09/02/23 1726   Order Status: Completed Updated: 09/02/23 1731   Narrative:     CLINICAL DATA:  Mental status change, unknown cause  EXAM: CT HEAD WITHOUT CONTRAST  TECHNIQUE: Contiguous axial images were obtained from the base of the skull through the vertex without intravenous contrast.  RADIATION DOSE REDUCTION: This exam was performed according to the departmental dose-optimization program which includes automated exposure control, adjustment of the mA and/or kV according to patient size and/or use of iterative reconstruction technique.  COMPARISON:  None  Available.  FINDINGS: Brain: No acute intracranial abnormality. Specifically, no hemorrhage, hydrocephalus, mass lesion, acute infarction, or significant intracranial injury.  Vascular: No hyperdense vessel or unexpected calcification.  Skull: No acute calvarial abnormality.  Sinuses/Orbits: No acute findings  Other: None    Impression:     No acute intracranial abnormality.   Electronically Signed   By: Franky Crease M.D.   On: 09/02/2023 17:29    XR Chest 1 View [[id number]] Collected: 09/02/23 1439   Order Status: Completed Updated: 09/02/23 1442   Narrative:     EXAM: 1 VIEW XRAY OF THE CHEST 09/02/2023 02:31:00 PM  COMPARISON: 03/18/2021  CLINICAL HISTORY: Weakness.  FINDINGS:  LUNGS AND PLEURA: New linear opacity within the right lower lung compatible with atelectasis or scar. No pulmonary edema. No pleural effusion. No pneumothorax.  HEART AND MEDIASTINUM: No acute abnormality of the cardiac and mediastinal silhouettes.  BONES AND SOFT TISSUES: No acute osseous abnormality.    Impression:     1. New linear opacity within the right lower lung compatible with atelectasis or scar.  Electronically signed by: Waddell Calk MD 09/02/2023 02:40 PM EDT RP Workstation: [id number]           Assessment/Plan: 1) Hyponatremia-likely multifactorial from decreased solute intake, anorexia, failure to thrive, p.o. fluids.  Reached nadir sodium of 115, trended up to 122 following 3% saline infusion and reached a peak sodium of 135 with dosing of tolvaptan.  Sodium improving on salt tablets and off IV fluids.  Continue to encourage p.o. intake.  2) Failure to thrive-continue supplements, supportive care. 3) Alcohol use disorder- CIWA protocol, monitor for withdrawal symptoms. 4) Community-acquired pneumonia-elevated WBC, restarted on antibiotics 09/12/2023 for possible hospital-acquired pneumonia with improving symptoms. 5) HIV/AIDS-low CD4 count.  Started on  intravenous emtricitabine , tenofovir ,rilpivir therapy. 6) Hypokalemia/hypomagnesemia-resolved with repletion. 7) Hypertension with diastolic elevation in blood pressure-May need to increase metoprolol dose with addition of salt tabs. 8) Sepsis-likely due to hospital-acquired pneumonia in the setting of chronic immunosuppression.   9)Alcohol use disorder.   Howell King MD, FASN 09/13/2023 11:57 AM

## 2023-09-14 ENCOUNTER — Ambulatory Visit: Admitting: Infectious Disease

## 2023-09-21 NOTE — Discharge Summary (Signed)
 Hospital Medicine Discharge Summary   Demographics: Julia Molina  58 y.o. 1965-10-11 MRN: [id number]    Extended Emergency Contact Information Primary Emergency Contact: Loudenslager,chasity Mobile Phone: [phone] Relation: Daughter  Full Code  Admit Date: 09/02/2023                            Attending Physician: Jama Rome Louder, MD Discharge Date: 09/21/2023  Primary Care Provider: Jomarie Noreen Fleeta Kathie, MD   [phone]  Consults during this admission: Consult Orders             IP CONSULT TO INTENSIVIST       Specialty:  Critical Care (Intensivists)  Provider:  (Not yet assigned)      IP CONSULT TO NEPHROLOGY       Specialty:  Nephrology  Provider:  (Not yet assigned)      IP CONSULT TO INFECTIOUS DISEASES       Specialty:  Infectious Diseases  Provider:  (Not yet assigned)              Active & Resolved Diagnosis: Principal Problem:   Failure to thrive in adult Active Problems:   Impaired functional mobility, balance, gait, and endurance Resolved Problems:   * No resolved hospital problems. *   Disposition: Patient discharged to home   History of present illness taken from history and physical dictated 7/30: 58 y.o. female  has a past medical history of HIV (human immunodeficiency virus infection)    (CMD) and Hyperlipidemia.    58 year old female with history of HIV, noncompliance with medication, chronic alcohol use presented to ED with daughter from home with failure to thrive.  She has poor p.o. intake, has generalized weakness for past 2 weeks, mostly bedbound for past 2 weeks.  Per daughter she also lost about 10 to 20 pounds in the last month.  She usually drinks 3 - 40 ounce beers daily has not been drinking for the last 2 weeks per daughter, she smokes cigars daily.    She is not taking her medications per daughter, unknown amount of duration. She is unsure if she ran out or just simply not taking meds.  Daughter not sure how longs she's not  seen her primary care or infectious disease.    Per chart review, patient has not seen infectious disease for past 1 year.  Most recent refill for HIV drugs was not done.  In the past year she followed with ID in [facility] Dr. Megan Burroughs.   Per RN patient having foul-smelling diarrhea, unknown duration of diarrhea.  Given immunocompromise not taking ART therapy will check C. difficile and GI panel.   Afebrile, Temp 99.2, pulse 115, RR 22, BP 160s/120s, oxygen sats 95 % on room air.    On arrival to ER patient was hypotensive received 1 L fluid bolus.  In the ER he was tachycardic with elevated blood pressure.  Received additional 1 L fluid bolus.  Given Ativan  per CIWA protocol.  Blood pressure came down to 116 systolic, given additional 1 L fluid bolus.  Hospital Course: Community-acquired pneumonia although patient initially completed antibiotic course with 7 days of IV antibiotics, on 8/9, patient was noted to have temperature elevation to 102.9 degrees.  Broad-spectrum antibiotics were reinitiated and patient completed extended course of antibiotics..  At time of discharge, patient is afebrile and white blood cell count is within normal limits.  Greatly appreciate assistance from infectious disease team  Severe protein  caloric malnutrition/poor oral intake likely secondary to alcohol use disorder.  Oral intake has improved and patient is encouraged to maintain adequate oral intake   Somnolence early during hospitalization, patient had waxing and waning sensorium which was felt secondary to active infection and/or Warnicke's encephalopathy with hospital-acquired delirium superimposed.  Alertness has improved   Hypomagnesemia and hypokalemia continue magnesium  oral replacement   Hyponatremia alcohol use disorder is likely the etiology for patient's hyponatremia.  Patient briefly required admission to intensive care unit for 3% saline infusion.  Greatly appreciate assistance from nephrology  team for adjusted medications to help improve control of hyponatremia.  Patient will continue with sodium supplement and fluid restriction following discharge from the hospital.  We will arrange for outpatient nephrology evaluation to monitor sodium level   Impaired functional status with gait and balance disturbance continue working with occupational and physical therapy team for strengthening.  Unfortunately, substance use disorder has been a prohibitive of facilities being able to accept patient.  Functional status has improved and patient is being discharged home   Hypertension, essential during hospitalization, patient did have episodes of hypotension likely secondary to active infection.  Following treatment, blood pressure has returned to her baseline and antihypertensive medications have been reinitiated   HIV refills for medications sent to pharmacy of patient's choice   Alcohol use disorder cessation strongly recommend               Wound / Incision Assessment: Refer to Chart Review and Media Tab for images if available.      Temp:  [97.5 F (36.4 C)-98.7 F (37.1 C)] 98.7 F (37.1 C) Heart Rate:  [93-99] 96 Resp:  [18-20] 18 BP: (139-163)/(96-109) 151/100  General appearance -awake and alert.  Appears comfortable with no acute distress Mental status -patient is answering questions appropriately Eyes - pupils equal and reactive, extraocular eye movements intact Mouth - mucous membranes moist, pharynx normal without lesions Chest -bilaterally clear to auscultation Heart - normal rate, regular rhythm, normal S1, S2, no murmurs, rubs, clicks or gallops Abdomen - soft, nontender, nondistended, no masses or organomegaly        Discharge Medications     New Medications      Sig Disp Refill Start End  acetaminophen  325 mg tablet Commonly known as: TYLENOL   Take 2 tablets (650 mg total) by mouth every 6 (six) hours as needed for mild pain (1-3), headaches or fever  100.4 F or GREATER (Fever GREATER THAN 100.4 F(38 C)).   0     carvediloL 12.5 mg tablet Commonly known as: COREG  Take 1 tablet (12.5 mg total) by mouth in the morning and 1 tablet (12.5 mg total) in the evening. Take with meals.  180 tablet  0     emtricitab-rilpivir-tenofo ala 200-25-25 mg Tab per tablet Commonly known as: ODEFSEY   Take 1 tablet by mouth daily.  30 tablet  2     folic acid  800 mcg tablet Commonly known as: FOLVITE   Take 1 tablet (0.8 mg total) by mouth daily.   0  September 22, 2023    losartan 100 mg tablet Commonly known as: COZAAR  Take 1 tablet (100 mg total) by mouth daily.  90 tablet  3  September 22, 2023    magnesium  oxide 400 mg (241 mg magnesium ) Tab  Take 2 tablets (800 mg total) by mouth 2 (two) times a day.   0     omeprazole 20 mg DR capsule Commonly known as: PriLOSEC  Take 1 capsule (20 mg total) by mouth in the morning.  90 capsule  0  September 22, 2023    ondansetron  4 mg disintegrating tablet Commonly known as: ZOFRAN -ODT  Dissolve 1 tablet (4 mg total) on tongue every 6 (six) hours as needed for nausea or vomiting.  20 tablet  0     oxyCODONE  2.5 mg split tablet Commonly known as: ROXICODONE   Take 1 split tablet (2.5 mg total) by mouth every 4 (four) hours as needed for moderate pain (4-6) or severe pain (7-10).  10 tablet  0     sodium chloride  1 gram tablet  Take 2 tablets (2 g total) by mouth in the morning and 2 tablets (2 g total) at noon and 2 tablets (2 g total) in the evening. Take with meals.  180 tablet  2     thiamine  100 mg tablet Commonly known as: VITAMIN B1  Take 1 tablet (100 mg total) by mouth daily.   0  September 22, 2023        Stopped Medications    lansoprazole 15 mg delayed-release capsule Commonly known as: PREVACID   traZODone  50 mg tablet Commonly known as: DESYREL        Discharge Orders     Ambulatory referral to Infectious Disease     Ambulatory referral to Nephrology      Ambulatory referral to PCP     Ambulatory referral to Physical Therapy     Details:    Service Requested: Non-Surgical Evaluation   Comments: Occupational or physical therapists may be utilized unless otherwise indicated here   Ambulatory referral to Physical Therapy     Details:    Service Requested: Non-Surgical Evaluation   Special Instruction:  Balance/Gait General     Comments: Occupational or physical therapists may be utilized unless otherwise indicated here   Full Code     Lifting Limits:     Details:    Lifting Limits: No lifting limits   Regular diet     Details:    Diet type: Regular  Additional instructions:   This patient is currently using a home medication (Odefsey ) that needs to be returned to them prior to discharge.       Occupational Therapy Recommendations: Rehab Potential: Good Impairments/Limitations: Activity Tolerance Deficits, Safety Awareness deficits, Basic Activity of Daily living deficits, Cognitive Deficits, Mobility deficits, Coordination Deficits, Muscle weakness, Decreased knowledge of condition, Pain limiting function   Physical Therapy Recommendations: Rehabilitation Facility     Lab Results  Component Value Date/Time   HGB 10.6 (L) 09/20/2023 09:15 AM   HCT 31.1 (L) 09/20/2023 09:15 AM   WBC 5.28 09/20/2023 09:15 AM   PLT 357 09/20/2023 09:15 AM   Lab Results  Component Value Date/Time   NA 131 (L) 09/20/2023 09:15 AM   K 3.6 09/20/2023 09:15 AM   CREATININE 0.47 (L) 09/20/2023 09:15 AM   BUN 6 (L) 09/20/2023 09:15 AM   GLUCOSE 120 (H) 09/20/2023 09:15 AM    Pertinent Imaging: CT Abdomen Pelvis W Contrast  Final Result by Delmar Herbert Winfred Booker Results In [place] [id number] (07/31 0609)  EXAM:  CT ABDOMEN AND PELVIS WITH CONTRAST  09/03/2023 04:03:30 AM    TECHNIQUE:  CT of the abdomen and pelvis was performed with the administration of  intravenous contrast. Multiplanar reformatted images are provided for   review.  Automated  exposure control, iterative reconstruction, and/or weight based  adjustment of the mA/kV was utilized to reduce the radiation dose to  as   low as  reasonably achievable.    COMPARISON:  MRI of the abdomen from 05/02/2020.    CLINICAL HISTORY:  Abdominal pain, acute, nonlocalized.    FINDINGS:    LOWER CHEST:  Progressive Bilateral posterior lower lobe atelectasis versus airspace   disease.  No pleural effusion identified. Ground glass attenuating nodule within the  previous 7 mm nodule within the left upper lobe is blurred by motion   artifact  image 5/5.    LIVER:  Subjective hepatic steatosis. No suspicious liver abnormality.    GALLBLADDER AND BILE DUCTS:  Gallbladder appears decompressed with gallbladder wall enhancement and   mild  thickening measuring up to 4 mm image 33/2. The common bile duct is   prominent,  measuring up to 7 mm in thickness. No calcified common bile duct stones or  significant intrahepatic bile duct dilatation.    SPLEEN:  No acute abnormality.    PANCREAS:  No acute abnormality.    ADRENAL GLANDS:  No acute abnormality.    KIDNEYS, URETERS AND BLADDER:  No stones in the kidneys or ureters. No hydronephrosis. No perinephric or  periureteral stranding. Foley catheter identified within the urinary   bladder.  Gas within the lumen of the bladder is likely related to instrumentation.    GI AND BOWEL:  The appendix is visualized and appears normal. No pathologic dilatation of   the  large or small bowel loops. No bowel wall thickening.    PERITONEUM AND RETROPERITONEUM:  No free fluid or fluid collections. Small fat-containing umbilical hernia.   No  signs of pneumoperitoneum.    VASCULATURE:  Mild aortic atherosclerosis.    LYMPH NODES:  No abdominal or pelvic adenopathy.    REPRODUCTIVE ORGANS:  Posterior myometrial calcification likely related to degenerative fibroid.   No  adnexal mass.    BONES AND SOFT TISSUES:  No acute  osseous abnormality. No focal soft tissue abnormality.    IMPRESSION:  1. No definite acute findings identified to account for the patient's   abdominal  pain.  2. . Bilateral posterior lower lobe consolidation/atelectasis which   appears  progressive compared with 09/02/23 Correlate for any clinical signs or   symptoms  of pneumonia or aspiration.  3. Decompressed gallbladder Gallbladder wall enhancement and mild   thickening,  likely related to incomplete distention. Correlate for any clinical signs   or  symptoms of acute cholecystitis.  4. prominent common bile duct measuring up to 7 mm. No calcified common   bile  duct stones or significant intrahepatic bile duct dilatation.  5. Aortic atherosclerotic calcification.    Electronically signed by: Waddell Calk MD 09/03/2023 06:09 AM EDT RP  Workstation: [id number]      CT Head WO Contrast  Final Result by Franky Marcey Crease, MD (07/30 1729)  CLINICAL DATA:  Mental status change, unknown cause    EXAM:  CT HEAD WITHOUT CONTRAST    TECHNIQUE:  Contiguous axial images were obtained from the base of the skull  through the vertex without intravenous contrast.    RADIATION DOSE REDUCTION: This exam was performed according to the  departmental dose-optimization program which includes automated  exposure control, adjustment of the mA and/or kV according to  patient size and/or use of iterative reconstruction technique.    COMPARISON:  None Available.    FINDINGS:  Brain: No acute intracranial abnormality. Specifically, no  hemorrhage, hydrocephalus, mass lesion, acute infarction, or  significant intracranial injury.    Vascular: No hyperdense  vessel or unexpected calcification.    Skull: No acute calvarial abnormality.    Sinuses/Orbits: No acute findings    Other: None    IMPRESSION:  No acute intracranial abnormality.      Electronically Signed    By: Franky Crease M.D.    On: 09/02/2023 17:29      CT Chest  WO Contrast  Final Result by [employer] Results In [place] [id number] (07/30 1730)  CLINICAL DATA:  History of HIV. Not on meds. Weight loss. Abnormal  chest radiograph. Generalized weakness for the last week.    EXAM:  CT CHEST WITHOUT CONTRAST    TECHNIQUE:  Multidetector CT imaging of the chest was performed following the  standard protocol without IV contrast.    RADIATION DOSE REDUCTION: This exam was performed according to the  departmental dose-optimization program which includes automated  exposure control, adjustment of the mA and/or kV according to  patient size and/or use of iterative reconstruction technique.    COMPARISON:  Chest radiograph 09/02/2023 and 03/18/2021    FINDINGS:  Cardiovascular: No significant vascular findings. Normal heart size.  No pericardial effusion.    Mediastinum/Nodes: No enlarged mediastinal or axillary lymph nodes.  Thyroid gland, trachea, and esophagus demonstrate no significant  findings.    Lungs/Pleura: Emphysematous changes and scarring in the lung apices  with scattered bullous changes in the anterior lungs. Focal linear  scarring in the mid lungs. Patchy airspace infiltrates in both lung  bases. This may represent edema or pneumonia. No pleural effusion or  pneumothorax. Several scattered pulmonary nodules are present. Most  prominent is in the right middle lung, series 4, image 56, measuring  2.5 mm diameter.    Upper Abdomen: No acute abnormalities.    Musculoskeletal: No chest wall mass or suspicious bone lesions  identified.    IMPRESSION:  1. Scattered emphysematous changes and scarring in the lungs.  2. Linear scarring in the lung bases likely corresponds to chest  radiographic abnormality.  3. Airspace infiltrates in both lung bases may represent edema or  pneumonia.  4. 2.5 mm right middle lung nodule. Scattered smaller nodules. No  follow-up needed if patient is low-risk (and has no known or  suspected primary  neoplasm). Non-contrast chest CT can be considered  in 12 months if patient is high-risk. This recommendation follows  the consensus statement: Guidelines for Management of Incidental  Pulmonary Nodules Detected on CT Images: From the Fleischner Society  2017; Radiology 2017; 284:228-243.      Electronically Signed    By: Elsie Gravely M.D.    On: 09/02/2023 17:30      XR Chest 1 View  Final Result by Delmar Herbert Winfred Booker Results In [place] [id number] (07/30 1440)  EXAM:  1 VIEW XRAY OF THE CHEST  09/02/2023 02:31:00 PM    COMPARISON:  03/18/2021    CLINICAL HISTORY:  Weakness.    FINDINGS:    LUNGS AND PLEURA:  New linear opacity within the right lower lung compatible with atelectasis   or  scar. No pulmonary edema. No pleural effusion. No pneumothorax.    HEART AND MEDIASTINUM:  No acute abnormality of the cardiac and mediastinal silhouettes.    BONES AND SOFT TISSUES:  No acute osseous abnormality.    IMPRESSION:  1. New linear opacity within the right lower lung compatible with   atelectasis  or scar.    Electronically signed by: Waddell Calk MD 09/02/2023 02:40 PM EDT RP  Workstation: [id number]  Electronically signed by: Jama Rome Louder, MD 09/21/2023 10:29 AM   Time spent on discharge: 35 minutes

## 2023-09-21 NOTE — Telephone Encounter (Signed)
 1st attempt to contact patient in regards to scheduling a hosp f/u appt with female named Curlee stating I have the wrong number.  Call placed to daughter, Farrel, who stated new phone number is  [phone].  Triage readack and confirmed number. Chasity confirmed readback.  1st attempt to contact patient in regards to scheduling a hosp f/u appt with recording received stating number is not accepting calls at this time.  Message to ID Team. FYI

## 2023-09-21 NOTE — Progress Notes (Signed)
 [place] [facility]   INFECTIOUS DISEASE INPATIENT PROGRESS NOTE ([place])   Julia Molina Date of Birth: [birth year] MRN: [id number] Patient seen and examined by me on 09/21/2023      Reason for follow up: HIV/Pneumonia    Assessment: HIV noncompliant with medication -CD4 count 204 viral load 478,000 (7/31) - Last seen by infectious disease November 2023 and was noted to have undetectable viral load with a CD4 count of 818 on [place]  -last prescription filled 05/26/23   Fever Bilateral lower lobe pneumonia -No concern for PJP at this time with CD4 count above 200.. -respiratory culture growing candida albicans, normal respiratory flora  Leukocytosis, improved Hyponatremia, improved Altered mental status, improved -cryptococcus antigen negative   Plan: [name] is being seen due to HIV status with pneumonia. Has completed a total of 14 days of antibiotics and has  been afebrile. Sputum culture with normal respiratory flora Pending discharge home today Will schedule a follow up appointment for HIV management in the [facility] clinic    The plan of care was formulated by myself and Dr Donavan who is in agreement with the above outlined plan  Please note that those non-infectious problems listed above as active problems directly impact the patient's risk for infection, ability to respond to treatment and clear infections, or impact the selection or dosing of antibiotics, or are directly influenced by infection, treatment of infection, or relate to monitoring of infection and/or potential toxicities of antimicrobial therapy.  Subjective  Subjective/Interval History: Patient is seen resting in bed, remains on room air. She is awake and alert today and reports improvement in pain today. Pending discharge home  Medications: Current antibiotics/Antibiotic history: Drug: Start Date: End date: Duration/Other:                        Review of system: No fever,  No SOB, + cough, No chest pain, No diarrhea. No skin rash. No urinary symptoms.   Objective  Vital signs last 24 hours:  Temp:  [97.5 F (36.4 C)-98.7 F (37.1 C)] 98.7 F (37.1 C) Heart Rate:  [93-99] 96 Resp:  [18-20] 18 BP: (139-163)/(96-109) 151/100 Temp (24hrs), Avg:98 F (36.7 C), Min:97.5 F (36.4 C), Max:98.7 F (37.1 C)    Physical Exam: Vitals reviewed.  Constitutional:      Appearance: Normal appearance.     Comments:  HENT:     Head: Normocephalic and atraumatic.   Eyes:     Extraocular Movements: Extraocular movements intact.   Cardiovascular:     Rate and Rhythm: Regular rhythm. Tachycardia present.     Heart sounds: Normal heart sounds.  Pulmonary:     Effort: Pulmonary effort is normal. No respiratory distress.     Breath sounds:Rales to the left lower lobe Abdominal:     Palpations: Abdomen is soft.     Tenderness: There is no abdominal tenderness  Skin:    General: Skin is warm and dry.    Neurological:         Comments: Awake alert and oriented.        Labs: Recent Labs    09/19/23 0614 09/20/23 0915  WBC 6.28 5.28  HGB 11.1* 10.6*  HCT 32.2* 31.1*  PLT 338 357   Recent Labs    09/19/23 0614 09/20/23 0915  NA 129* 131*  K 4.1 3.6  CL 100 100  CO2 22 22  BUN 5* 6*  CREATININE 0.38* 0.47*  CALCIUM  8.5* 9.0  GLUCOSE 91 120*    Lab Results  Component Value Date   COLORU Yellow 09/12/2023   SPECGRAV 1.031 (H) 09/12/2023   GLUCOSEU Negative 09/12/2023   BLOODU Negative 09/12/2023   PHUR 7.0 09/12/2023   BILIRUBINUR Negative 09/12/2023   UROBILINOGEN Normal 09/12/2023   RBCU 3-5 (A) 09/12/2023   WBCU 0-5 09/12/2023     Microbiology:  @MICROLAST48H @   Imaging ( personally reviewed by me):   @IMAGESENCORD @  @IMAGINGLPG1DAY @ XR Chest 1 View Result Date: 09/12/2023 CLINICAL DATA:  Fever. EXAM: CHEST  1 VIEW COMPARISON:  Radiograph and CT 09/02/2023 FINDINGS: Progression and patchy opacity in the left lower lung  zone. Minimal subsegmental atelectasis/scarring in the right middle lobe. Overall low lung volumes. Stable heart size and mediastinal contours. No significant pleural effusion. No pneumothorax.   Progression of patchy opacity in the left lower lung zone, suspicious for pneumonia. Electronically Signed   By: Andrea Gasman M.D.   On: 09/12/2023 16:53   CT Angio Head And Neck Result Date: 09/07/2023 EXAM: CTA HEAD AND NECK WITH 09/07/2023 06:43:32 PM TECHNIQUE: CTA of the head and neck was performed with the administration of intravenous contrast. Multiplanar 2D and/or 3D reformatted images are provided for review. Automated exposure control, iterative reconstruction, and/or weight based adjustment of the mA/kV was utilized to reduce the radiation dose to as low as reasonably achievable. Stenosis of the internal carotid arteries measured using NASCET criteria. COMPARISON: CT head dated 11/03/2023 CLINICAL HISTORY: Neuro deficit, acute, stroke suspected. Patient unable to hold still due to confusion despite positioning aids. Scan and contrast injection repeated. FINDINGS: CTA NECK: AORTIC ARCH AND ARCH VESSELS: No dissection or arterial injury. No significant stenosis of the brachiocephalic or subclavian arteries. CERVICAL CAROTID ARTERIES: Minimal atherosclerosis at the right carotid bifurcation without stenosis. Mild tortuosity of the right cervical ICA. Additional minimal atherosclerosis at the left carotid bifurcation without stenosis. Mild tortuosity of the left cervical ICA. No dissection or arterial injury. CERVICAL VERTEBRAL ARTERIES: No dissection, arterial injury, or significant stenosis. LUNGS AND MEDIASTINUM: Paraseptal emphysema in the lung apices. SOFT TISSUES: No acute abnormality. BONES: No acute abnormality. CTA HEAD: ANTERIOR CIRCULATION: The intracranial internal carotid arteries are patent bilaterally. Atherosclerosis of the carotid siphons. Mild stenosis of the right cavernous ICA. The MCAs  are patent bilaterally. The ACAs are patent bilaterally. No aneurysm. POSTERIOR CIRCULATION: No significant stenosis of the posterior cerebral arteries. No significant stenosis of the basilar artery. No significant stenosis of the vertebral arteries. No aneurysm. OTHER: No dural venous sinus thrombosis on this non-dedicated study.   1. No large vessel occlusion or hemodynamically significant stenosis in the head or neck. 2. Mild stenosis of the right cavernous ICA. Electronically signed by: Donnice Mania MD 09/07/2023 07:31 PM EDT RP Workstation: [id number]   CT Abdomen Pelvis W Contrast Result Date: 09/03/2023 EXAM: CT ABDOMEN AND PELVIS WITH CONTRAST 09/03/2023 04:03:30 AM TECHNIQUE: CT of the abdomen and pelvis was performed with the administration of intravenous contrast. Multiplanar reformatted images are provided for review. Automated exposure control, iterative reconstruction, and/or weight based adjustment of the mA/kV was utilized to reduce the radiation dose to as low as reasonably achievable. COMPARISON: MRI of the abdomen from 05/02/2020. CLINICAL HISTORY: Abdominal pain, acute, nonlocalized. FINDINGS: LOWER CHEST: Progressive Bilateral posterior lower lobe atelectasis versus airspace disease. No pleural effusion identified. Ground glass attenuating nodule within the previous 7 mm nodule within the left upper lobe is blurred by motion artifact image 5/5. LIVER: Subjective hepatic steatosis.  No suspicious liver abnormality. GALLBLADDER AND BILE DUCTS: Gallbladder appears decompressed with gallbladder wall enhancement and mild thickening measuring up to 4 mm image 33/2. The common bile duct is prominent, measuring up to 7 mm in thickness. No calcified common bile duct stones or significant intrahepatic bile duct dilatation. SPLEEN: No acute abnormality. PANCREAS: No acute abnormality. ADRENAL GLANDS: No acute abnormality. KIDNEYS, URETERS AND BLADDER: No stones in the kidneys or ureters. No  hydronephrosis. No perinephric or periureteral stranding. Foley catheter identified within the urinary bladder. Gas within the lumen of the bladder is likely related to instrumentation. GI AND BOWEL: The appendix is visualized and appears normal. No pathologic dilatation of the large or small bowel loops. No bowel wall thickening. PERITONEUM AND RETROPERITONEUM: No free fluid or fluid collections. Small fat-containing umbilical hernia. No signs of pneumoperitoneum. VASCULATURE: Mild aortic atherosclerosis. LYMPH NODES: No abdominal or pelvic adenopathy. REPRODUCTIVE ORGANS: Posterior myometrial calcification likely related to degenerative fibroid. No adnexal mass. BONES AND SOFT TISSUES: No acute osseous abnormality. No focal soft tissue abnormality.   1. No definite acute findings identified to account for the patient's abdominal pain. 2. . Bilateral posterior lower lobe consolidation/atelectasis which appears progressive compared with 09/02/23 Correlate for any clinical signs or symptoms of pneumonia or aspiration. 3. Decompressed gallbladder Gallbladder wall enhancement and mild thickening, likely related to incomplete distention. Correlate for any clinical signs or symptoms of acute cholecystitis. 4. prominent common bile duct measuring up to 7 mm. No calcified common bile duct stones or significant intrahepatic bile duct dilatation. 5. Aortic atherosclerotic calcification. Electronically signed by: Waddell Calk MD 09/03/2023 06:09 AM EDT RP Workstation: [id number]   CT Chest WO Contrast Result Date: 09/02/2023 CLINICAL DATA:  History of HIV. Not on meds. Weight loss. Abnormal chest radiograph. Generalized weakness for the last week. EXAM: CT CHEST WITHOUT CONTRAST TECHNIQUE: Multidetector CT imaging of the chest was performed following the standard protocol without IV contrast. RADIATION DOSE REDUCTION: This exam was performed according to the departmental dose-optimization program which includes  automated exposure control, adjustment of the mA and/or kV according to patient size and/or use of iterative reconstruction technique. COMPARISON:  Chest radiograph 09/02/2023 and 03/18/2021 FINDINGS: Cardiovascular: No significant vascular findings. Normal heart size. No pericardial effusion. Mediastinum/Nodes: No enlarged mediastinal or axillary lymph nodes. Thyroid gland, trachea, and esophagus demonstrate no significant findings. Lungs/Pleura: Emphysematous changes and scarring in the lung apices with scattered bullous changes in the anterior lungs. Focal linear scarring in the mid lungs. Patchy airspace infiltrates in both lung bases. This may represent edema or pneumonia. No pleural effusion or pneumothorax. Several scattered pulmonary nodules are present. Most prominent is in the right middle lung, series 4, image 56, measuring 2.5 mm diameter. Upper Abdomen: No acute abnormalities. Musculoskeletal: No chest wall mass or suspicious bone lesions identified.   1. Scattered emphysematous changes and scarring in the lungs. 2. Linear scarring in the lung bases likely corresponds to chest radiographic abnormality. 3. Airspace infiltrates in both lung bases may represent edema or pneumonia. 4. 2.5 mm right middle lung nodule. Scattered smaller nodules. No follow-up needed if patient is low-risk (and has no known or suspected primary neoplasm). Non-contrast chest CT can be considered in 12 months if patient is high-risk. This recommendation follows the consensus statement: Guidelines for Management of Incidental Pulmonary Nodules Detected on CT Images: From the Fleischner Society 2017; Radiology 2017; 284:228-243. Electronically Signed   By: Elsie Gravely M.D.   On: 09/02/2023 17:30   CT Head WO Contrast  Result Date: 09/02/2023 CLINICAL DATA:  Mental status change, unknown cause EXAM: CT HEAD WITHOUT CONTRAST TECHNIQUE: Contiguous axial images were obtained from the base of the skull through the vertex  without intravenous contrast. RADIATION DOSE REDUCTION: This exam was performed according to the departmental dose-optimization program which includes automated exposure control, adjustment of the mA and/or kV according to patient size and/or use of iterative reconstruction technique. COMPARISON:  None Available. FINDINGS: Brain: No acute intracranial abnormality. Specifically, no hemorrhage, hydrocephalus, mass lesion, acute infarction, or significant intracranial injury. Vascular: No hyperdense vessel or unexpected calcification. Skull: No acute calvarial abnormality. Sinuses/Orbits: No acute findings Other: None   No acute intracranial abnormality. Electronically Signed   By: Franky Crease M.D.   On: 09/02/2023 17:29   XR Chest 1 View Result Date: 09/02/2023 EXAM: 1 VIEW XRAY OF THE CHEST 09/02/2023 02:31:00 PM COMPARISON: 03/18/2021 CLINICAL HISTORY: Weakness. FINDINGS: LUNGS AND PLEURA: New linear opacity within the right lower lung compatible with atelectasis or scar. No pulmonary edema. No pleural effusion. No pneumothorax. HEART AND MEDIASTINUM: No acute abnormality of the cardiac and mediastinal silhouettes. BONES AND SOFT TISSUES: No acute osseous abnormality.   1. New linear opacity within the right lower lung compatible with atelectasis or scar. Electronically signed by: Waddell Calk MD 09/02/2023 02:40 PM EDT RP Workstation: [id number]   ECG 12 lead Result Date: 09/02/2023 Ventricular Rate                   115       BPM                 Atrial Rate                        115       BPM                 P-R Interval                       146       ms                  QRS Duration                       70        ms                  Q-T Interval                       340       ms                  QTC Calculation Bazett             470       ms                  Calculated P Axis                  54        degrees             Calculated R Axis                  -2        degrees             Calculated  T Axis                  57        degrees             Sinus tachycardia Otherwise normal ECG No previous ECGs available Confirmed by Rojelio Dunnings  [phone]  on 09-02-2023 2 29 52 PM  Length of stay 19 days   I personally reviewed patients records (including a review of problem list, current meds), labs and other diagnostic testing, imaging and any consultant notes  Rosina HERO. Mercer, MSN, FNP-BC [facility] Infectious Diseases, [place]       This record has been created using Conservation officer, historic buildings.

## 2023-09-21 NOTE — Nursing Note (Signed)
 Pt transferred to room 515 via wheelchair by CNA staff. Cell phone, clothing and other misc belongings collected. Report given to [place]. Pt states she'd update her daughter on the new room assignment.

## 2023-09-21 NOTE — Consults (Signed)
 Nutrition Note   PATIENT NAME: Julia Molina DATE: 09/21/2023  TIME: 9:57 AM  Note Type: Follow-up Referral Reason: Nutrition consult, MST 3  Assessment  8/18: Pt reported appetite improved and consuming supplements provided.   No nausea,vomiting,abdomnal pain,abdominal bloating noted.     8/11: Pt reported fair appetite and consuming supplements provided. No nausea,vomiting,abdomnal pain,abdominal bloating noted.      8/5: Attempted to speak to pt . However, she repeatedly commented " is this hospice." Pt with poor PO intake per nursing documentation. No nausea, vomiting noted. Pt with diarrhea the past few days per chart. No edema noted.    Nutrition Assessment: 8 YOF with a PMH of HIV, noncompliance with medication, and chronic alcohol use presented with failure to thrive, poor p.o. intake, generalized weakness, and bedbound for past 2 weeks.   Spoke with daughter at bedside. Patient resting most of the time, able to nod or shake head. Daughter reports that patient has lost about 10-20 lbs in the past few weeks. She endorses her mother at a much higher weight than what it is now usually, but unsure of the exact number. She also reports poor appetite and PO intake for about 2 weeks PTA, especially since coming to the hospital. Daughter has to encourage patient to eat bites and sip liquids. She does better with liquids. Interested in trying [employer] as well as Ensure Plus. RD to add Magic Cup BID also.   Given depletions and PO intake decrease, patient meets criteria for severe acute malnutrition. Patient likely moderately malnourished at baseline.   Interventions  Nutrition Intervention: Collaboration and referral of nutrition care, Medical food supplements, Vitamin and/or mineral supplements  Continue regular diet as ordered Continue Ensure Plus TID and Magic Cup BID Encouraged PO intake Consider appetite stimulant   Continue multivitamins and thiamine  as ordered Recommend monitor of  lytes and replete as indicated       Nutrition Diagnosis  Severe protein-calorie malnutrition POA  Malnutrition Etiology: Acute illness or injury  Problem Related To: Decreased appetite, Acute illness, Failure to thrive     Energy Intake: < or equal to 50% of est energy needs for > or equal to 5 days Muscle Mass Depletion Summary: Mostly severe depletions Body Fat Depletion Summary: Mostly severe depletions   Nutrition Diagnosis: Increased nutrient needs Problem Related To: Chronic illness Problem as Evidenced By: estimated needs   Nutrition Diagnosis 2: Inadequate oral intake Problem Related To 2: Decreased appetite Problem as Evidenced By: 0%-25% of meals   Nutrition Focused Physical Findings  NFPE Performed Date: 09/04/23 Signs of Muscle Wasting, Fat Loss or Micronutrient Deficiencies: Yes  Muscle Mass Depletion: Temporalis: Severe Pectoralis: Severe Deltoids: Severe Scapula Region: Severe Interosseous: Severe Quadriceps: Moderate Gastrocnemius: Moderate   Body Fat Depletion: Orbital Region: Severe Buccal Region: Severe Triceps Region: Severe Ribs: Moderate            Pertinent Information   Weights (last 14 days)     Date/Time Weight   09/16/23 0600 56.4 kg (124 lb 5.4 oz)   09/15/23 0537 57 kg (125 lb 10.6 oz)   09/07/23 0400 61.7 kg (136 lb 0.4 oz)      Weights (last 14 days)       Date/Time Weight    09/07/23 0400 61.7 kg (136 lb 0.4 oz)    09/06/23 0531 59.6 kg (131 lb 6.3 oz)    09/02/23 1600 57.9 kg (127 lb 11.2 oz)    09/02/23 1419 72.6 kg (160 lb)  Wt Readings from Last 20 Encounters:  09/16/23 56.4 kg (124 lb 5.4 oz)    Weight fluctuating.   Weight History/Comments: . Weight Classification: Normal BMI: 20.07 Skin Integrity: No pressure injuries noted Nutrition Related Medications: COREG,lovenox, folic acid ,COZAAR,magnesium  oxide,omeprazole,sodium chloride  tablet,therapeutic multivitamin,thiamine  Labs: Na:131,Glucose:120,Mg:1.5   Last BM  Date: 09/20/23    Current Diet and Nutrition Support Details  Diet Regular - 0%-100%, Ensure Plus x3, 0-257 mL Ensure Plus TID, Magic Cup BID  Enteral Nutrition                 Parenteral Nutrition           Total Regimen Provides      Estimated Needs  Nutrition Needs Based On: Actual weight (56.4 kg)    Calories (kcal/day): 8307-8025  Calories based on: 30-35 kcal/kg  Protein (g/day): 68-85  Protein based on: 1.2-1.5g/kg  Fluid (mL/day):   1-1.1 mL/kcal, Per MD      Monitoring & Evaluation  Nutrition Goals: Adequate PO, GI tolerance, Minimize nutrition impact symptoms, Promote weight gain, Other (comment), Appropriate glucose control (Labs) Nutrition Goal Status: Progressing, continue   Follow-up Information  Follow-Up Date: 09/30/23 Follow-Up Reason: Reassessment   Contact RD on treatment team. After hours or weekends, use secure chat group: [facility]  [facility] Adult Dietitians [place]  [facility] Dietitians [facility]  [facility] Dietitians [facility]  [facility] Dietitians [facility]  [facility] Dietitians   Marvis Killian, RD 09/21/2023 9:57 AM

## 2023-09-21 NOTE — Progress Notes (Signed)
 [facility] Nephrology Progress Note  Follow up for chronic hyponatremia.  Hospital Day: 19  Subjective: Interval History: Sitting up in the chair, awake, on alert.   Blood pressure this morning elevated. Tolerated ambulation with PT.   Review of Systems: negative except as noted above  Objective:   Vital signs for last 24 hours: Temp:  [97.5 F (36.4 C)-98.7 F (37.1 C)] 98.7 F (37.1 C) Heart Rate:  [93-101] 101 Resp:  [18-20] 18 BP: (128-163)/(94-109) 128/94 Today's weight: 56.4 kg (124 lb 5.4 oz)  Intake/Output last 24 hours:  Intake/Output Summary (Last 24 hours) at 09/21/2023 1444 Last data filed at 09/21/2023 1055 Gross per 24 hour  Intake 1230 ml  Output --  Net 1230 ml    Medications: Scheduled Meds: emtricitab-rilpivir-tenofo ala, 1 tablet, oral, Daily carvediloL, 12.5 mg, oral, BID with meals enoxaparin, 40 mg, subcutaneous, At Bedtime folic acid , 1 mg, oral, Daily iohexoL, 80 mL, intravenous, Once in imaging lidocaine, 1 patch, topical, Daily losartan, 100 mg, oral, Daily magnesium  oxide, 400 mg, oral, BID omeprazole, 20 mg, oral, Daily 0600 sodium chloride , 2 g, oral, TID with meals multivitamin, 1 tablet, oral, Daily thiamine , 100 mg, oral, Daily    PRN Meds: .  acetaminophen  .  benzonatate  .  dextrose .  dextrose .  hydrALAZINE  .  LORazepam  .  melatonin .  ondansetron  .  ondansetron  .  oxyCODONE    Physical Exam: General appearance: Awake and alert. Head: normocephalic, atraumatic Eyes: Sclera anicteric, no conjunctival pallor Lungs: Decreased bilateral breath sounds present. Heart: s1s2 present, regular rate and rhythm, no murmurs Abdomen: is flat, soft, no tenderness, bowel sounds present. Extremities: extremities normal, atraumatic, no cyanosis or edema Skin: Skin color, texture, turgor normal. No rashes or lesions Neurologic: Awake, alert and responding to commands appropriately.   Labs Recent Results (from the past 48 hours)   Basic Metabolic Panel   Collection Time: 09/20/23  9:15 AM  Result Value Ref Range   Sodium 131 (L) 136 - 145 mmol/L   Potassium 3.6 3.4 - 4.5 mmol/L   Chloride 100 98 - 107 mmol/L   CO2 22 21 - 31 mmol/L   Anion Gap 9 6 - 14 mmol/L   Glucose, Random 120 (H) 70 - 99 mg/dL   Blood Urea Nitrogen (BUN) 6 (L) 7 - 25 mg/dL   Creatinine 9.52 (L) 9.39 - 1.20 mg/dL   eGFR >09 >40 fO/fpw/8.26f7   Calcium  9.0 8.6 - 10.3 mg/dL   BUN/Creatinine Ratio 12.8 10.0 - 20.0  Magnesium    Collection Time: 09/20/23  9:15 AM  Result Value Ref Range   Magnesium  1.5 (L) 1.9 - 2.7 mg/dL  CBC without Differential   Collection Time: 09/20/23  9:15 AM  Result Value Ref Range   WBC 5.28 4.40 - 11.00 10*3/uL   RBC 3.49 (L) 4.10 - 5.10 10*6/uL   Hemoglobin 10.6 (L) 12.3 - 15.3 g/dL   Hematocrit 68.8 (L) 64.0 - 44.6 %   Mean Corpuscular Volume (MCV) 89.2 80.0 - 96.0 fL   Mean Corpuscular Hemoglobin (MCH) 30.3 27.5 - 33.2 pg   Mean Corpuscular Hemoglobin Conc (MCHC) 34.0 33.0 - 37.0 g/dL   Red Cell Distribution Width (RDW) 13.9 12.3 - 17.0 %   Platelet Count (PLT) 357 150 - 450 10*3/uL   Mean Platelet Volume (MPV) 7.6 6.8 - 10.2 fL     Imaging: Radiology Results (last 21 days)     Procedure Component Value Units Date/Time   CT Abdomen Pelvis W Contrast [[id number]] Collected:  09/03/23 0559   Order Status: Completed Updated: 09/03/23 0611   Narrative:     EXAM: CT ABDOMEN AND PELVIS WITH CONTRAST 09/03/2023 04:03:30 AM  TECHNIQUE: CT of the abdomen and pelvis was performed with the administration of intravenous contrast. Multiplanar reformatted images are provided for review. Automated exposure control, iterative reconstruction, and/or weight based adjustment of the mA/kV was utilized to reduce the radiation dose to as low as reasonably achievable.  COMPARISON: MRI of the abdomen from 05/02/2020.  CLINICAL HISTORY: Abdominal pain, acute, nonlocalized.  FINDINGS:  LOWER CHEST: Progressive  Bilateral posterior lower lobe atelectasis versus airspace disease. No pleural effusion identified. Ground glass attenuating nodule within the previous 7 mm nodule within the left upper lobe is blurred by motion artifact image 5/5.  LIVER: Subjective hepatic steatosis. No suspicious liver abnormality.  GALLBLADDER AND BILE DUCTS: Gallbladder appears decompressed with gallbladder wall enhancement and mild thickening measuring up to 4 mm image 33/2. The common bile duct is prominent, measuring up to 7 mm in thickness. No calcified common bile duct stones or significant intrahepatic bile duct dilatation.  SPLEEN: No acute abnormality.  PANCREAS: No acute abnormality.  ADRENAL GLANDS: No acute abnormality.  KIDNEYS, URETERS AND BLADDER: No stones in the kidneys or ureters. No hydronephrosis. No perinephric or periureteral stranding. Foley catheter identified within the urinary bladder. Gas within the lumen of the bladder is likely related to instrumentation.  GI AND BOWEL: The appendix is visualized and appears normal. No pathologic dilatation of the large or small bowel loops. No bowel wall thickening.  PERITONEUM AND RETROPERITONEUM: No free fluid or fluid collections. Small fat-containing umbilical hernia. No signs of pneumoperitoneum.  VASCULATURE: Mild aortic atherosclerosis.  LYMPH NODES: No abdominal or pelvic adenopathy.  REPRODUCTIVE ORGANS: Posterior myometrial calcification likely related to degenerative fibroid. No adnexal mass.  BONES AND SOFT TISSUES: No acute osseous abnormality. No focal soft tissue abnormality.    Impression:     1. No definite acute findings identified to account for the patient's abdominal pain. 2. . Bilateral posterior lower lobe consolidation/atelectasis which appears progressive compared with 09/02/23 Correlate for any clinical signs or symptoms of pneumonia or aspiration. 3. Decompressed gallbladder Gallbladder wall  enhancement and mild thickening, likely related to incomplete distention. Correlate for any clinical signs or symptoms of acute cholecystitis. 4. prominent common bile duct measuring up to 7 mm. No calcified common bile duct stones or significant intrahepatic bile duct dilatation. 5. Aortic atherosclerotic calcification.  Electronically signed by: Waddell Calk MD 09/03/2023 06:09 AM EDT RP Workstation: [id number]    CT Chest WO Contrast [[id number]] Collected: 09/02/23 1725   Order Status: Completed Updated: 09/02/23 1733   Narrative:     CLINICAL DATA:  History of HIV. Not on meds. Weight loss. Abnormal chest radiograph. Generalized weakness for the last week.  EXAM: CT CHEST WITHOUT CONTRAST  TECHNIQUE: Multidetector CT imaging of the chest was performed following the standard protocol without IV contrast.  RADIATION DOSE REDUCTION: This exam was performed according to the departmental dose-optimization program which includes automated exposure control, adjustment of the mA and/or kV according to patient size and/or use of iterative reconstruction technique.  COMPARISON:  Chest radiograph 09/02/2023 and 03/18/2021  FINDINGS: Cardiovascular: No significant vascular findings. Normal heart size. No pericardial effusion.  Mediastinum/Nodes: No enlarged mediastinal or axillary lymph nodes. Thyroid gland, trachea, and esophagus demonstrate no significant findings.  Lungs/Pleura: Emphysematous changes and scarring in the lung apices with scattered bullous changes in the anterior lungs. Focal linear scarring in  the mid lungs. Patchy airspace infiltrates in both lung bases. This may represent edema or pneumonia. No pleural effusion or pneumothorax. Several scattered pulmonary nodules are present. Most prominent is in the right middle lung, series 4, image 56, measuring 2.5 mm diameter.  Upper Abdomen: No acute abnormalities.  Musculoskeletal: No chest wall mass or suspicious  bone lesions identified.    Impression:     1. Scattered emphysematous changes and scarring in the lungs. 2. Linear scarring in the lung bases likely corresponds to chest radiographic abnormality. 3. Airspace infiltrates in both lung bases may represent edema or pneumonia. 4. 2.5 mm right middle lung nodule. Scattered smaller nodules. No follow-up needed if patient is low-risk (and has no known or suspected primary neoplasm). Non-contrast chest CT can be considered in 12 months if patient is high-risk. This recommendation follows the consensus statement: Guidelines for Management of Incidental Pulmonary Nodules Detected on CT Images: From the Fleischner Society 2017; Radiology 2017; 284:228-243.   Electronically Signed   By: Elsie Gravely M.D.   On: 09/02/2023 17:30    CT Head WO Contrast [[id number]] Collected: 09/02/23 1726   Order Status: Completed Updated: 09/02/23 1731   Narrative:     CLINICAL DATA:  Mental status change, unknown cause  EXAM: CT HEAD WITHOUT CONTRAST  TECHNIQUE: Contiguous axial images were obtained from the base of the skull through the vertex without intravenous contrast.  RADIATION DOSE REDUCTION: This exam was performed according to the departmental dose-optimization program which includes automated exposure control, adjustment of the mA and/or kV according to patient size and/or use of iterative reconstruction technique.  COMPARISON:  None Available.  FINDINGS: Brain: No acute intracranial abnormality. Specifically, no hemorrhage, hydrocephalus, mass lesion, acute infarction, or significant intracranial injury.  Vascular: No hyperdense vessel or unexpected calcification.  Skull: No acute calvarial abnormality.  Sinuses/Orbits: No acute findings  Other: None    Impression:     No acute intracranial abnormality.   Electronically Signed   By: Franky Crease M.D.   On: 09/02/2023 17:29    XR Chest 1 View [[id number]] Collected:  09/02/23 1439   Order Status: Completed Updated: 09/02/23 1442   Narrative:     EXAM: 1 VIEW XRAY OF THE CHEST 09/02/2023 02:31:00 PM  COMPARISON: 03/18/2021  CLINICAL HISTORY: Weakness.  FINDINGS:  LUNGS AND PLEURA: New linear opacity within the right lower lung compatible with atelectasis or scar. No pulmonary edema. No pleural effusion. No pneumothorax.  HEART AND MEDIASTINUM: No acute abnormality of the cardiac and mediastinal silhouettes.  BONES AND SOFT TISSUES: No acute osseous abnormality.    Impression:     1. New linear opacity within the right lower lung compatible with atelectasis or scar.  Electronically signed by: Waddell Calk MD 09/02/2023 02:40 PM EDT RP Workstation: [id number]           Assessment/Plan: 1) Hyponatremia-likely multifactorial from decreased solute intake, anorexia, failure to thrive, p.o. fluids.  Reached nadir sodium of 115, trended up to 122 following 3% saline infusion and reached a peak sodium of 135 with dosing of tolvaptan.  Sodium maintained above goal > 130 with use of salt tabs 3 times daily. Continue to encourage p.o. intake and avoid excessive fluids.  2) Failure to thrive-continue supplements, supportive care. 3) Alcohol use disorder- CIWA protocol, monitor for withdrawal symptoms. 4) Community-acquired pneumonia-elevated WBC, restarted on antibiotics 09/12/2023 for possible hospital-acquired pneumonia with improving symptoms.  Completed antibiotics with WBC now within normal range. 5) HIV/AIDS-low CD4 count.  Started  on intravenous emtricitabine , tenofovir ,rilpivir therapy. 6) Hypokalemia/hypomagnesemia-resolved with repletion. 7) Hypertension with diastolic elevation in blood pressure-monitor home BP readings closely with addition of salt tabs. 8) Sepsis-likely due to hospital-acquired pneumonia in the setting of chronic immunosuppression.      Howell King MD, FASN 09/21/2023 2:44 PM

## 2023-09-22 NOTE — Telephone Encounter (Signed)
 2nd attempt to contact patient in regards to scheduling a hosp f/u appt with recording received stating number is not accepting calls at this time.   Message to ID Team. FYI

## 2023-09-23 NOTE — Telephone Encounter (Signed)
 3rd attempt to contact patient in regards to scheduling a hosp f/u appt with recording received stating number is not accepting calls at this time.  Call placed to daughter, Farrel, who stated new phone number is [phone].  Call placed to patient (two pt identifiers used) to schedule appt.   Appt scheduled 10/12/23 1350  Patient verbalized understanding and was thankful for the information.   No other concerns at this time.   Message to A. Wilkin. FYI.

## 2023-10-13 ENCOUNTER — Telehealth: Payer: Self-pay

## 2023-10-13 NOTE — Telephone Encounter (Signed)
 Spoke with Julia Molina at [facility] ID, confirmed that Julia Molina kept her appointment with Dr. Rhett yesterday and is scheduled for follow up with their office in October.   Egor Fullilove, BSN, RN
